# Patient Record
Sex: Female | Born: 1965 | Race: White | Hispanic: No | Smoking: Never smoker
Health system: Southern US, Community
[De-identification: ages and names within clinical notes are randomized; demographics above are authoritative.]

## PROBLEM LIST (undated history)

## (undated) DIAGNOSIS — K219 Gastro-esophageal reflux disease without esophagitis: Secondary | ICD-10-CM

## (undated) DIAGNOSIS — A0472 Enterocolitis due to Clostridium difficile, not specified as recurrent: Secondary | ICD-10-CM

## (undated) DIAGNOSIS — M419 Scoliosis, unspecified: Secondary | ICD-10-CM

## (undated) DIAGNOSIS — M1990 Unspecified osteoarthritis, unspecified site: Secondary | ICD-10-CM

## (undated) DIAGNOSIS — K449 Diaphragmatic hernia without obstruction or gangrene: Secondary | ICD-10-CM

## (undated) DIAGNOSIS — K5790 Diverticulosis of intestine, part unspecified, without perforation or abscess without bleeding: Secondary | ICD-10-CM

## (undated) DIAGNOSIS — F514 Sleep terrors [night terrors]: Secondary | ICD-10-CM

## (undated) HISTORY — DX: Unspecified osteoarthritis, unspecified site: M19.90

## (undated) HISTORY — PX: SPINE SURGERY: SHX786

## (undated) HISTORY — DX: Diaphragmatic hernia without obstruction or gangrene: K44.9

## (undated) HISTORY — DX: Enterocolitis due to Clostridium difficile, not specified as recurrent: A04.72

## (undated) HISTORY — DX: Scoliosis, unspecified: M41.9

## (undated) HISTORY — DX: Gastro-esophageal reflux disease without esophagitis: K21.9

## (undated) HISTORY — DX: Diverticulosis of intestine, part unspecified, without perforation or abscess without bleeding: K57.90

## (undated) HISTORY — DX: Sleep terrors (night terrors): F51.4

## (undated) HISTORY — PX: OTHER SURGICAL HISTORY: SHX169

---
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
## 1985-09-18 HISTORY — PX: APPENDECTOMY: SHX54

## 1985-09-18 HISTORY — PX: ABDOMINAL HYSTERECTOMY: SHX81

## 1985-09-18 HISTORY — PX: OTHER SURGICAL HISTORY: SHX169

## 2001-09-18 DIAGNOSIS — A0472 Enterocolitis due to Clostridium difficile, not specified as recurrent: Secondary | ICD-10-CM

## 2001-09-18 HISTORY — DX: Enterocolitis due to Clostridium difficile, not specified as recurrent: A04.72

## 2006-02-07 ENCOUNTER — Other Ambulatory Visit: Admission: RE | Admit: 2006-02-07 | Discharge: 2006-02-07 | Payer: Self-pay | Admitting: Obstetrics & Gynecology

## 2006-03-27 ENCOUNTER — Encounter: Admission: RE | Admit: 2006-03-27 | Discharge: 2006-03-27 | Payer: Self-pay | Admitting: Obstetrics & Gynecology

## 2006-04-09 ENCOUNTER — Encounter: Admission: RE | Admit: 2006-04-09 | Discharge: 2006-04-09 | Payer: Self-pay | Admitting: Obstetrics & Gynecology

## 2007-02-12 ENCOUNTER — Other Ambulatory Visit: Admission: RE | Admit: 2007-02-12 | Discharge: 2007-02-12 | Payer: Self-pay | Admitting: Obstetrics and Gynecology

## 2007-02-18 ENCOUNTER — Encounter: Admission: RE | Admit: 2007-02-18 | Discharge: 2007-02-18 | Payer: Self-pay | Admitting: Obstetrics & Gynecology

## 2007-07-31 ENCOUNTER — Encounter: Admission: RE | Admit: 2007-07-31 | Discharge: 2007-07-31 | Payer: Self-pay | Admitting: Obstetrics & Gynecology

## 2008-01-14 ENCOUNTER — Other Ambulatory Visit: Admission: RE | Admit: 2008-01-14 | Discharge: 2008-01-14 | Payer: Self-pay | Admitting: Obstetrics & Gynecology

## 2008-08-03 ENCOUNTER — Encounter: Admission: RE | Admit: 2008-08-03 | Discharge: 2008-08-03 | Payer: Self-pay | Admitting: Obstetrics & Gynecology

## 2009-08-04 ENCOUNTER — Encounter: Admission: RE | Admit: 2009-08-04 | Discharge: 2009-08-04 | Payer: Self-pay | Admitting: Obstetrics & Gynecology

## 2010-08-08 ENCOUNTER — Encounter: Admission: RE | Admit: 2010-08-08 | Discharge: 2010-08-08 | Payer: Self-pay | Admitting: Obstetrics & Gynecology

## 2011-07-03 ENCOUNTER — Other Ambulatory Visit: Payer: Self-pay | Admitting: Obstetrics & Gynecology

## 2011-07-03 DIAGNOSIS — Z1231 Encounter for screening mammogram for malignant neoplasm of breast: Secondary | ICD-10-CM

## 2011-08-14 ENCOUNTER — Ambulatory Visit
Admission: RE | Admit: 2011-08-14 | Discharge: 2011-08-14 | Disposition: A | Discharge status: Home / Self Care | Payer: BC Managed Care – PPO | Source: Ambulatory Visit | Attending: Obstetrics & Gynecology | Admitting: Obstetrics & Gynecology

## 2011-08-14 DIAGNOSIS — Z1231 Encounter for screening mammogram for malignant neoplasm of breast: Secondary | ICD-10-CM

## 2012-07-10 ENCOUNTER — Other Ambulatory Visit: Payer: Self-pay | Admitting: Obstetrics & Gynecology

## 2012-07-10 DIAGNOSIS — Z1231 Encounter for screening mammogram for malignant neoplasm of breast: Secondary | ICD-10-CM

## 2012-08-19 ENCOUNTER — Ambulatory Visit
Admission: RE | Admit: 2012-08-19 | Discharge: 2012-08-19 | Disposition: A | Discharge status: Home / Self Care | Payer: BC Managed Care – PPO | Source: Ambulatory Visit | Attending: Obstetrics & Gynecology | Admitting: Obstetrics & Gynecology

## 2012-08-19 DIAGNOSIS — Z1231 Encounter for screening mammogram for malignant neoplasm of breast: Secondary | ICD-10-CM

## 2013-02-01 ENCOUNTER — Other Ambulatory Visit: Payer: Self-pay | Admitting: Obstetrics and Gynecology

## 2013-02-03 NOTE — Telephone Encounter (Signed)
eScribe request for refill on ENPRESS 28 Last filled - 04/03/12, #28 X 10 Last AEX - 05/01/12 Next AEX - not scheduled.  Due in 04/2013 Pt given option at last AEX for 1-2 yr AEX appt.  Per lab report, if pt is continuing OCP she will need AEX appt at one year mark.   Two packs sent to pharmacy for June and July.

## 2013-03-31 ENCOUNTER — Other Ambulatory Visit: Payer: Self-pay | Admitting: Obstetrics & Gynecology

## 2013-03-31 NOTE — Telephone Encounter (Signed)
next aex on 05/15/2013 with MSM. Enpresse-28 tablet/1 refill sent to pharmacy to maintain coverage until aex.

## 2013-03-31 NOTE — Telephone Encounter (Signed)
OK to refill

## 2013-05-15 ENCOUNTER — Encounter: Payer: Self-pay | Admitting: Obstetrics & Gynecology

## 2013-05-15 ENCOUNTER — Ambulatory Visit (INDEPENDENT_AMBULATORY_CARE_PROVIDER_SITE_OTHER): Payer: BC Managed Care – PPO | Admitting: Obstetrics & Gynecology

## 2013-05-15 VITALS — BP 104/64 | HR 64 | Resp 16 | Ht 70.25 in | Wt 189.2 lb

## 2013-05-15 DIAGNOSIS — Z0000 Encounter for general adult medical examination without abnormal findings: Secondary | ICD-10-CM

## 2013-05-15 DIAGNOSIS — Z01419 Encounter for gynecological examination (general) (routine) without abnormal findings: Secondary | ICD-10-CM

## 2013-05-15 LAB — POCT URINALYSIS DIPSTICK
Blood, UA: NEGATIVE
Protein, UA: NEGATIVE
Urobilinogen, UA: NEGATIVE

## 2013-05-15 MED ORDER — LEVONORG-ETH ESTRAD TRIPHASIC PO TABS: ORAL_TABLET | ORAL | Status: DC

## 2013-05-15 NOTE — Patient Instructions (Signed)

## 2013-05-15 NOTE — Progress Notes (Signed)
47 y.o. G2P2 Married CaucasianF here for annual exam.  Regular cycles.  3D MMG discussed.    Patient's last menstrual period was 05/01/2013.          Sexually active: yes  The current method of family planning is OCP (estrogen/progesterone).    Exercising: no  not regularly Smoker:  no  Health Maintenance: Pap:  05/01/12 WNL/negatve HR HPV History of abnormal Pap:  no MMG:  08/19/12 normal Colonoscopy:  none BMD:   none TDaP:  ? Screening Labs: 2010, Hb today: not drawn yet, Urine today: WBC-trace   reports that she has never smoked. She has never used smokeless tobacco. She reports that she drinks about 1.0 ounces of alcohol per week. She reports that she does not use illicit drugs.  Past Medical History  Diagnosis Date  . Scoliosis     Past Surgical History  Procedure Laterality Date  . Herrington rods  1980  . Rso  1987    Current Outpatient Prescriptions  Medication Sig Dispense Refill  . levonorgestrel-ethinyl estradiol (ENPRESSE-28) tablet TAKE 1 TABLET BY MOUTH EVERY DAY  28 tablet  1   No current facility-administered medications for this visit.    Family History  Problem Relation Age of Onset  . Diabetes Maternal Grandfather   . Lung cancer Maternal Grandmother   . Breast cancer Mother 62    mastectomy  . Skin cancer Brother     not melanoma  . Hypothyroidism Mother     ROS:  Pertinent items are noted in HPI.  Otherwise, a comprehensive ROS was negative.  Exam:   BP 104/64  Pulse 64  Resp 16  Ht 5' 10.25" (1.784 m)  Wt 189 lb 3.2 oz (85.821 kg)  BMI 26.97 kg/m2  LMP 05/01/2013  Weight change: +7lbs  Height: 5' 10.25" (178.4 cm)  Ht Readings from Last 3 Encounters:  05/15/13 5' 10.25" (1.784 m)    General appearance: alert, cooperative and appears stated age Head: Normocephalic, without obvious abnormality, atraumatic Neck: no adenopathy, supple, symmetrical, trachea midline and thyroid normal to inspection and palpation Lungs: clear to  auscultation bilaterally Breasts: normal appearance, no masses or tenderness Heart: regular rate and rhythm Abdomen: soft, non-tender; bowel sounds normal; no masses,  no organomegaly Extremities: extremities normal, atraumatic, no cyanosis or edema Skin: Skin color, texture, turgor normal. No rashes or lesions Lymph nodes: Cervical, supraclavicular, and axillary nodes normal. No abnormal inguinal nodes palpated Neurologic: Grossly normal   Pelvic: External genitalia:  no lesions              Urethra:  normal appearing urethra with no masses, tenderness or lesions              Bartholins and Skenes: normal                 Vagina: normal appearing vagina with normal color and discharge, no lesions              Cervix: no lesions              Pap taken: no Bimanual Exam:  Uterus:  normal size, contour, position, consistency, mobility, non-tender              Adnexa: normal adnexa and no mass, fullness, tenderness               Rectovaginal: Confirms               Anus:  normal sphincter tone, no lesions  A:  Well Woman with normal exam On OCPs Family hx of breast cancer (mother)  P:   Mammogram yearly.  D/W 3D MMG. Unsure about Tdap.  Offered.  Declined for now.  pap smear with neg HR HPV 8/13. Rx for Enpresse for 1 year. return annually or prn  An After Visit Summary was printed and given to the patient.

## 2013-07-07 ENCOUNTER — Telehealth: Payer: Self-pay | Admitting: Obstetrics & Gynecology

## 2013-07-07 ENCOUNTER — Other Ambulatory Visit: Payer: Self-pay

## 2013-07-07 DIAGNOSIS — Z1231 Encounter for screening mammogram for malignant neoplasm of breast: Secondary | ICD-10-CM

## 2013-07-07 NOTE — Telephone Encounter (Signed)
Patient is calling in reference to a receipt she has for her aex on 8/28. She wanted to see if her receipt documented a pap smear. I explained that she did not have a pap smear when she was here for her AEX. She wants to verify this with billing.

## 2013-08-25 ENCOUNTER — Ambulatory Visit
Admission: RE | Admit: 2013-08-25 | Discharge: 2013-08-25 | Disposition: A | Discharge status: Home / Self Care | Payer: BC Managed Care – PPO | Source: Ambulatory Visit

## 2013-08-25 DIAGNOSIS — Z1231 Encounter for screening mammogram for malignant neoplasm of breast: Secondary | ICD-10-CM

## 2014-06-01 ENCOUNTER — Encounter: Payer: Self-pay | Admitting: Obstetrics & Gynecology

## 2014-06-01 ENCOUNTER — Ambulatory Visit (INDEPENDENT_AMBULATORY_CARE_PROVIDER_SITE_OTHER): Payer: BC Managed Care – PPO | Admitting: Obstetrics & Gynecology

## 2014-06-01 VITALS — BP 112/68 | HR 64 | Resp 16 | Ht 70.25 in | Wt 189.4 lb

## 2014-06-01 DIAGNOSIS — Z01419 Encounter for gynecological examination (general) (routine) without abnormal findings: Secondary | ICD-10-CM

## 2014-06-01 DIAGNOSIS — Z124 Encounter for screening for malignant neoplasm of cervix: Secondary | ICD-10-CM

## 2014-06-01 DIAGNOSIS — Z0000 Encounter for general adult medical examination without abnormal findings: Secondary | ICD-10-CM

## 2014-06-01 LAB — POCT URINALYSIS DIPSTICK
BILIRUBIN UA: NEGATIVE
GLUCOSE UA: NEGATIVE
Ketones, UA: NEGATIVE
LEUKOCYTES UA: NEGATIVE
NITRITE UA: NEGATIVE
Protein, UA: NEGATIVE
UROBILINOGEN UA: NEGATIVE
pH, UA: 5

## 2014-06-01 LAB — HEMOGLOBIN, FINGERSTICK: Hemoglobin, fingerstick: 13.7 g/dL (ref 12.0–16.0)

## 2014-06-01 LAB — TSH: TSH: 0.632 u[IU]/mL (ref 0.350–4.500)

## 2014-06-01 MED ORDER — LEVONORG-ETH ESTRAD TRIPHASIC PO TABS: ORAL_TABLET | ORAL | Status: DC

## 2014-06-01 NOTE — Progress Notes (Signed)
48 y.o. G2P2 Married CaucasianF here for annual exam.  Doing well.  Cycles regular.  Flow lasts 4-5.  Pt reports she had "emergency root canal" and was on several antibiotics after the root canal.  Eating Activia.    Patient's last menstrual period was 05/28/2014.          Sexually active: Yes.    The current method of family planning is OCP (estrogen/progesterone). Exercising: Yes.    walking Smoker:  no  Health Maintenance: Pap:  05/01/12 WNL/negative HR HPV History of abnormal Pap:  no MMG:  08/25/13-normal Colonoscopy:  none BMD:   none TDaP:  ? Screening Labs: done today, Hb today: 13.7, Urine today: RBC-2+   reports that she has never smoked. She has never used smokeless tobacco. She reports that she drinks about .5 - 1 ounces of alcohol per week. She reports that she does not use illicit drugs.  Past Medical History  Diagnosis Date  . Scoliosis     Past Surgical History  Procedure Laterality Date  . Herrington rods  1980  . Rso  1987    Current Outpatient Prescriptions  Medication Sig Dispense Refill  . levonorgestrel-ethinyl estradiol (ENPRESSE-28) tablet TAKE 1 TABLET BY MOUTH EVERY DAY  3 Package  4   No current facility-administered medications for this visit.    Family History  Problem Relation Age of Onset  . Diabetes Maternal Grandfather   . Lung cancer Maternal Grandmother   . Breast cancer Mother 36    mastectomy  . Skin cancer Brother     not melanoma  . Hypothyroidism Mother     ROS:  Pertinent items are noted in HPI.  Otherwise, a comprehensive ROS was negative.  Exam:   BP 112/68  Pulse 64  Resp 16  Ht 5' 10.25" (1.784 m)  Wt 189 lb 6.4 oz (85.911 kg)  BMI 26.99 kg/m2  LMP 05/28/2014   Height: 5' 10.25" (178.4 cm)  Ht Readings from Last 3 Encounters:  06/01/14 5' 10.25" (1.784 m)  05/15/13 5' 10.25" (1.784 m)    General appearance: alert, cooperative and appears stated age Head: Normocephalic, without obvious abnormality,  atraumatic Neck: no adenopathy, supple, symmetrical, trachea midline and thyroid normal to inspection and palpation Lungs: clear to auscultation bilaterally Breasts: normal appearance, no masses or tenderness Heart: regular rate and rhythm Abdomen: soft, non-tender; bowel sounds normal; no masses,  no organomegaly Extremities: extremities normal, atraumatic, no cyanosis or edema Skin: Skin color, texture, turgor normal. No rashes or lesions Lymph nodes: Cervical, supraclavicular, and axillary nodes normal. No abnormal inguinal nodes palpated Neurologic: Grossly normal   Pelvic: External genitalia:  no lesions              Urethra:  normal appearing urethra with no masses, tenderness or lesions              Bartholins and Skenes: normal                 Vagina: normal appearing vagina with normal color and discharge, no lesions              Cervix: no lesions              Pap taken: Yes.   Bimanual Exam:  Uterus:  normal size, contour, position, consistency, mobility, non-tender              Adnexa: normal adnexa and no mass, fullness, tenderness  Rectovaginal: Confirms               Anus:  normal sphincter tone, no lesions  A:  Well Woman with normal exam  On OCPs  Family hx of breast cancer (mother)   P: Mammogram yearly. D/W 3D MMG.  Unsure about Tdap. Offered. Declined for now.  pap smear with neg HR HPV 8/13.  Pap only today Rx for Enpresse for 1 year.  CMP, Lipids, TSH, Vit D today return annually or prn   An After Visit Summary was printed and given to the patient.

## 2014-06-02 LAB — LIPID PANEL
Cholesterol: 182 mg/dL (ref 0–200)
HDL: 86 mg/dL (ref >39)
LDL CALC: 76 mg/dL (ref 0–99)
TRIGLYCERIDES: 101 mg/dL (ref <150)
Total CHOL/HDL Ratio: 2.1 Ratio
VLDL: 20 mg/dL (ref 0–40)

## 2014-06-02 LAB — COMPREHENSIVE METABOLIC PANEL
ALK PHOS: 49 U/L (ref 39–117)
ALT: 17 U/L (ref 0–35)
AST: 14 U/L (ref 0–37)
Albumin: 4.5 g/dL (ref 3.5–5.2)
BILIRUBIN TOTAL: 0.7 mg/dL (ref 0.2–1.2)
BUN: 15 mg/dL (ref 6–23)
CO2: 27 mEq/L (ref 19–32)
Calcium: 9 mg/dL (ref 8.4–10.5)
Chloride: 105 mEq/L (ref 96–112)
Creat: 0.84 mg/dL (ref 0.50–1.10)
GLUCOSE: 100 mg/dL — AB (ref 70–99)
POTASSIUM: 4.1 meq/L (ref 3.5–5.3)
SODIUM: 143 meq/L (ref 135–145)
TOTAL PROTEIN: 6.2 g/dL (ref 6.0–8.3)

## 2014-06-02 LAB — VITAMIN D 25 HYDROXY (VIT D DEFICIENCY, FRACTURES): Vit D, 25-Hydroxy: 58 ng/mL (ref 30–89)

## 2014-06-03 LAB — IPS PAP TEST WITH REFLEX TO HPV

## 2014-06-15 ENCOUNTER — Telehealth: Payer: Self-pay | Admitting: Obstetrics & Gynecology

## 2014-06-15 NOTE — Telephone Encounter (Signed)
Spoke with patient. Patient is doing a questionnaire for open enrollment. Patient would like to know blood pressure, cholesterol levels, and glucose level. Advised patient blood pressure was 112/68. Cholesterol was 182, LDL was 76, HDL was 86, triglycerides were 101 and glucose was 100. Patient is agreeable and verbalizes understanding.  Routing to provider for final review. Patient agreeable to disposition. Will close encounter

## 2014-06-15 NOTE — Telephone Encounter (Signed)
Pt would like nurse to call her and give her the reading of her blood pressure reading from previous visit.

## 2014-07-20 ENCOUNTER — Encounter: Payer: Self-pay | Admitting: Obstetrics & Gynecology

## 2014-08-10 ENCOUNTER — Other Ambulatory Visit: Payer: Self-pay

## 2014-08-10 DIAGNOSIS — Z1231 Encounter for screening mammogram for malignant neoplasm of breast: Secondary | ICD-10-CM

## 2014-08-28 ENCOUNTER — Ambulatory Visit: Payer: BC Managed Care – PPO

## 2014-09-23 ENCOUNTER — Ambulatory Visit
Admission: RE | Admit: 2014-09-23 | Discharge: 2014-09-23 | Disposition: A | Discharge status: Home / Self Care | Payer: BC Managed Care – PPO | Source: Ambulatory Visit

## 2014-09-23 DIAGNOSIS — Z1231 Encounter for screening mammogram for malignant neoplasm of breast: Secondary | ICD-10-CM

## 2015-06-02 ENCOUNTER — Other Ambulatory Visit: Payer: Self-pay | Admitting: Obstetrics & Gynecology

## 2015-06-02 NOTE — Telephone Encounter (Signed)
Medication refill request: OCP Last AEX:  06/01/14 SM Next AEX: 07/06/15 SM  Last MMG (if hormonal medication request): 09/24/14 BIRADS1:neg Refill authorized: 06/01/14 #3pack/ [facility]R. [facility] [place].  Today #84/0R?

## 2015-06-23 ENCOUNTER — Telehealth: Payer: Self-pay | Admitting: Obstetrics & Gynecology

## 2015-06-23 NOTE — Telephone Encounter (Signed)
Spoke with patient. Patient is filling out forms for her job and needs blood pressure, weight, and lab work. Would like to pick this up from the office. OV notes and labs from 06/01/2014 printed and placed up front for patient pick up. Patient is agreeable.  Routing to provider for final review. Patient agreeable to disposition. Will close encounter.

## 2015-06-23 NOTE — Telephone Encounter (Signed)
Left message regarding upcoming appointment has been canceled and needs to be rescheduled. °

## 2015-06-23 NOTE — Telephone Encounter (Signed)
Patient would like to get the readings of her blood pressure and weight for insurance purposes for job.

## 2015-07-06 ENCOUNTER — Ambulatory Visit: Payer: BC Managed Care – PPO | Admitting: Obstetrics & Gynecology

## 2015-08-23 ENCOUNTER — Other Ambulatory Visit: Payer: Self-pay

## 2015-08-23 DIAGNOSIS — Z1231 Encounter for screening mammogram for malignant neoplasm of breast: Secondary | ICD-10-CM

## 2015-08-25 ENCOUNTER — Other Ambulatory Visit: Payer: Self-pay | Admitting: Obstetrics & Gynecology

## 2015-08-25 NOTE — Telephone Encounter (Signed)
Medication refill request: Enpresse Last AEX:  06-01-14 Next AEX: 09-07-15 Last MMG (if hormonal medication request): 09-23-14 Refill authorized: please advise

## 2015-09-07 ENCOUNTER — Ambulatory Visit (INDEPENDENT_AMBULATORY_CARE_PROVIDER_SITE_OTHER): Payer: BC Managed Care – PPO | Admitting: Obstetrics & Gynecology

## 2015-09-07 ENCOUNTER — Encounter: Payer: Self-pay | Admitting: Obstetrics & Gynecology

## 2015-09-07 VITALS — BP 106/60 | HR 68 | Resp 18 | Ht 70.25 in | Wt 197.0 lb

## 2015-09-07 DIAGNOSIS — Z1211 Encounter for screening for malignant neoplasm of colon: Secondary | ICD-10-CM | POA: Diagnosis not present

## 2015-09-07 DIAGNOSIS — Z23 Encounter for immunization: Secondary | ICD-10-CM | POA: Diagnosis not present

## 2015-09-07 DIAGNOSIS — Z01419 Encounter for gynecological examination (general) (routine) without abnormal findings: Secondary | ICD-10-CM

## 2015-09-07 MED ORDER — LEVONORGESTREL-ETHINYL ESTRAD 0.1-20 MG-MCG PO TABS: 1.0000 | ORAL_TABLET | Freq: Every day | ORAL | Status: DC

## 2015-09-07 NOTE — Progress Notes (Signed)
49 y.o. G2P2 SingleCaucasianF here for annual exam.  Doing well.  Still cycling.  On OCPs.  D/W pt lowering her estrogen dosage.  Pt comfortable with this.    Patient's last menstrual period was 08/26/2015.          Sexually active: Yes.    The current method of family planning is OCP (estrogen/progesterone).    Exercising: No.  The patient does not participate in regular exercise at present. Smoker:  no  Health Maintenance: Pap:  06/01/14 Neg, 05/01/12 neg pap with neg HR HPV History of abnormal Pap:  no MMG:  09/24/14 BIRADS1:neg.  Has appt 09/27/15  Colonoscopy:  Never BMD:   Never TDaP:  ?, will give this today.   Screening Labs: , Hb today: , Urine today: will discuss with provider first    reports that she has never smoked. She has never used smokeless tobacco. She reports that she drinks about 0.5 - 1.0 oz of alcohol per week. She reports that she does not use illicit drugs.  Past Medical History  Diagnosis Date  . Scoliosis   . Clostridium difficile colitis 2003    Past Surgical History  Procedure Laterality Date  . Herrington rods  1980  . Rso  1987    Current Outpatient Prescriptions  Medication Sig Dispense Refill  . ENPRESSE-28 tablet TAKE 1 TABLET BY MOUTH EVERY DAY 84 tablet 0   No current facility-administered medications for this visit.    Family History  Problem Relation Age of Onset  . Diabetes Maternal Grandfather   . Lung cancer Maternal Grandmother   . Breast cancer Mother 33    mastectomy  . Skin cancer Brother     not melanoma  . Hypothyroidism Mother     ROS:  Pertinent items are noted in HPI.  Otherwise, a comprehensive ROS was negative.  Exam:   BP 106/60 mmHg  Pulse 68  Resp 18  Ht 5' 10.25" (1.784 m)  Wt 197 lb (89.359 kg)  BMI 28.08 kg/m2  LMP 08/26/2015  Weight change: +8#  Height: 5' 10.25" (178.4 cm)  Ht Readings from Last 3 Encounters:  09/07/15 5' 10.25" (1.784 m)  06/01/14 5' 10.25" (1.784 m)  05/15/13 5' 10.25" (1.784 m)     General appearance: alert, cooperative and appears stated age Head: Normocephalic, without obvious abnormality, atraumatic Neck: no adenopathy, supple, symmetrical, trachea midline and thyroid normal to inspection and palpation Lungs: clear to auscultation bilaterally Breasts: normal appearance, no masses or tenderness Heart: regular rate and rhythm Abdomen: soft, non-tender; bowel sounds normal; no masses,  no organomegaly Extremities: extremities normal, atraumatic, no cyanosis or edema Skin: Skin color, texture, turgor normal. No rashes or lesions Lymph nodes: Cervical, supraclavicular, and axillary nodes normal. No abnormal inguinal nodes palpated Neurologic: Grossly normal   Pelvic: External genitalia:  no lesions              Urethra:  normal appearing urethra with no masses, tenderness or lesions              Bartholins and Skenes: normal                 Vagina: normal appearing vagina with normal color and discharge, no lesions              Cervix: no lesions              Pap taken: No. Bimanual Exam:  Uterus:  normal size, contour, position, consistency, mobility, non-tender  Adnexa: normal adnexa and no mass, fullness, tenderness               Rectovaginal: Confirms               Anus:  normal sphincter tone, no lesions  Chaperone was present for exam.  A:  Well Woman with normal exam  On OCPs  Family hx of breast cancer (mother)   P: Mammogram yearly. D/W 3D MMG.  Tdap today pap smear with neg HR HPV 8/13. Pap neg 2015.  No pap today Will decrease estrogen to 63mcg dosage.  Will switch to Alesse daily.  #9month supply/4RF. No labs today.  Will plan Jette next year with AEX. Colonoscopy referral made. return annually or prn

## 2015-09-27 ENCOUNTER — Inpatient Hospital Stay: Admission: RE | Admit: 2015-09-27 | Payer: BC Managed Care – PPO | Source: Ambulatory Visit

## 2015-10-18 ENCOUNTER — Ambulatory Visit
Admission: RE | Admit: 2015-10-18 | Discharge: 2015-10-18 | Disposition: A | Discharge status: Home / Self Care | Payer: BC Managed Care – PPO | Source: Ambulatory Visit

## 2015-10-18 DIAGNOSIS — Z1231 Encounter for screening mammogram for malignant neoplasm of breast: Secondary | ICD-10-CM

## 2015-12-13 ENCOUNTER — Telehealth: Payer: Self-pay | Admitting: Obstetrics & Gynecology

## 2015-12-13 NOTE — Telephone Encounter (Signed)
Patient says her pharmacy says she has no refills left. According to chart [name] last refilled 09/07/2015 #3 packs/4 rfs. [facility] she confirmed that they do have the rx and it was on hold. They processed refill for patient and it should be ready after lunch. Called patient she is aware.  Routed to provider for review, encounter closed.

## 2016-05-24 ENCOUNTER — Other Ambulatory Visit: Payer: Self-pay | Admitting: Obstetrics & Gynecology

## 2016-05-24 NOTE — Telephone Encounter (Signed)
Medication refill request: Enpresse Last AEX:  09/07/15 SM Next AEX: 12/29/16 SM Last MMG (if hormonal medication request): 10/18/15 BIRADS1  Refill authorized: 08/25/15. #84 0R. Please advise. Thank you.

## 2016-09-13 ENCOUNTER — Other Ambulatory Visit: Payer: Self-pay | Admitting: Obstetrics & Gynecology

## 2016-09-13 DIAGNOSIS — Z1231 Encounter for screening mammogram for malignant neoplasm of breast: Secondary | ICD-10-CM

## 2016-10-09 ENCOUNTER — Telehealth: Payer: Self-pay | Admitting: Obstetrics & Gynecology

## 2016-10-09 DIAGNOSIS — N939 Abnormal uterine and vaginal bleeding, unspecified: Secondary | ICD-10-CM

## 2016-10-09 NOTE — Telephone Encounter (Addendum)
Spoke with patient. Patient states she is experiencing some breakthrough bleeding with this months cycle. Patient states she started bleeding 1/19 and should not be due to start until end of this week. Patient reports bleeding as light. Patient states she has been on OCP Alesse since March 2017. Patient states cycles have been regular. Patient crying, patient states husband died last year unexpectedly and wonders if bleeding could be stress related? Patient denies any missed pills. Patient states she still takes care of her children and does not want anything to happen to her. Last AEX 09/07/15 and next AEX 12/29/16 -recommended moving AEX up, patient scheduled for 10/16/16 at 3pm with Dr. Sabra Heck. Advised patient will review with Dr. Sabra Heck and return call with any additional recommendations, patient is agreeable.  Dr. Sabra Heck -any additional  Recommendations?  Routing to provider for final review. Patient is agreeable to disposition. Will close encounter.

## 2016-10-09 NOTE — Telephone Encounter (Signed)
Patient having break through bleeding on her birth control pills and wants to speak with a nurse about it.

## 2016-10-09 NOTE — Telephone Encounter (Signed)
Left message to call Jerry Haugen at [phone].  

## 2016-10-10 ENCOUNTER — Ambulatory Visit: Payer: Self-pay | Admitting: Obstetrics & Gynecology

## 2016-10-10 NOTE — Telephone Encounter (Signed)
Is there anyway to do her AEX on a day with ultrasound and do a PUS at the same time?  That way I can look at the endometrium and make sure she does not need any other tests.  Thanks.

## 2016-10-11 NOTE — Addendum Note (Signed)
Addended by: Burnice Logan on: 10/11/2016 09:31 AM   Modules accepted: Orders

## 2016-10-11 NOTE — Telephone Encounter (Signed)
Spoke with patient, advised as seen below per Dr. Sabra Heck. Patient scheduled for PUS on 2/1 at 2:30pm and consult with AEX appointment to follow with Dr. Sabra Heck at 3pm. Patient verbalizes understanding and is agreeable to date and time. Advised patient previous appt for 1/29 has been cancelled.    Routing to provider for final review. Patient is agreeable to disposition. Will close encounter.  Cc: Lerry Liner

## 2016-10-16 ENCOUNTER — Ambulatory Visit: Payer: Self-pay | Admitting: Obstetrics & Gynecology

## 2016-10-19 ENCOUNTER — Encounter: Payer: Self-pay | Admitting: Obstetrics & Gynecology

## 2016-10-19 ENCOUNTER — Ambulatory Visit (INDEPENDENT_AMBULATORY_CARE_PROVIDER_SITE_OTHER): Payer: BC Managed Care – PPO | Admitting: Obstetrics & Gynecology

## 2016-10-19 ENCOUNTER — Ambulatory Visit (INDEPENDENT_AMBULATORY_CARE_PROVIDER_SITE_OTHER): Payer: BC Managed Care – PPO

## 2016-10-19 VITALS — BP 110/76 | HR 96 | Resp 14 | Ht 71.0 in | Wt 201.0 lb

## 2016-10-19 DIAGNOSIS — Z124 Encounter for screening for malignant neoplasm of cervix: Secondary | ICD-10-CM

## 2016-10-19 DIAGNOSIS — Z1211 Encounter for screening for malignant neoplasm of colon: Secondary | ICD-10-CM | POA: Diagnosis not present

## 2016-10-19 DIAGNOSIS — Z01419 Encounter for gynecological examination (general) (routine) without abnormal findings: Secondary | ICD-10-CM | POA: Diagnosis not present

## 2016-10-19 DIAGNOSIS — N938 Other specified abnormal uterine and vaginal bleeding: Secondary | ICD-10-CM

## 2016-10-19 DIAGNOSIS — Z9189 Other specified personal risk factors, not elsewhere classified: Secondary | ICD-10-CM

## 2016-10-19 DIAGNOSIS — N951 Menopausal and female climacteric states: Secondary | ICD-10-CM

## 2016-10-19 DIAGNOSIS — Z0000 Encounter for general adult medical examination without abnormal findings: Secondary | ICD-10-CM

## 2016-10-19 DIAGNOSIS — N939 Abnormal uterine and vaginal bleeding, unspecified: Secondary | ICD-10-CM | POA: Diagnosis not present

## 2016-10-19 LAB — CBC
HCT: 42.6 % (ref 35.0–45.0)
Hemoglobin: 13.8 g/dL (ref 11.7–15.5)
MCH: 28.7 pg (ref 27.0–33.0)
MCHC: 32.4 g/dL (ref 32.0–36.0)
MCV: 88.6 fL (ref 80.0–100.0)
MPV: 11.1 fL (ref 7.5–12.5)
PLATELETS: 253 10*3/uL (ref 140–400)
RBC: 4.81 MIL/uL (ref 3.80–5.10)
RDW: 12.8 % (ref 11.0–15.0)
WBC: 7 10*3/uL (ref 3.8–10.8)

## 2016-10-19 LAB — TSH: TSH: 0.82 mIU/L

## 2016-10-19 MED ORDER — LEVONORGESTREL-ETHINYL ESTRAD 0.1-20 MG-MCG PO TABS: 1.0000 | ORAL_TABLET | Freq: Every day | ORAL | 4 refills | Status: DC

## 2016-10-19 NOTE — Progress Notes (Signed)
Documentation regarding ultrasound made on additional noted dated same day.

## 2016-10-19 NOTE — Progress Notes (Signed)
51 y.o. G2P2 SingleCaucasianF here for annual exam.  Husband died of massive MI last year.  She is appropriately sad, frustrated, and scared about her own health.  Family is all in and around [place].  Two kids, 12 and 15, are doing "ok".  Pt called with complaint of BTB and anxiety regarding this on 10/09/16.  Offered PUS day of AEX to help pt be more reassured.  Has not had spotting or irregular bleeding in years.  Feels like she didn't miss any pills.    Reports her sleep is ok.  Nightmares have stopped.  Sleeping with lights on now and she didn't used to do that.  Feels this is "silly".  Pt is in grief counseling and finding this very helpful.  Wishes she were much closer to family.  Not prepared to make any moves at this point with kids in school.  Would like referral to new PCP in [place].  Would like someone "who won't be gone in a year".  Pt asked aloud if she should see a cardiologist.  Husband's death was "out of the blue" but his mother did have an MI when she was 89 and his Maternal GF had a stroke in his 72's.  Pt has no family hx of cardiac disease.  Patient's last menstrual period was 10/07/2016.          Sexually active: No.  The current method of family planning is OCP (estrogen/progesterone).    Exercising: Yes.    walking Smoker:  no  Health Maintenance: Pap:  06/01/14 negative  History of abnormal Pap:  no MMG:  10/18/15 BIRADS 1 negative  Colonoscopy:  never BMD:   never TDaP:  09/07/15  Pneumonia vaccine(s):  never Zostavax:   Never  Hep C testing: not indicated Screening Labs: discuss with provider, Hb today: same, Urine today: unable to void at this time   reports that she has never smoked. She has never used smokeless tobacco. She reports that she drinks about 0.5 - 1.0 oz of alcohol per week . She reports that she does not use drugs.  Past Medical History:  Diagnosis Date  . Clostridium difficile colitis 2003  . Scoliosis     Past Surgical History:  Procedure  Laterality Date  . Herrington rods  1980  . RSO  1987    Current Outpatient Prescriptions  Medication Sig Dispense Refill  . levonorgestrel-ethinyl estradiol (AVIANE,ALESSE,LESSINA) 0.1-20 MG-MCG tablet Take 1 tablet by mouth daily. 3 Package 4   No current facility-administered medications for this visit.     Family History  Problem Relation Age of Onset  . Diabetes Maternal Grandfather   . Lung cancer Maternal Grandmother   . Breast cancer Mother 26    mastectomy  . Hypothyroidism Mother   . Skin cancer Brother     not melanoma    ROS:  Pertinent items are noted in HPI.  Otherwise, a comprehensive ROS was negative.  Exam:   BP 110/76 (BP Location: Right Arm, Patient Position: Sitting, Cuff Size: Normal)   Pulse 96   Resp 14   Ht 5\' 11"  (1.803 m)   Wt 201 lb (91.2 kg)   LMP 10/07/2016   BMI 28.03 kg/m     Height: 5\' 11"  (180.3 cm)  Ht Readings from Last 3 Encounters:  10/19/16 5\' 11"  (1.803 m)  09/07/15 5' 10.25" (1.784 m)  06/01/14 5' 10.25" (1.784 m)   General appearance: alert, cooperative and appears stated age Head: Normocephalic, without obvious abnormality,  atraumatic Neck: no adenopathy, supple, symmetrical, trachea midline and thyroid normal to inspection and palpation Lungs: clear to auscultation bilaterally Breasts: normal appearance, no masses or tenderness Heart: regular rate and rhythm Abdomen: soft, non-tender; bowel sounds normal; no masses,  no organomegaly Extremities: extremities normal, atraumatic, no cyanosis or edema Skin: Skin color, texture, turgor normal. No rashes or lesions Lymph nodes: Cervical, supraclavicular, and axillary nodes normal. No abnormal inguinal nodes palpated Neurologic: Grossly normal   Pelvic: External genitalia:  no lesions              Urethra:  normal appearing urethra with no masses, tenderness or lesions              Bartholins and Skenes: normal                 Vagina: normal appearing vagina with normal color  and discharge, no lesions              Cervix: no lesions              Pap taken: Yes.   Bimanual Exam:  Uterus:  normal size, contour, position, consistency, mobility, non-tender              Adnexa: normal adnexa and no mass, fullness, tenderness               Rectovaginal: Confirms               Anus:  normal sphincter tone, no lesions  Chaperone was present for exam.  PUS Uterus:  6.1 x 3.4 x 3.0cm Endometrium 5.22mm, symmetric, peripheral calcifications noted Left ovary 2.3 x 1.1 x 0.8cm Right ovary surgically absent Cul de sac:  No free fluid  Findings reviewed with pt.  As DUB seems to be an isolated even, do not thing additional evaluation is needed at this time.  Pt comfortable with plan but knows to call if has new or continued issues.  A:  Well Woman with normal exam  On OCPs for contraception  Family hx of breast cancer (mother)  Appropriate grief reaction due to husband's death in 2016/01/10 from MI Recent DUB  P:  Mammogram yearly.  Recommended 3D MMG.  She has not been doing these due to extra cost but reviewed insurance changes on this with pt Pap and HR HPV obtained today Rx for Alesse to pharmacy.  #3 month supply/4RF. Referral for colonoscopy made.  This was done last year but pt played "phone tag" with office and never scheduled after husband's death.  Ready to proceed now. Rx for Shingrix vaccine now and in six months given.  Pt knows to check insurance for coverage.  Advised may be age 75 despite FDA approval for >50. CBC, CMP, Lipids, TSH, Vit D obtained FSH obtained.  Will see if should continue OCPs at this this time.  Pt is anxious about any hormonal changes given events of last year. Will also help pt establish PCP this year return annually or prn  In addition to AEX, additional 15 minutes in face to face discussion spent reviewing ultrasound, possible perimenopausal changes, OCP use and when to stop, hormonal testing.

## 2016-10-20 LAB — COMPREHENSIVE METABOLIC PANEL
ALBUMIN: 4.3 g/dL (ref 3.6–5.1)
ALT: 15 U/L (ref 6–29)
AST: 15 U/L (ref 10–35)
Alkaline Phosphatase: 54 U/L (ref 33–130)
BILIRUBIN TOTAL: 0.6 mg/dL (ref 0.2–1.2)
BUN: 14 mg/dL (ref 7–25)
CO2: 24 mmol/L (ref 20–31)
CREATININE: 1.08 mg/dL — AB (ref 0.50–1.05)
Calcium: 9.3 mg/dL (ref 8.6–10.4)
Chloride: 105 mmol/L (ref 98–110)
GLUCOSE: 75 mg/dL (ref 65–99)
Potassium: 4.2 mmol/L (ref 3.5–5.3)
Sodium: 142 mmol/L (ref 135–146)
Total Protein: 6.4 g/dL (ref 6.1–8.1)

## 2016-10-20 LAB — VITAMIN D 25 HYDROXY (VIT D DEFICIENCY, FRACTURES): Vit D, 25-Hydroxy: 50 ng/mL (ref 30–100)

## 2016-10-20 LAB — LIPID PANEL
CHOL/HDL RATIO: 2 ratio (ref <5.0)
Cholesterol: 170 mg/dL (ref <200)
HDL: 84 mg/dL (ref >50)
LDL Cholesterol: 72 mg/dL (ref <100)
Triglycerides: 72 mg/dL (ref <150)
VLDL: 14 mg/dL (ref <30)

## 2016-10-20 LAB — FOLLICLE STIMULATING HORMONE: FSH: 37.3 m[IU]/mL

## 2016-10-25 LAB — IPS PAP TEST WITH HPV

## 2016-10-27 ENCOUNTER — Telehealth: Payer: Self-pay | Admitting: *Deleted

## 2016-10-27 NOTE — Telephone Encounter (Signed)
-----   Message from Megan Salon, MD sent at 10/26/2016  2:56 PM EST ----- 02 recall.  Please inform pt her pap was normal and the HPV testing was negative.

## 2016-10-27 NOTE — Telephone Encounter (Signed)
Call to patient. All results reviewed with patient and she verbalized understanding. Patient aware to stop pills and call with any future bleeding. Patient also states that she is okay to have creatinine level rechecked next year. Patient asking about referral for PCP. RN advised the referral has been placed and once processed, our referral coordinator, Jacqlyn Larsen, would call patient. Patient agreeable. Patient would also like to let Dr. Sabra Heck know that she is planning to schedule her colonoscopy in late June/early July due to working full time. Patient states she has to wait until April to schedule, but just wanted to make Dr. Sabra Heck aware she would be scheduling. RN advised this message would be sent to Dr. Sabra Heck for review. Patient agreeable.   Routing to provider for final review. Patient agreeable to disposition. Will close encounter.

## 2016-10-30 ENCOUNTER — Ambulatory Visit: Payer: BC Managed Care – PPO

## 2016-11-04 ENCOUNTER — Other Ambulatory Visit: Payer: Self-pay | Admitting: Obstetrics & Gynecology

## 2016-11-27 ENCOUNTER — Ambulatory Visit
Admission: RE | Admit: 2016-11-27 | Discharge: 2016-11-27 | Disposition: A | Discharge status: Home / Self Care | Payer: BC Managed Care – PPO | Source: Ambulatory Visit | Attending: Obstetrics & Gynecology | Admitting: Obstetrics & Gynecology

## 2016-11-27 ENCOUNTER — Ambulatory Visit: Payer: BC Managed Care – PPO

## 2016-11-27 DIAGNOSIS — Z1231 Encounter for screening mammogram for malignant neoplasm of breast: Secondary | ICD-10-CM

## 2016-12-11 ENCOUNTER — Telehealth: Payer: Self-pay | Admitting: Obstetrics & Gynecology

## 2016-12-11 NOTE — Telephone Encounter (Signed)
Patient called with questions about her last pap smear and "high risk HPV testing."

## 2016-12-11 NOTE — Telephone Encounter (Signed)
Left message to call Holley Wirt at [phone].  

## 2016-12-18 ENCOUNTER — Encounter: Payer: Self-pay | Admitting: Internal Medicine

## 2016-12-19 NOTE — Telephone Encounter (Signed)
Spoke with patient. Patient states she has questions about billing of pap and hpv results. Advised patient can forward message to billing department for review and return call. Patient is agreeable.  Routing to provider for final review. Patient is agreeable to disposition. Will close encounter.   Cc: Irish Elders

## 2016-12-29 ENCOUNTER — Ambulatory Visit: Payer: BC Managed Care – PPO | Admitting: Obstetrics & Gynecology

## 2017-01-01 ENCOUNTER — Ambulatory Visit (INDEPENDENT_AMBULATORY_CARE_PROVIDER_SITE_OTHER): Payer: BC Managed Care – PPO | Admitting: Nurse Practitioner

## 2017-01-01 ENCOUNTER — Encounter: Payer: Self-pay | Admitting: Nurse Practitioner

## 2017-01-01 VITALS — BP 96/64 | HR 65 | Temp 97.8°F | Ht 71.0 in | Wt 196.0 lb

## 2017-01-01 DIAGNOSIS — Z0000 Encounter for general adult medical examination without abnormal findings: Secondary | ICD-10-CM

## 2017-01-01 DIAGNOSIS — F4320 Adjustment disorder, unspecified: Secondary | ICD-10-CM | POA: Diagnosis not present

## 2017-01-01 DIAGNOSIS — Z136 Encounter for screening for cardiovascular disorders: Secondary | ICD-10-CM

## 2017-01-01 DIAGNOSIS — F4321 Adjustment disorder with depressed mood: Secondary | ICD-10-CM

## 2017-01-01 DIAGNOSIS — M549 Dorsalgia, unspecified: Secondary | ICD-10-CM | POA: Insufficient documentation

## 2017-01-01 DIAGNOSIS — M545 Low back pain, unspecified: Secondary | ICD-10-CM

## 2017-01-01 DIAGNOSIS — G8929 Other chronic pain: Secondary | ICD-10-CM | POA: Diagnosis not present

## 2017-01-01 HISTORY — DX: Adjustment disorder with depressed mood: F43.21

## 2017-01-01 NOTE — Assessment & Plan Note (Signed)
Secondary to Harrington Rod placed 1980s due to scoliosis.  Intermittent back spasms managed with NSAIDs and rest

## 2017-01-01 NOTE — Progress Notes (Signed)
Subjective:    Patient ID: Amber Green, female    DOB: [birth year], 51 y.o.   MRN: [id number]  Patient presents today for establish care (new patient)  HPI   no previous pcp.  Has GYN who performed recent CPE (10/2016) and labs done.  She is in office today to discuss cardiovascular screen due to husband's sudden death from MI. She has 2 teenage sons and she is concerned the same may happen to her and her children will be left without a parent.  Grief: Sudden death of husband. She found him in their home. Autopsy indicated MI as cause of death. Ongoing counseling. Not interested in use on medications at this time. She has returned to work after taking 4weeks off. Reports her sleep has improved but still has period of crying. She no longer has nightmares as she did initially.  Immunizations: (TDAP, Hep C screen, Pneumovax, Influenza, zoster)  Health Maintenance  Topic Date Due  . HIV Screening  05/29/1980  . Colon Cancer Screening  05/30/2015  . Flu Shot  04/18/2017  . Mammogram  11/28/2018  . Pap Smear  10/20/2019  . Tetanus Vaccine  09/06/2025   Diet:regular Weight:  Wt Readings from Last 3 Encounters:  01/01/17 196 lb (88.9 kg)  10/19/16 201 lb (91.2 kg)  09/07/15 197 lb (89.4 kg)   Exercise:none at this time Fall Risk: Fall Risk  01/01/2017  Falls in the past year? No   Home Safety:home with sons Depression/Suicide: Depression screen [facility] 2/9 01/01/2017  Decreased Interest 1  Down, Depressed, Hopeless 1  PHQ - 2 Score 2  Altered sleeping 1  Tired, decreased energy 1  Change in appetite 0  Feeling bad or failure about yourself  0  Trouble concentrating 0  Moving slowly or fidgety/restless 0  Suicidal thoughts 0  PHQ-9 Score 4   No flowsheet data found. Colonoscopy (every 5-9yrs, >50-67yrs): scheduled for 01/11/2017 Pap Smear (every 70yrs for >21-29 without HPV, every 18yrs for >30-58yrs with HPV):up to date Mammogram (yearly, >95yrs):up to  date Sexual History (birth control, marital status, STD):widow, teacher, has 2 teenage sons  Medications and allergies reviewed with patient and updated if appropriate.  Patient Active Problem List   Diagnosis Date Noted  . Back pain 01/01/2017  . Grief 01/01/2017    Current Outpatient Prescriptions on File Prior to Visit  Medication Sig Dispense Refill  . levonorgestrel-ethinyl estradiol (AVIANE,ALESSE,LESSINA) 0.1-20 MG-MCG tablet Take 1 tablet by mouth daily. (Patient not taking: Reported on 01/01/2017) 3 Package 4   No current facility-administered medications on file prior to visit.     Past Medical History:  Diagnosis Date  . Clostridium difficile colitis 2003  . Scoliosis     Past Surgical History:  Procedure Laterality Date  . APPENDECTOMY  1987  . 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 RSO  1987   right ovaria cyst removal    Social History   Social History  . Marital status: Single    Spouse name: N/A  . Number of children: N/A  . Years of education: N/A   Social History Main Topics  . Smoking status: Never Smoker  . Smokeless tobacco: Never Used  . Alcohol use 0.5 - 1.0 oz/week    1 - 2 Standard drinks or equivalent per week     Comment: social  . Drug use: No  . Sexual activity: No   Other Topics Concern  . None   Social History Narrative  . None  Family History  Problem Relation Age of Onset  . Diabetes Maternal Grandfather   . Hypertension Maternal Grandfather   . Stroke Maternal Grandfather   . Lung cancer Maternal Grandmother   . Breast cancer Mother 70    mastectomy  . Hypothyroidism Mother   . Skin cancer Brother     not melanoma  . Hypertension Father         Review of Systems  Constitutional: Negative for fever, malaise/fatigue and weight loss.  HENT: Negative for congestion and sore throat.   Eyes:       Negative for visual changes  Respiratory: Negative for cough and shortness of breath.   Cardiovascular: Negative for chest  pain, palpitations and leg swelling.  Gastrointestinal: Negative for blood in stool, constipation, diarrhea and heartburn.  Genitourinary: Negative for dysuria, frequency and urgency.  Musculoskeletal: Negative for falls, joint pain and myalgias.  Skin: Negative for rash.  Neurological: Negative for dizziness, sensory change and headaches.  Endo/Heme/Allergies: Does not bruise/bleed easily.  Psychiatric/Behavioral: Positive for depression. Negative for substance abuse and suicidal ideas. The patient has insomnia. The patient is not nervous/anxious.     Objective:   Vitals:   01/01/17 1114  BP: 96/64  Pulse: 65  Temp: 97.8 F (36.6 C)    Body mass index is 27.34 kg/m.   Physical Examination:  Physical Exam  Constitutional: She is oriented to person, place, and time and well-developed, well-nourished, and in no distress. No distress.  HENT:  Right Ear: External ear normal.  Left Ear: External ear normal.  Nose: Nose normal.  Mouth/Throat: Oropharynx is clear and moist. No oropharyngeal exudate.  Eyes: Conjunctivae and EOM are normal. Pupils are equal, round, and reactive to light. No scleral icterus.  Neck: Normal range of motion. Neck supple. No thyromegaly present.  Cardiovascular: Normal rate, regular rhythm, normal heart sounds and intact distal pulses.  Exam reveals no gallop.   No murmur heard. Pulmonary/Chest: Effort normal and breath sounds normal. She exhibits no tenderness.  Abdominal: Soft. Bowel sounds are normal. She exhibits no distension. There is no tenderness.  Musculoskeletal: Normal range of motion. She exhibits no edema or tenderness.  Lymphadenopathy:    She has no cervical adenopathy.  Neurological: She is alert and oriented to person, place, and time. Gait normal.  Skin: Skin is warm and dry.  Psychiatric: Affect and judgment normal.    ASSESSMENT and PLAN:  Amber Green was seen today for establish care.  Diagnoses and all orders for this  visit:  Encounter for medical examination to establish care  Encounter for screening for cardiovascular disorders -     EKG 12-Lead -     Exercise Tolerance Test; Future  Grief  Chronic bilateral low back pain without sciatica    Back pain Secondary to Harrington Rod placed 1980s due to scoliosis.  Intermittent back spasms managed with NSAIDs and rest  Grief Sudden death of husband December 13, 2016 (MI). Found him dead at home. Current grief counselling. No interested in use of medication at this time. She has returned to work and taking care of 2 teenage sons (43 and 39). No SI or HI. Some insomnia but improving. Till tearful when she talks about husband.     Follow up: Return in about 6 months (around 07/03/2017) for grief.  Wilfred Lacy, NP

## 2017-01-01 NOTE — Progress Notes (Signed)
Pre visit review using our clinic review tool, if applicable. No additional management support is needed unless otherwise documented below in the visit note. 

## 2017-01-01 NOTE — Patient Instructions (Signed)
Normal ECG.  You will be contacted to schedule treadmill stress test.    Nonspecific Chest Pain Chest pain can be caused by many different conditions. There is always a chance that your pain could be related to something serious, such as a heart attack or a blood clot in your lungs. Chest pain can also be caused by conditions that are not life-threatening. If you have chest pain, it is very important to follow up with your health care provider. What are the causes? Causes of this condition include:  Heartburn.  Pneumonia or bronchitis.  Anxiety or stress.  Inflammation around your heart (pericarditis) or lung (pleuritis or pleurisy).  A blood clot in your lung.  A collapsed lung (pneumothorax). This can develop suddenly on its own (spontaneous pneumothorax) or from trauma to the chest.  Shingles infection (varicella-zoster virus).  Heart attack.  Damage to the bones, muscles, and cartilage that make up your chest wall. This can include:  Bruised bones due to injury.  Strained muscles or cartilage due to frequent or repeated coughing or overwork.  Fracture to one or more ribs.  Sore cartilage due to inflammation (costochondritis). What increases the risk? Risk factors for this condition may include:  Activities that increase your risk for trauma or injury to your chest.  Respiratory infections or conditions that cause frequent coughing.  Medical conditions or overeating that can cause heartburn.  Heart disease or family history of heart disease.  Conditions or health behaviors that increase your risk of developing a blood clot.  Having had chicken pox (varicella zoster). What are the signs or symptoms? Chest pain can feel like:  Burning or tingling on the surface of your chest or deep in your chest.  Crushing, pressure, aching, or squeezing pain.  Dull or sharp pain that is worse when you move, cough, or take a deep breath.  Pain that is also felt in your  back, neck, shoulder, or arm, or pain that spreads to any of these areas. Your chest pain may come and go, or it may stay constant. How is this diagnosed? Lab tests or other studies may be needed to find the cause of your pain. Your health care provider may have you take a test called an ECG (electrocardiogram). An ECG records your heartbeat patterns at the time the test is performed. You may also have other tests, such as:  Transthoracic echocardiogram (TTE). In this test, sound waves are used to create a picture of the heart structures and to look at how blood flows through your heart.  Transesophageal echocardiogram (TEE).This is a more advanced imaging test that takes images from inside your body. It allows your health care provider to see your heart in finer detail.  Cardiac monitoring. This allows your health care provider to monitor your heart rate and rhythm in real time.  Holter monitor. This is a portable device that records your heartbeat and can help to diagnose abnormal heartbeats. It allows your health care provider to track your heart activity for several days, if needed.  Stress tests. These can be done through exercise or by taking medicine that makes your heart beat more quickly.  Blood tests.  Other imaging tests. How is this treated? Treatment depends on what is causing your chest pain. Treatment may include:  Medicines. These may include:  Acid blockers for heartburn.  Anti-inflammatory medicine.  Pain medicine for inflammatory conditions.  Antibiotic medicine, if an infection is present.  Medicines to dissolve blood clots.  Medicines to  treat coronary artery disease (CAD).  Supportive care for conditions that do not require medicines. This may include:  Resting.  Applying heat or cold packs to injured areas.  Limiting activities until pain decreases. Follow these instructions at home: Medicines   If you were prescribed an antibiotic, take it as told  by your health care provider. Do not stop taking the antibiotic even if you start to feel better.  Take over-the-counter and prescription medicines only as told by your health care provider. Lifestyle   Do not use any products that contain nicotine or tobacco, such as cigarettes and e-cigarettes. If you need help quitting, ask your health care provider.  Do not drink alcohol.  Make lifestyle changes as directed by your health care provider. These may include:  Getting regular exercise. Ask your health care provider to suggest some activities that are safe for you.  Eating a heart-healthy diet. A registered dietitian can help you to learn healthy eating options.  Maintaining a healthy weight.  Managing diabetes, if necessary.  Reducing stress, such as with yoga or relaxation techniques. General instructions   Avoid any activities that bring on chest pain.  If heartburn is the cause for your chest pain, raise (elevate) the head of your bed about 6 inches (15 cm) by putting blocks under the legs. Sleeping with more pillows does not effectively relieve heartburn because it only changes the position of your head.  Keep all follow-up visits as told by your health care provider. This is important. This includes any further testing if your chest pain does not go away. Contact a health care provider if:  Your chest pain does not go away.  You have a rash with blisters on your chest.  You have a fever.  You have chills. Get help right away if:  Your chest pain is worse.  You have a cough that gets worse, or you cough up blood.  You have severe pain in your abdomen.  You have severe weakness.  You faint.  You have sudden, unexplained chest discomfort.  You have sudden, unexplained discomfort in your arms, back, neck, or jaw.  You have shortness of breath at any time.  You suddenly start to sweat, or your skin gets clammy.  You feel nauseous or you vomit.  You suddenly  feel light-headed or dizzy.  Your heart begins to beat quickly, or it feels like it is skipping beats. These symptoms may represent a serious problem that is an emergency. Do not wait to see if the symptoms will go away. Get medical help right away. Call your local emergency services (911 in the [place] Do not drive yourself to the hospital. This information is not intended to replace advice given to you by your health care provider. Make sure you discuss any questions you have with your health care provider. Document Released: 06/14/2005 Document Revised: 05/29/2016 Document Reviewed: 05/29/2016 Elsevier Interactive Patient Education  2017 [employer]   Back Pain, Adult Back pain is very common. The pain often gets better over time. The cause of back pain is usually not dangerous. Most people can learn to manage their back pain on their own. Follow these instructions at home: Watch your back pain for any changes. The following actions may help to lessen any pain you are feeling:  Stay active. Start with short walks on flat ground if you can. Try to walk farther each day.  Exercise regularly as told by your doctor. Exercise helps your back heal faster.  It also helps avoid future injury by keeping your muscles strong and flexible.  Do not sit, drive, or stand in one place for more than 30 minutes.  Do not stay in bed. Resting more than 1-2 days can slow down your recovery.  Be careful when you bend or lift an object. Use good form when lifting:  Bend at your knees.  Keep the object close to your body.  Do not twist.  Sleep on a firm mattress. Lie on your side, and bend your knees. If you lie on your back, put a pillow under your knees.  Take medicines only as told by your doctor.  Put ice on the injured area.  Put ice in a plastic bag.  Place a towel between your skin and the bag.  Leave the ice on for 20 minutes, 2-3 times a day for the first 2-3 days. After that, you can  switch between ice and heat packs.  Avoid feeling anxious or stressed. Find good ways to deal with stress, such as exercise.  Maintain a healthy weight. Extra weight puts stress on your back. Contact a doctor if:  You have pain that does not go away with rest or medicine.  You have worsening pain that goes down into your legs or buttocks.  You have pain that does not get better in one week.  You have pain at night.  You lose weight.  You have a fever or chills. Get help right away if:  You cannot control when you poop (bowel movement) or pee (urinate).  Your arms or legs feel weak.  Your arms or legs lose feeling (numbness).  You feel sick to your stomach (nauseous) or throw up (vomit).  You have belly (abdominal) pain.  You feel like you may pass out (faint). This information is not intended to replace advice given to you by your health care provider. Make sure you discuss any questions you have with your health care provider. Document Released: 02/21/2008 Document Revised: 02/10/2016 Document Reviewed: 01/06/2014 Elsevier Interactive Patient Education  2017 [employer].

## 2017-01-01 NOTE — Assessment & Plan Note (Signed)
Sudden death of husband 10/2016 (MI). Found him dead at home. Current grief counselling. No interested in use of medication at this time. She has returned to work and taking care of 2 teenage sons (71 and 73). No SI or HI. Some insomnia but improving. Till tearful when she talks about husband.

## 2017-01-03 ENCOUNTER — Encounter: Payer: Self-pay | Admitting: Nurse Practitioner

## 2017-01-17 ENCOUNTER — Ambulatory Visit (INDEPENDENT_AMBULATORY_CARE_PROVIDER_SITE_OTHER): Payer: BC Managed Care – PPO

## 2017-01-17 DIAGNOSIS — Z136 Encounter for screening for cardiovascular disorders: Secondary | ICD-10-CM | POA: Diagnosis not present

## 2017-01-17 LAB — EXERCISE TOLERANCE TEST
CSEPED: 9 min
CSEPHR: 90 %
Estimated workload: 10.1 METS
Exercise duration (sec): 0 s
MPHR: 169 {beats}/min
Peak HR: 153 {beats}/min
RPE: 16
Rest HR: 80 {beats}/min

## 2017-01-18 ENCOUNTER — Telehealth: Payer: Self-pay | Admitting: Nurse Practitioner

## 2017-01-18 NOTE — Telephone Encounter (Signed)
Pt called stating that her legs has been hurting for a while now, especially when she lay down or sit too long. She was wondering if the stress test might have tell something about this pain. Please advise, pt aware stress test is normal.

## 2017-01-18 NOTE — Telephone Encounter (Signed)
No, stress test will not

## 2017-01-18 NOTE — Telephone Encounter (Signed)
Left patient vm to call back to schedule appt  °

## 2017-01-18 NOTE — Telephone Encounter (Signed)
Please inform pt that stress test will not tell for this problem and  offer an appt for more evaluation about this since pt have not see Baldo Ash for this problem yet.

## 2017-01-25 ENCOUNTER — Other Ambulatory Visit: Payer: Self-pay | Admitting: Nurse Practitioner

## 2017-01-25 ENCOUNTER — Encounter: Payer: Self-pay | Admitting: Nurse Practitioner

## 2017-01-25 ENCOUNTER — Ambulatory Visit (INDEPENDENT_AMBULATORY_CARE_PROVIDER_SITE_OTHER)
Admission: RE | Admit: 2017-01-25 | Discharge: 2017-01-25 | Disposition: A | Discharge status: Home / Self Care | Payer: BC Managed Care – PPO | Source: Ambulatory Visit | Attending: Nurse Practitioner | Admitting: Nurse Practitioner

## 2017-01-25 ENCOUNTER — Other Ambulatory Visit (INDEPENDENT_AMBULATORY_CARE_PROVIDER_SITE_OTHER): Payer: BC Managed Care – PPO

## 2017-01-25 ENCOUNTER — Ambulatory Visit (INDEPENDENT_AMBULATORY_CARE_PROVIDER_SITE_OTHER): Payer: BC Managed Care – PPO | Admitting: Nurse Practitioner

## 2017-01-25 VITALS — BP 110/74 | HR 61 | Temp 97.7°F | Ht 71.0 in | Wt 197.0 lb

## 2017-01-25 DIAGNOSIS — M25551 Pain in right hip: Secondary | ICD-10-CM

## 2017-01-25 DIAGNOSIS — M5136 Other intervertebral disc degeneration, lumbar region: Secondary | ICD-10-CM

## 2017-01-25 DIAGNOSIS — M545 Low back pain: Secondary | ICD-10-CM | POA: Diagnosis not present

## 2017-01-25 DIAGNOSIS — M25552 Pain in left hip: Secondary | ICD-10-CM

## 2017-01-25 DIAGNOSIS — M79604 Pain in right leg: Secondary | ICD-10-CM

## 2017-01-25 DIAGNOSIS — M7061 Trochanteric bursitis, right hip: Secondary | ICD-10-CM | POA: Diagnosis not present

## 2017-01-25 DIAGNOSIS — W57XXXS Bitten or stung by nonvenomous insect and other nonvenomous arthropods, sequela: Secondary | ICD-10-CM

## 2017-01-25 DIAGNOSIS — M4316 Spondylolisthesis, lumbar region: Secondary | ICD-10-CM

## 2017-01-25 DIAGNOSIS — M79605 Pain in left leg: Secondary | ICD-10-CM

## 2017-01-25 DIAGNOSIS — M7062 Trochanteric bursitis, left hip: Secondary | ICD-10-CM

## 2017-01-25 DIAGNOSIS — G8929 Other chronic pain: Secondary | ICD-10-CM | POA: Diagnosis not present

## 2017-01-25 DIAGNOSIS — Z8739 Personal history of other diseases of the musculoskeletal system and connective tissue: Secondary | ICD-10-CM | POA: Insufficient documentation

## 2017-01-25 LAB — URIC ACID: URIC ACID, SERUM: 3.7 mg/dL (ref 2.4–7.0)

## 2017-01-25 LAB — SEDIMENTATION RATE: SED RATE: 6 mm/h (ref 0–30)

## 2017-01-25 MED ORDER — GABAPENTIN 100 MG PO CAPS: 200.0000 mg | ORAL_CAPSULE | Freq: Every day | ORAL | 1 refills | Status: DC

## 2017-01-25 MED ORDER — MELOXICAM 7.5 MG PO TABS: 7.5000 mg | ORAL_TABLET | Freq: Every day | ORAL | 0 refills | Status: DC | PRN

## 2017-01-25 NOTE — Patient Instructions (Addendum)
Go to basement for x-ray and blood draw.  You will be called with results.  She declined left hip injection. She only wants right hip injection.  Complete physical therapy, use meloxicam prn. Refer to spine specialist if no improvement in 66months.  Hip Bursitis Hip bursitis is inflammation of a fluid-filled sac (bursa) in the hip joint. The bursa protects the bones in the hip joint from rubbing against each other. Hip bursitis can cause mild to moderate pain, and symptoms often come and go over time. What are the causes? This condition may be caused by:  Injury to the hip.  Overuse of the muscles that surround the hip joint.  Arthritis or gout.  Diabetes.  Thyroid disease.  Cold weather.  Infection. In some cases, the cause may not be known. What are the signs or symptoms? Symptoms of this condition may include:  Mild or moderate pain in the hip area. Pain may get worse with movement.  Tenderness and swelling of the hip, especially on the outer side of the hip. Symptoms may come and go. If the bursa becomes infected, you may have the following symptoms:  Fever.  Red skin and a feeling of warmth in the hip area. How is this diagnosed? This condition may be diagnosed based on:  A physical exam.  Your medical history.  X-rays.  Removal of fluid from your inflamed bursa for testing (biopsy). You may be sent to a health care provider who specializes in bone diseases (orthopedist) or a provider who specializes in joint inflammation (rheumatologist). How is this treated? This condition is treated by resting, raising (elevating), and applying pressure(compression) to the injured area. In some cases, this may be enough to make your symptoms go away. Treatment may also include:  Crutches.  Antibiotic medicine.  Draining fluid out of the bursa to help relieve swelling.  Injecting medicine that helps to reduce inflammation (cortisone). Follow these instructions at  home: Medicines   Take over-the-counter and prescription medicines only as told by your health care provider.  Do not drive or operate heavy machinery while taking prescription pain medicine, or as told by your health care provider.  If you were prescribed an antibiotic, take it as told by your health care provider. Do not stop taking the antibiotic even if you start to feel better. Activity   Return to your normal activities as told by your health care provider. Ask your health care provider what activities are safe for you.  Rest and protect your hip as much as possible until your pain and swelling get better. General instructions   Wear compression wraps only as told by your health care provider.  Elevate your hip above the level of your heart as much as you can without pain. To do this, try putting a pillow under your hips while you lie down.  Do not use your hip to support your body weight until your health care provider says that you can. Use crutches as told by your health care provider.  Gently massage and stretch your injured area as often as is comfortable.  Keep all follow-up visits as told by your health care provider. This is important. How is this prevented?  Exercise regularly, as told by your health care provider.  Warm up and stretch before being active.  Cool down and stretch after being active.  If an activity irritates your hip or causes pain, avoid the activity as much as possible.  Avoid sitting down for long periods at a  time. Contact a health care provider if:  You have a fever.  You develop new symptoms.  You have difficulty walking or doing everyday activities.  You have pain that gets worse or does not get better with medicine.  You develop red skin or a feeling of warmth in your hip area. Get help right away if:  You cannot move your hip.  You have severe pain. This information is not intended to replace advice given to you by your health  care provider. Make sure you discuss any questions you have with your health care provider. Document Released: 02/24/2002 Document Revised: 02/10/2016 Document Reviewed: 04/06/2015 Elsevier Interactive Patient Education  2017 [employer].

## 2017-01-25 NOTE — Assessment & Plan Note (Signed)
Per DG lumbar spine 01/25/17: possible adjacent segment disease?

## 2017-01-25 NOTE — Addendum Note (Signed)
Addended by: Wilfred Lacy L on: 01/25/2017 02:23 PM   Modules accepted: Orders

## 2017-01-25 NOTE — Progress Notes (Addendum)
Subjective:  Patient ID: Amber Green, female    DOB: [birth year]  Age: 51 y.o. MRN: [id number]  CC: Leg Pain (hips/legs pain when lay down going on for 1 mo/ when she get up and move--pain go away. )   Leg Pain   Incident onset: 42month. There was no injury mechanism. The pain is present in the left leg, left hip, left thigh, right leg, right hip and right thigh. The quality of the pain is described as cramping and aching. The pain has been fluctuating since onset. Associated symptoms include tingling. Pertinent negatives include no inability to bear weight, loss of motion, muscle weakness or numbness. Associated symptoms comments: Tingling in feet at bedtime.. Exacerbated by: laying supine. She has tried nothing for the symptoms.  no change in GI or GU function. Pain is worse with laying supine, or on her side or prolonged sitting Pain is relieved with walking and stretching exercise. Stiffness improves in <149ms.  Had insects bite 11/2016 which led to red rash on left upper arm. She was evaluated by another provider; prescribed oral abx, but did not take because she did not feel it was necessary.  Rash eventually resolved. She is now concerned about possible lyme dx since she did not see type of insect. Wondering if this ti cause of her symptoms.  Outpatient Medications Prior to Visit  Medication Sig Dispense Refill  . levonorgestrel-ethinyl estradiol (AVIANE,ALESSE,LESSINA) 0.1-20 MG-MCG tablet Take 1 tablet by mouth daily. (Patient not taking: Reported on 01/01/2017) 3 Package 4   No facility-administered medications prior to visit.     ROS See HPI  Objective:  BP 110/74   Pulse 61   Temp 97.7 F (36.5 C)   Ht _0  (1.803 m)   Wt 197 lb (89.4 kg)   SpO2 99%   BMI 27.48 kg/m   BP Readings from Last 3 Encounters:  01/25/17 110/74  01/01/17 96/64  10/19/16 110/76    Wt Readings from Last 3 Encounters:  01/25/17 197 lb (89.4 kg)  01/01/17 196 lb (88.9 kg)    10/19/16 201 lb (91.2 kg)    Physical Exam  Constitutional: She is oriented to person, place, and time. No distress.  Cardiovascular: Normal rate, regular rhythm and normal heart sounds.   Pulmonary/Chest: Effort normal.  Abdominal: Soft. Bowel sounds are normal.  Musculoskeletal: She exhibits tenderness. She exhibits no edema or deformity.       Right hip: She exhibits tenderness and bony tenderness. She exhibits normal range of motion, normal strength and no crepitus.       Left hip: She exhibits tenderness and bony tenderness. She exhibits normal range of motion and normal strength.       Right knee: Normal.       Left knee: Normal.       Right ankle: Normal.       Left ankle: Normal.       Lumbar back: Normal.       Right upper leg: Normal.       Left upper leg: Normal.       Right lower leg: Normal.       Left lower leg: Normal.       Right foot: Normal.       Left foot: Normal.  Tenderness over bilateral trochanter bursa, worse on right side. Pain with internal hip rotation. Negative straight leg raise. Normal gait. No muscle atrophy noted. Normal and equal distal pulses  Neurological: She is alert and oriented  to person, place, and time. No cranial nerve deficit.  Vitals reviewed.   Lab Results  Component Value Date   WBC 7.0 10/19/2016   HGB 13.8 10/19/2016   HCT 42.6 10/19/2016   PLT 253 10/19/2016   GLUCOSE 75 10/19/2016   CHOL 170 10/19/2016   TRIG 72 10/19/2016   HDL 84 10/19/2016   LDLCALC 72 10/19/2016   ALT 15 10/19/2016   AST 15 10/19/2016   NA 142 10/19/2016   K 4.2 10/19/2016   CL 105 10/19/2016   CREATININE 1.08 (H) 10/19/2016   BUN 14 10/19/2016   CO2 24 10/19/2016   TSH 0.82 10/19/2016    Mm Screening Breast Tomo Bilateral  Result Date: 11/27/2016 CLINICAL DATA:  Screening. EXAM: 2D DIGITAL SCREENING BILATERAL MAMMOGRAM WITH CAD AND ADJUNCT TOMO COMPARISON:  Previous exam(s). ACR Breast Density Category b: There are scattered areas of  fibroglandular density. FINDINGS: There are no findings suspicious for malignancy. Images were processed with CAD. IMPRESSION: No mammographic evidence of malignancy. A result letter of this screening mammogram will be mailed directly to the patient. RECOMMENDATION: Screening mammogram in one year. (Code:SM-B-01Y) BI-RADS CATEGORY  1: Negative. Electronically Signed   By: Abelardo Diesel M.D.   On: 11/27/2016 13:33    Procedure Note :     Procedure : Joint Injection: right trochanter bursa   Indication:  Joint osteoarthritis with refractory pain.   Risks including unsuccessful procedure , bleeding, infection, bruising, skin atrophy, "steroid flare-up" and others were explained to the patient in detail as well as the benefits. Verbal consent was obtained.  The patient was placed in a comfortable position. Lateral approach was used. Skin was prepped with Betadine and alcohol  and anesthetized a cooling spray. Then, a 5cc syringe with a 1.5 inch long 23-gauge needle was used for a joint injection. The needle was advanced  Into the right lateral hip. I pulled back to confirm correct placement of the needle and injected the joint with 69m of 2% lidocaine and 214mof Depo-Medrol .  Band-Aid was applied.   Tolerated well. Complications: None. Good pain relief following the procedure.   Postprocedure instructions :    A Band-Aid should be left on for 12 hours. Injection therapy is not a cure itself. It is used in conjunction with other modalities. You can use nonsteroidal anti-inflammatories like ibuprofen , hot and cold compresses. Rest is recommended in the next 24 hours. You need to report immediately  if fever, chills or any signs of infection develop.  Assessment & Plan:   JeYesikaas seen today for leg pain.  Diagnoses and all orders for this visit:  Trochanteric bursitis of both hips -     Sed Rate (ESR); Future -     Rheumatoid Factor; Future -     Cancel: DG HIP UNILAT WITH PELVIS 1V LEFT;  Future -     DG HIP UNILAT WITH PELVIS 1V RIGHT; Future -     meloxicam (MOBIC) 7.5 MG tablet; Take 1 tablet (7.5 mg total) by mouth daily as needed for pain. With food -     Uric acid; Future -     DG HIPS BILAT W OR W/O PELVIS 2V; Future -     gabapentin (NEURONTIN) 100 MG capsule; Take 2 capsules (200 mg total) by mouth at bedtime.  Pain in both lower extremities -     Sed Rate (ESR); Future -     Rheumatoid Factor; Future -     meloxicam (  MOBIC) 7.5 MG tablet; Take 1 tablet (7.5 mg total) by mouth daily as needed for pain. With food -     DG HIPS BILAT W OR W/O PELVIS 2V; Future -     gabapentin (NEURONTIN) 100 MG capsule; Take 2 capsules (200 mg total) by mouth at bedtime. -     Ambulatory referral to Physical Therapy  DDD (degenerative disc disease), lumbar -     DG Lumbar Spine Complete; Future -     meloxicam (MOBIC) 7.5 MG tablet; Take 1 tablet (7.5 mg total) by mouth daily as needed for pain. With food -     DG HIPS BILAT W OR W/O PELVIS 2V; Future -     gabapentin (NEURONTIN) 100 MG capsule; Take 2 capsules (200 mg total) by mouth at bedtime. -     Ambulatory referral to Physical Therapy  Insect bite, sequela -     Lyme Ab/Western Blot Reflex; Future  Spondylolisthesis of lumbar region -     Ambulatory referral to Physical Therapy   I have discontinued Ms. Trueheart's levonorgestrel-ethinyl estradiol. I am also having her start on meloxicam and gabapentin.  Meds ordered this encounter  Medications  . meloxicam (MOBIC) 7.5 MG tablet    Sig: Take 1 tablet (7.5 mg total) by mouth daily as needed for pain. With food    Dispense:  30 tablet    Refill:  0    Order Specific Question:   Supervising Provider    Answer:   Cassandria Anger [[id number]]  . gabapentin (NEURONTIN) 100 MG capsule    Sig: Take 2 capsules (200 mg total) by mouth at bedtime.    Dispense:  60 capsule    Refill:  1    Order Specific Question:   Supervising Provider    Answer:   Cassandria Anger  [[id number]]    Follow-up: Return if symptoms worsen or fail to improve.  Wilfred Lacy, NP

## 2017-01-25 NOTE — Assessment & Plan Note (Signed)
Possible adjacent segment disease? Consider referral to spine specialist if no improvement with gabapentin, meloxicam and PT.

## 2017-01-26 ENCOUNTER — Ambulatory Visit: Payer: BC Managed Care – PPO | Admitting: Nurse Practitioner

## 2017-01-26 DIAGNOSIS — M7061 Trochanteric bursitis, right hip: Secondary | ICD-10-CM | POA: Diagnosis not present

## 2017-01-26 DIAGNOSIS — M7062 Trochanteric bursitis, left hip: Secondary | ICD-10-CM | POA: Diagnosis not present

## 2017-01-26 LAB — LYME AB/WESTERN BLOT REFLEX

## 2017-01-26 LAB — RHEUMATOID FACTOR: Rhuematoid fact SerPl-aCnc: <14 IU/mL (ref <14)

## 2017-01-26 MED ORDER — METHYLPREDNISOLONE ACETATE 40 MG/ML IJ SUSP
20.0000 mg | Freq: Once | INTRAMUSCULAR | Status: AC
  Administered 2017-01-26: 20 mg via INTRA_ARTICULAR

## 2017-01-26 NOTE — Addendum Note (Signed)
Addended by: Wilfred Lacy L on: 01/26/2017 10:51 AM   Modules accepted: Orders

## 2017-02-27 ENCOUNTER — Ambulatory Visit: Payer: BC Managed Care – PPO | Attending: Nurse Practitioner | Admitting: Physical Therapy

## 2017-02-27 ENCOUNTER — Encounter: Payer: Self-pay | Admitting: Physical Therapy

## 2017-02-27 DIAGNOSIS — M6281 Muscle weakness (generalized): Secondary | ICD-10-CM

## 2017-02-27 DIAGNOSIS — M25552 Pain in left hip: Secondary | ICD-10-CM | POA: Diagnosis present

## 2017-02-27 DIAGNOSIS — M25551 Pain in right hip: Secondary | ICD-10-CM | POA: Diagnosis present

## 2017-02-27 DIAGNOSIS — M545 Low back pain: Secondary | ICD-10-CM | POA: Insufficient documentation

## 2017-02-27 DIAGNOSIS — G8929 Other chronic pain: Secondary | ICD-10-CM | POA: Diagnosis present

## 2017-02-27 NOTE — Patient Instructions (Signed)
  Roller 2/day 5 min

## 2017-02-27 NOTE — Therapy (Signed)
[place], [place], [address] Phone: [phone]   Fax:  [phone]  Physical Therapy Evaluation  Patient Details  Name: Amber Green MRN: [id number] Date of Birth: 1966/03/30 Referring Provider: Flossie Buffy, NP  Encounter Date: 02/27/2017      PT End of Session - 02/27/17 1506    Visit Number 1   Number of Visits 13   Date for PT Re-Evaluation 03/30/17   Authorization Type [employer] no visit limit   PT Start Time 6546   PT Stop Time 1553   PT Time Calculation (min) 47 min   Activity Tolerance Patient tolerated treatment well   Behavior During Therapy [facility] for tasks assessed/performed      Past Medical History:  Diagnosis Date  . Clostridium difficile colitis 2003  . Scoliosis     Past Surgical History:  Procedure Laterality Date  . ABDOMINAL HYSTERECTOMY  1987  . APPENDECTOMY  1987  . 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 RSO  1987   right ovaria cyst removal    There were no vitals filed for this visit.       Subjective Assessment - 02/27/17 1510    Subjective Had a bad back her whole lift. Harrington rod at 51 yo. After birth of first son, was moving car seat in/out of two door car. One day was getting out of car and knees gave out. Rehab helped after this incident. Now every year or two and small things such as sneezing throw out back. Usually better in about a week. Has not blown out back in about 1 year. Back in April, had pain in bilateral hips and thighs when laying down at night, would feel better when she was up and moving. Pain wakers her up at night, falls asleep on side and wakes on back. Was given a shot in her R hip which has helped significantly and she can sleep.    Patient Stated Goals sit to stand, learn to manage, avoid THA   Currently in Pain? Yes   Pain Score 5    Pain Location Hip   Pain Orientation Right   Pain Descriptors / Indicators Shooting   Pain Radiating  Towards R lateral thigh   Pain Onset More than a month ago   Pain Frequency Intermittent   Aggravating Factors  sit to stand after being still, laying   Pain Relieving Factors get up and moving            [facility] PT Assessment - 02/27/17 0001      Assessment   Medical Diagnosis LBP, bilateral hip pain   Referring Provider Nche, Charlene Brooke, NP   Onset Date/Surgical Date --  chronic   Hand Dominance Left   Next MD Visit as needed   Prior Therapy no  back in 2002 for back     Precautions   Precaution Comments Harrington rod to L2     Restrictions   Weight Bearing Restrictions No     Balance Screen   Has the patient fallen in the past 6 months No     [place] residence   Living Arrangements Children   Additional Comments steps to enter home 3-4     Prior Function   Level of Independence Independent   Music therapist, Geophysicist/field seismologist; works for [employer]   Overall Cognitive Status Within Functional Limits for tasks assessed  Observation/Other Assessments   Focus on Therapeutic Outcomes (FOTO)  40% limitation (goal 28%)     Sensation   Additional Comments tingling in bilateral feet before injections when laying down     ROM / Strength   AROM / PROM / Strength Strength;PROM     PROM   Overall PROM Comments limited hip IR/ER on R v L     Strength   Strength Assessment Site Hip   Right/Left Hip Right;Left   Right Hip Flexion 4/5   Right Hip Extension 4/5   Right Hip ABduction 4-/5   Left Hip Flexion 4-/5   Left Hip Extension 4+/5   Left Hip ABduction 4/5     Palpation   Palpation comment TTP R QL and glut max            Objective measurements completed on examination: See above findings.          [facility] Adult PT Treatment/Exercise - 02/27/17 0001      Exercises   Exercises Knee/Hip     Knee/Hip Exercises: Stretches   Other Knee/Hip Stretches figure 4      Knee/Hip Exercises: Sidelying   Clams x10     Manual Therapy   Manual Therapy Soft tissue mobilization   Soft tissue mobilization roller R ITB                PT Education - 02/27/17 1718    Education provided Yes   Education Details anatomy of condition, POC, HEP, exercise form/rationale   Person(s) Educated Patient   Methods Explanation;Demonstration;Tactile cues;Verbal cues;Handout   Comprehension Verbalized understanding;Returned demonstration;Verbal cues required;Tactile cues required;Need further instruction          PT Short Term Goals - 02/27/17 1729      PT SHORT TERM GOAL #1   Title Pt will demo gross hip strength to increase to at least 4+/5 by 7/13   Baseline see flowsheet   Time 4   Period Weeks   Status New     PT SHORT TERM GOAL #2   Title Pt will be able to perform R single knee to chest without pinching   Baseline pinching on anterior aspect of hip at eval   Time 4   Period Weeks   Status New           PT Long Term Goals - 02/27/17 1731      PT LONG TERM GOAL #1   Title FOTO to % limiation to indicate significant improvement in functional ability by 7/27   Baseline % limitation at eval   Time 6   Period Weeks   Status New     PT LONG TERM GOAL #2   Title Pt will demo proper form with use of gym equipment in order to transition to independent workout program    Baseline will educate as appropriate through POC   Time 6   Period Weeks   Status New     PT LONG TERM GOAL #3   Title Pt will be able to move from sit to stand without limitaiton by sensation of "stiffness"   Baseline feels stiff at eval   Time 6   Period Weeks   Status New     PT LONG TERM GOAL #4   Title Pt will be able to complete all household chores and self care activities without limitation by back and hip pain   Baseline limitations are intermittent but severe when they occur   Time 6  Period Weeks   Status New                Plan - 02/27/17 1719     Clinical Impression Statement Pt presents to PT with complaints of R low back and hip pain that has been on and off chronically. Recently had injection in R hip that is very helpful but now wants to know how to manage symptoms to keep them from returning. Pt with significant tightness in R QL and ITB leading to excessive compression at bursa. Pt was educated in proper sleeping posture and HEP to begin improving flexibility. Pt with weakness noted around hip and will benefit from strengthening and stretching to decrease pain.    History and Personal Factors relevant to plan of care: harrington rod   Clinical Presentation Evolving   Clinical Presentation due to: recently worsening   Clinical Decision Making Low   Rehab Potential Good   PT Frequency 2x / week   PT Duration 6 weeks   PT Treatment/Interventions ADLs/Self Care Home Management;Cryotherapy;Electrical Stimulation;Iontophoresis 4mg /ml Dexamethasone;Functional mobility training;Gait training;Ultrasound;Moist Heat;Therapeutic activities;Therapeutic exercise;Balance training;Neuromuscular re-education;Patient/family education;Passive range of motion;Manual techniques;Dry needling;Taping   PT Next Visit Plan core, glut strengthening   PT Home Exercise Plan figure 4 stretch, clam, roller R ITB;    Consulted and Agree with Plan of Care Patient      Patient will benefit from skilled therapeutic intervention in order to improve the following deficits and impairments:  Increased muscle spasms, Decreased activity tolerance, Pain, Improper body mechanics, Impaired flexibility, Decreased strength, Postural dysfunction, Decreased range of motion  Visit Diagnosis: Chronic bilateral low back pain, with sciatica presence unspecified - Plan: PT plan of care cert/re-cert  Pain in right hip - Plan: PT plan of care cert/re-cert  Pain in left hip - Plan: PT plan of care cert/re-cert  Muscle weakness (generalized) - Plan: PT plan of care  cert/re-cert     Problem List Patient Active Problem List   Diagnosis Date Noted  . H/O scoliosis 01/25/2017  . Back pain 01/01/2017  . Grief 01/01/2017    Josclyn Rosales C. Yezenia Fredrick PT, DPT 02/27/17 5:37 PM   [facility] [address] [place], [place], [address] Phone: [phone]   Fax:  [phone]  Name: Amber Green MRN: [id number] Date of Birth: 1966-06-12

## 2017-03-06 ENCOUNTER — Ambulatory Visit: Payer: BC Managed Care – PPO | Admitting: Physical Therapy

## 2017-03-06 DIAGNOSIS — M545 Low back pain: Secondary | ICD-10-CM | POA: Diagnosis not present

## 2017-03-06 DIAGNOSIS — M25551 Pain in right hip: Secondary | ICD-10-CM

## 2017-03-06 DIAGNOSIS — M6281 Muscle weakness (generalized): Secondary | ICD-10-CM

## 2017-03-06 DIAGNOSIS — M25552 Pain in left hip: Secondary | ICD-10-CM

## 2017-03-06 DIAGNOSIS — G8929 Other chronic pain: Secondary | ICD-10-CM

## 2017-03-06 NOTE — Therapy (Signed)
[place], [place], [address] Phone: [phone]   Fax:  [phone]  Physical Therapy Treatment  Patient Details  Name: Amber Green MRN: [id number] Date of Birth: 13-Feb-1966 Referring Provider: Flossie Buffy, NP  Encounter Date: 03/06/2017      PT End of Session - 03/06/17 1732    Visit Number 2   Number of Visits 13   Date for PT Re-Evaluation 03/30/17   PT Start Time 1634   PT Stop Time 7035   PT Time Calculation (min) 41 min   Activity Tolerance Patient tolerated treatment well   Behavior During Therapy [facility] for tasks assessed/performed      Past Medical History:  Diagnosis Date  . Clostridium difficile colitis 2003  . Scoliosis     Past Surgical History:  Procedure Laterality Date  . ABDOMINAL HYSTERECTOMY  1987  . APPENDECTOMY  1987  . 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 RSO  1987   right ovaria cyst removal    There were no vitals filed for this visit.      Subjective Assessment - 03/06/17 1717    Subjective Has been doing the exercises, has been doing the soft tissue work.  Clams are painful but i do them anyway.   Currently in Pain? Yes   Pain Score --  min to moderate   Pain Location Hip   Pain Orientation Right   Pain Descriptors / Indicators Tender;Shooting   Pain Radiating Towards R lateral thigh   Pain Frequency Intermittent   Aggravating Factors  sit to stand after sitting, laying   Pain Relieving Factors etting up and moving                         [facility] Adult PT Treatment/Exercise - 03/06/17 0001      Self-Care   Self-Care --  Scoot hips to side for sit to stand,  QL info anatomy      Lumbar Exercises: Supine   Clam 10 reps   Clam Limitations red band issued for HEP to do until clams on side are not painful.   Bridge 10 reps   Bridge Limitations some right hip pain with lowering   Large Ball Abdominal Isometric 10 reps  using folded  pillow   Large Ball Oblique Isometric 10 reps  each     Knee/Hip Exercises: Clinical research associate 3 reps;20 seconds     Knee/Hip Exercises: Standing   Hip Extension 5 reps   Extension Limitations hip extension leaning over tall mat 1 set each with straight and flexed knee     Knee/Hip Exercises: Sidelying   Clams 5 X  painful so stopped     Manual Therapy   Manual Therapy Soft tissue mobilization   Soft tissue mobilization roller R ITB,  1 X trigger point release Right Quadratus Lumborum holding about 30 seconds and not tolerated,  did not help,  so 3 x 30 second stretches sidelying over a pillow perssed hips and ribs apart  for stretch.    IT band, lateral hip roller painful,so IASTM tolerated                 PT Education - 03/06/17 1731    Education provided Yes   Education Details Anatomy,  HEP   Person(s) Educated Patient   Methods Explanation;Demonstration;Verbal cues;Handout   Comprehension Verbalized understanding;Returned demonstration  PT Short Term Goals - 02/27/17 1729      PT SHORT TERM GOAL #1   Title Pt will demo gross hip strength to increase to at least 4+/5 by 7/13   Baseline see flowsheet   Time 4   Period Weeks   Status New     PT SHORT TERM GOAL #2   Title Pt will be able to perform R single knee to chest without pinching   Baseline pinching on anterior aspect of hip at eval   Time 4   Period Weeks   Status New           PT Long Term Goals - 02/27/17 1731      PT LONG TERM GOAL #1   Title FOTO to % limiation to indicate significant improvement in functional ability by 7/27   Baseline % limitation at eval   Time 6   Period Weeks   Status New     PT LONG TERM GOAL #2   Title Pt will demo proper form with use of gym equipment in order to transition to independent workout program    Baseline will educate as appropriate through POC   Time 6   Period Weeks   Status New     PT LONG TERM GOAL #3   Title Pt will be  able to move from sit to stand without limitaiton by sensation of "stiffness"   Baseline feels stiff at eval   Time 6   Period Weeks   Status New     PT LONG TERM GOAL #4   Title Pt will be able to complete all household chores and self care activities without limitation by back and hip pain   Baseline limitations are intermittent but severe when they occur   Time 6   Period Weeks   Status New               Plan - 03/06/17 1732    Clinical Impression Statement Patient motivated to reduce pain.  She did not tolerate trigger point release to QL.  HEP modified to reduce pain.  Less pain at end of session noted by patient.     PT Treatment/Interventions ADLs/Self Care Home Management;Cryotherapy;Electrical Stimulation;Iontophoresis 4mg /ml Dexamethasone;Functional mobility training;Gait training;Ultrasound;Moist Heat;Therapeutic activities;Therapeutic exercise;Balance training;Neuromuscular re-education;Patient/family education;Passive range of motion;Manual techniques;Dry needling;Taping   PT Next Visit Plan core, glut strengthening.  Quadriped?     PT Home Exercise Plan figure 4 stretch, clam, roller R ITB;  Supine clam,  QL stretch on side   Consulted and Agree with Plan of Care Patient      Patient will benefit from skilled therapeutic intervention in order to improve the following deficits and impairments:  Increased muscle spasms, Decreased activity tolerance, Pain, Improper body mechanics, Impaired flexibility, Decreased strength, Postural dysfunction, Decreased range of motion  Visit Diagnosis: Chronic bilateral low back pain, with sciatica presence unspecified  Pain in right hip  Pain in left hip  Muscle weakness (generalized)     Problem List Patient Active Problem List   Diagnosis Date Noted  . H/O scoliosis 01/25/2017  . Back pain 01/01/2017  . Grief 01/01/2017    Kennethia Lynes PTA 03/06/2017, 5:37 PM  [facility] [address] [place], [place], [address] Phone: [phone]   Fax:  [phone]  Name: ORENE ABBASI MRN: [id number] Date of Birth: Dec 22, 1965

## 2017-03-06 NOTE — Patient Instructions (Addendum)
Quadratus lumborum info and sidelying stretch issued form exercise drawer,  1 rep 30 seconds. On side over pillow. Daily to reduce pain    OK to do clams in hook lying with red band  Until able to do on side.

## 2017-03-08 ENCOUNTER — Ambulatory Visit: Payer: BC Managed Care – PPO | Admitting: Physical Therapy

## 2017-03-08 ENCOUNTER — Encounter: Payer: Self-pay | Admitting: Physical Therapy

## 2017-03-08 DIAGNOSIS — M6281 Muscle weakness (generalized): Secondary | ICD-10-CM

## 2017-03-08 DIAGNOSIS — M545 Low back pain: Secondary | ICD-10-CM | POA: Diagnosis not present

## 2017-03-08 DIAGNOSIS — M25552 Pain in left hip: Secondary | ICD-10-CM

## 2017-03-08 DIAGNOSIS — G8929 Other chronic pain: Secondary | ICD-10-CM

## 2017-03-08 DIAGNOSIS — M25551 Pain in right hip: Secondary | ICD-10-CM

## 2017-03-08 NOTE — Therapy (Signed)
[address], [place], [address] Phone: [phone]   Fax:  [phone]  Physical Therapy Treatment  Patient Details  Name: Amber Green MRN: [id number] Date of Birth: 29-Aug-1966 Referring Provider: Flossie Buffy, NP  Encounter Date: 03/08/2017      PT End of Session - 03/08/17 1346    Visit Number 3   Number of Visits 13   Date for PT Re-Evaluation 03/30/17   PT Start Time 1104   PT Stop Time 1147   PT Time Calculation (min) 43 min   Activity Tolerance Patient limited by fatigue   Behavior During Therapy [facility] for tasks assessed/performed      Past Medical History:  Diagnosis Date  . Clostridium difficile colitis 2003  . Scoliosis     Past Surgical History:  Procedure Laterality Date  . ABDOMINAL HYSTERECTOMY  1987  . APPENDECTOMY  1987  . 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 RSO  1987   right ovaria cyst removal    There were no vitals filed for this visit.      Subjective Assessment - 03/08/17 1108    Subjective No pain now. Continues with pain with sit to stand. did not remember to scoot to the side,   Currently in Pain? No/denies                         [facility] Adult PT Treatment/Exercise - 03/08/17 0001      Lumbar Exercises: Supine   Clam 10 reps   Clam Limitations red band, and 5 X each single knee no band   Bent Knee Raise 10 reps   Bent Knee Raise Limitations painful flexion end range helped posterion joint mobs hip   Bridge 10 reps   Bridge Limitations noted right low back , mild pain   Isometric Hip Flexion Limitations 10 X 5 seconds into ball  each side     Lumbar Exercises: Quadruped   Straight Leg Raises Limitations flexed and straight knee   Other Quadruped Lumbar Exercises bent hip/knee abduction 10 x each ,  difficult.     Knee/Hip Exercises: Stretches   Passive Hamstring Stretch 3 reps;30 seconds  RIGHT LE by PTA   Other Knee/Hip Stretches  IR/ER3 X 30 holds right hip     Manual Therapy   Manual Therapy Soft tissue mobilization  instrument assist lateral hip right.   Manual therapy comments grade II, III posterior joint mobsof hop 2 sets of 15-20 bouts to decrease hip pain                   PT Short Term Goals - 02/27/17 1729      PT SHORT TERM GOAL #1   Title Pt will demo gross hip strength to increase to at least 4+/5 by 7/13   Baseline see flowsheet   Time 4   Period Weeks   Status New     PT SHORT TERM GOAL #2   Title Pt will be able to perform R single knee to chest without pinching   Baseline pinching on anterior aspect of hip at eval   Time 4   Period Weeks   Status New           PT Long Term Goals - 02/27/17 1731      PT LONG TERM GOAL #1   Title FOTO to % limiation to indicate significant improvement in functional ability by 7/27  Baseline % limitation at eval   Time 6   Period Weeks   Status New     PT LONG TERM GOAL #2   Title Pt will demo proper form with use of gym equipment in order to transition to independent workout program    Baseline will educate as appropriate through POC   Time 6   Period Weeks   Status New     PT LONG TERM GOAL #3   Title Pt will be able to move from sit to stand without limitaiton by sensation of "stiffness"   Baseline feels stiff at eval   Time 6   Period Weeks   Status New     PT LONG TERM GOAL #4   Title Pt will be able to complete all household chores and self care activities without limitation by back and hip pain   Baseline limitations are intermittent but severe when they occur   Time 6   Period Weeks   Status New               Plan - 03/08/17 1346    Clinical Impression Statement Intermittant pain anterior right hip with hip flexion,  this improved some after posterior hip mobs.  Manual continues to be helpful.    PT Treatment/Interventions ADLs/Self Care Home Management;Cryotherapy;Electrical Stimulation;Iontophoresis  4mg /ml Dexamethasone;Functional mobility training;Gait training;Ultrasound;Moist Heat;Therapeutic activities;Therapeutic exercise;Balance training;Neuromuscular re-education;Patient/family education;Passive range of motion;Manual techniques;Dry needling;Taping   PT Next Visit Plan core, glut strengthening.  Quadriped.     PT Home Exercise Plan figure 4 stretch, clam, roller R ITB;  Supine clam,  QL stretch on side   Consulted and Agree with Plan of Care Patient      Patient will benefit from skilled therapeutic intervention in order to improve the following deficits and impairments:  Increased muscle spasms, Decreased activity tolerance, Pain, Improper body mechanics, Impaired flexibility, Decreased strength, Postural dysfunction, Decreased range of motion  Visit Diagnosis: Chronic bilateral low back pain, with sciatica presence unspecified  Pain in right hip  Pain in left hip  Muscle weakness (generalized)     Problem List Patient Active Problem List   Diagnosis Date Noted  . H/O scoliosis 01/25/2017  . Back pain 01/01/2017  . Grief 01/01/2017    Sarrinah Gardin PTA 03/08/2017, 1:52 PM  [facility] [address] [place], [place], [address] Phone: [phone]   Fax:  [phone]  Name: Amber Green MRN: [id number] Date of Birth: [birth year]

## 2017-03-09 ENCOUNTER — Encounter: Payer: BC Managed Care – PPO | Admitting: Internal Medicine

## 2017-03-12 ENCOUNTER — Encounter: Payer: Self-pay | Admitting: Physical Therapy

## 2017-03-12 ENCOUNTER — Ambulatory Visit: Payer: BC Managed Care – PPO | Admitting: Physical Therapy

## 2017-03-12 DIAGNOSIS — M6281 Muscle weakness (generalized): Secondary | ICD-10-CM

## 2017-03-12 DIAGNOSIS — M25551 Pain in right hip: Secondary | ICD-10-CM

## 2017-03-12 DIAGNOSIS — M545 Low back pain: Secondary | ICD-10-CM | POA: Diagnosis not present

## 2017-03-12 DIAGNOSIS — M25552 Pain in left hip: Secondary | ICD-10-CM

## 2017-03-12 DIAGNOSIS — G8929 Other chronic pain: Secondary | ICD-10-CM

## 2017-03-12 NOTE — Therapy (Signed)
[place], [place], [address] Phone: [phone]   Fax:  [phone]  Physical Therapy Treatment  Patient Details  Name: Amber Green MRN: [id number] Date of Birth: 1966/02/05 Referring Provider: Flossie Buffy, NP  Encounter Date: 03/12/2017      PT End of Session - 03/12/17 1148    Visit Number 4   Number of Visits 13   Date for PT Re-Evaluation 03/30/17   Authorization Type BCBS no visit limit   PT Start Time 1148   PT Stop Time 1231   PT Time Calculation (min) 43 min   Activity Tolerance Patient tolerated treatment well   Behavior During Therapy [facility] for tasks assessed/performed      Past Medical History:  Diagnosis Date  . Clostridium difficile colitis 2003  . Scoliosis     Past Surgical History:  Procedure Laterality Date  . ABDOMINAL HYSTERECTOMY  1987  . APPENDECTOMY  1987  . 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 RSO  1987   right ovaria cyst removal    There were no vitals filed for this visit.      Subjective Assessment - 03/12/17 1148    Subjective Hips are fine other than when she is trying to go to sleep. Pain laterally when laying on either side. R side tends to be worse. Did the exercises over the weekend and sit to stand seems to be getting better. Has heard occasional clicking in hip with flexion.    Patient Stated Goals sit to stand, learn to manage, avoid THA   Currently in Pain? No/denies                         [facility] Adult PT Treatment/Exercise - 03/12/17 0001      Knee/Hip Exercises: Stretches   Passive Hamstring Stretch Both;2 reps;30 seconds     Knee/Hip Exercises: Aerobic   Nustep 5 min L5     Knee/Hip Exercises: Seated   Clamshell with TheraBand Yellow  seated regular & rev     Knee/Hip Exercises: Supine   Bridges Limitations x10 each single leg with ball squeeze   Bridges with [employer] 15 reps   Other Supine Knee/Hip Exercises  pelvic tilt with ball squeeze   Other Supine Knee/Hip Exercises supine alt iso clam     Knee/Hip Exercises: Sidelying   Hip ABduction 15 reps;Both   Hip ABduction Limitations stopped due to lack of abductor activation   Clams painful on hip she was laying on                PT Education - 03/12/17 1155    Education provided Yes   Education Details exercise form/rationale, HEP   Person(s) Educated Patient   Methods Explanation;Demonstration;Tactile cues;Verbal cues   Comprehension Verbalized understanding;Returned demonstration;Verbal cues required;Tactile cues required;Need further instruction          PT Short Term Goals - 02/27/17 1729      PT SHORT TERM GOAL #1   Title Pt will demo gross hip strength to increase to at least 4+/5 by 7/13   Baseline see flowsheet   Time 4   Period Weeks   Status New     PT SHORT TERM GOAL #2   Title Pt will be able to perform R single knee to chest without pinching   Baseline pinching on anterior aspect of hip at eval   Time 4   Period Weeks  Status New           PT Long Term Goals - 02/27/17 1731      PT LONG TERM GOAL #1   Title FOTO to % limiation to indicate significant improvement in functional ability by 7/27   Baseline % limitation at eval   Time 6   Period Weeks   Status New     PT LONG TERM GOAL #2   Title Pt will demo proper form with use of gym equipment in order to transition to independent workout program    Baseline will educate as appropriate through POC   Time 6   Period Weeks   Status New     PT LONG TERM GOAL #3   Title Pt will be able to move from sit to stand without limitaiton by sensation of "stiffness"   Baseline feels stiff at eval   Time 6   Period Weeks   Status New     PT LONG TERM GOAL #4   Title Pt will be able to complete all household chores and self care activities without limitation by back and hip pain   Baseline limitations are intermittent but severe when they occur    Time 6   Period Weeks   Status New               Plan - 03/12/17 1242    Clinical Impression Statement Clicking deep in joint sounding similar to labral tear, increased pain with scour test. Difficulty activating glut med in sidelying today, was able to feel contractions in supine.    PT Treatment/Interventions ADLs/Self Care Home Management;Cryotherapy;Electrical Stimulation;Iontophoresis 4mg /ml Dexamethasone;Functional mobility training;Gait training;Ultrasound;Moist Heat;Therapeutic activities;Therapeutic exercise;Balance training;Neuromuscular re-education;Patient/family education;Passive range of motion;Manual techniques;Dry needling;Taping   PT Next Visit Plan core, glut strengthening.  Quadriped.     PT Home Exercise Plan figure 4 stretch, clam, roller R ITB;  Supine clam,  QL stretch on side; bridge with ball squeeze, hooklying clam   Consulted and Agree with Plan of Care Patient      Patient will benefit from skilled therapeutic intervention in order to improve the following deficits and impairments:  Increased muscle spasms, Decreased activity tolerance, Pain, Improper body mechanics, Impaired flexibility, Decreased strength, Postural dysfunction, Decreased range of motion  Visit Diagnosis: Chronic bilateral low back pain, with sciatica presence unspecified  Pain in right hip  Pain in left hip  Muscle weakness (generalized)     Problem List Patient Active Problem List   Diagnosis Date Noted  . H/O scoliosis 01/25/2017  . Back pain 01/01/2017  . Grief 01/01/2017    Shaquanta Harkless C. Tayana Shankle PT, DPT 03/12/17 12:46 PM   [facility] [address] [place], [place], [address] Phone: [phone]   Fax:  [phone]  Name: Amber Green MRN: [id number] Date of Birth: 10/07/1965

## 2017-03-15 ENCOUNTER — Encounter: Payer: Self-pay | Admitting: Physical Therapy

## 2017-03-15 ENCOUNTER — Ambulatory Visit: Payer: BC Managed Care – PPO | Admitting: Physical Therapy

## 2017-03-15 DIAGNOSIS — M545 Low back pain: Principal | ICD-10-CM

## 2017-03-15 DIAGNOSIS — M6281 Muscle weakness (generalized): Secondary | ICD-10-CM

## 2017-03-15 DIAGNOSIS — G8929 Other chronic pain: Secondary | ICD-10-CM

## 2017-03-15 DIAGNOSIS — M25551 Pain in right hip: Secondary | ICD-10-CM

## 2017-03-15 DIAGNOSIS — M25552 Pain in left hip: Secondary | ICD-10-CM

## 2017-03-15 NOTE — Therapy (Signed)
[place], [place], [address] Phone: [phone]   Fax:  [phone]  Physical Therapy Treatment  Patient Details  Name: Amber Green MRN: [id number] Date of Birth: Sep 07, 1966 Referring Provider: Flossie Buffy, NP  Encounter Date: 03/15/2017      PT End of Session - 03/15/17 1147    Visit Number 5   Number of Visits 13   Date for PT Re-Evaluation 03/30/17   Authorization Type BCBS no visit limit   PT Start Time 1147   PT Stop Time 1227   PT Time Calculation (min) 40 min   Activity Tolerance Patient tolerated treatment well   Behavior During Therapy [facility] for tasks assessed/performed      Past Medical History:  Diagnosis Date  . Clostridium difficile colitis 2003  . Scoliosis     Past Surgical History:  Procedure Laterality Date  . ABDOMINAL HYSTERECTOMY  1987  . APPENDECTOMY  1987  . 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 RSO  1987   right ovaria cyst removal    There were no vitals filed for this visit.      Subjective Assessment - 03/15/17 1147    Subjective Reports hips felt pretty good after last visit. Stiff and kind of sore with certain activities. Has not noticed clicking.    Currently in Pain? No/denies                         [facility] Adult PT Treatment/Exercise - 03/15/17 0001      Therapeutic Activites    Therapeutic Activities ADL's   ADL's sit to stand form     Knee/Hip Exercises: Stretches   Hip Flexor Stretch Limitations seated edge of mat     Knee/Hip Exercises: Aerobic   Nustep 5 min L5     Knee/Hip Exercises: Supine   Bridges with Clamshell 20 reps  x10 iso clam, x10 with 3 clam pulls each   Straight Leg Raises 10 reps;Both   Straight Leg Raise with External Rotation Both;10 reps   Other Supine Knee/Hip Exercises hooklying clam yellow tband     Manual Therapy   Manual therapy comments edu pt in use of tennis ball for illiopsoas   Soft tissue  mobilization IASTM R illiopsoas                PT Education - 03/15/17 1232    Education provided Yes   Education Details exercise form/rationale, manual rationale, importance of frequent movement at work, [employer]) Educated Patient   Methods Explanation;Demonstration;Tactile cues;Verbal cues;Handout   Comprehension Verbalized understanding;Returned demonstration;Verbal cues required;Tactile cues required;Need further instruction          PT Short Term Goals - 02/27/17 1729      PT SHORT TERM GOAL #1   Title Pt will demo gross hip strength to increase to at least 4+/5 by 7/13   Baseline see flowsheet   Time 4   Period Weeks   Status New     PT SHORT TERM GOAL #2   Title Pt will be able to perform R single knee to chest without pinching   Baseline pinching on anterior aspect of hip at eval   Time 4   Period Weeks   Status New           PT Long Term Goals - 02/27/17 1731      PT LONG TERM GOAL #1   Title FOTO to %  limiation to indicate significant improvement in functional ability by 7/27   Baseline % limitation at eval   Time 6   Period Weeks   Status New     PT LONG TERM GOAL #2   Title Pt will demo proper form with use of gym equipment in order to transition to independent workout program    Baseline will educate as appropriate through POC   Time 6   Period Weeks   Status New     PT LONG TERM GOAL #3   Title Pt will be able to move from sit to stand without limitaiton by sensation of "stiffness"   Baseline feels stiff at eval   Time 6   Period Weeks   Status New     PT LONG TERM GOAL #4   Title Pt will be able to complete all household chores and self care activities without limitation by back and hip pain   Baseline limitations are intermittent but severe when they occur   Time 6   Period Weeks   Status New               Plan - 03/15/17 1153    Clinical Impression Statement Pt unable to perform RLE SLR in ERdue to pain.  concordant pain found in illiopsoas and reduced with manual treatment. Pt demo compensation patterns in sit to stand which were corrected and done without pain today.    PT Treatment/Interventions ADLs/Self Care Home Management;Cryotherapy;Electrical Stimulation;Iontophoresis 4mg /ml Dexamethasone;Functional mobility training;Gait training;Ultrasound;Moist Heat;Therapeutic activities;Therapeutic exercise;Balance training;Neuromuscular re-education;Patient/family education;Passive range of motion;Manual techniques;Dry needling;Taping   PT Next Visit Plan hip abductor activation, check illiopsoas- add thomas test stretch?  remember sit to stand?   PT Home Exercise Plan figure 4 stretch, clam, roller R ITB;  Supine clam,  QL stretch on side; bridge with ball squeeze, hooklying clam   Consulted and Agree with Plan of Care Patient      Patient will benefit from skilled therapeutic intervention in order to improve the following deficits and impairments:  Increased muscle spasms, Decreased activity tolerance, Pain, Improper body mechanics, Impaired flexibility, Decreased strength, Postural dysfunction, Decreased range of motion  Visit Diagnosis: Chronic bilateral low back pain, with sciatica presence unspecified  Pain in right hip  Pain in left hip  Muscle weakness (generalized)     Problem List Patient Active Problem List   Diagnosis Date Noted  . H/O scoliosis 01/25/2017  . Back pain 01/01/2017  . Grief 01/01/2017   Colon Rueth C. Keymora Grillot PT, DPT 03/15/17 12:33 PM   [place] [facility] [address] [place], [place], [address] Phone: [phone]   Fax:  [phone]  Name: BRADLEE BRIDGERS MRN: [id number] Date of Birth: 19-Nov-1965

## 2017-03-19 ENCOUNTER — Encounter: Payer: Self-pay | Admitting: Physical Therapy

## 2017-03-19 ENCOUNTER — Ambulatory Visit: Payer: BC Managed Care – PPO | Attending: Nurse Practitioner | Admitting: Physical Therapy

## 2017-03-19 DIAGNOSIS — M6281 Muscle weakness (generalized): Secondary | ICD-10-CM | POA: Diagnosis present

## 2017-03-19 DIAGNOSIS — M25551 Pain in right hip: Secondary | ICD-10-CM

## 2017-03-19 DIAGNOSIS — M545 Low back pain: Secondary | ICD-10-CM | POA: Insufficient documentation

## 2017-03-19 DIAGNOSIS — G8929 Other chronic pain: Secondary | ICD-10-CM | POA: Diagnosis present

## 2017-03-19 DIAGNOSIS — M25552 Pain in left hip: Secondary | ICD-10-CM | POA: Diagnosis present

## 2017-03-19 NOTE — Therapy (Signed)
[place], [place], [address] Phone: [phone]   Fax:  [phone]  Physical Therapy Treatment  Patient Details  Name: Amber Green MRN: [id number] Date of Birth: 12/09/1965 Referring Provider: Flossie Buffy, NP  Encounter Date: 03/19/2017      PT End of Session - 03/19/17 1145    Visit Number 6   Number of Visits 13   Date for PT Re-Evaluation 03/30/17   Authorization Type BCBS no visit limit   PT Start Time 1146   PT Stop Time 1230   PT Time Calculation (min) 44 min   Activity Tolerance Patient tolerated treatment well   Behavior During Therapy [facility] for tasks assessed/performed      Past Medical History:  Diagnosis Date  . Clostridium difficile colitis 2003  . Scoliosis     Past Surgical History:  Procedure Laterality Date  . ABDOMINAL HYSTERECTOMY  1987  . APPENDECTOMY  1987  . 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 RSO  1987   right ovaria cyst removal    There were no vitals filed for this visit.      Subjective Assessment - 03/19/17 1146    Subjective Pt reports some bruising after trigger point release at illiopsoas. Pain on R thigh with neutral SLR, was only able to do 8, felt better with leg ext rotated. Sore after doing yard work and washing car.    Patient Stated Goals sit to stand, learn to manage, avoid THA   Currently in Pain? No/denies                         [facility] Adult PT Treatment/Exercise - 03/19/17 0001      Exercises   Exercises Other Exercises     Lumbar Exercises: Supine   Bent Knee Raise 15 reps   Bent Knee Raise Limitations bilat with ball bw knees     Lumbar Exercises: Quadruped   Other Quadruped Lumbar Exercises bent knee hip ext; hip ER with heels touching     Knee/Hip Exercises: Stretches   Passive Hamstring Stretch Both;30 seconds   Passive Hamstring Stretch Limitations supine with green strap   Hip Flexor Stretch Limitations  seated corner of mat   Other Knee/Hip Stretches seated figure 4     Knee/Hip Exercises: Aerobic   Nustep 5 min L5     Knee/Hip Exercises: Supine   Bridges with Clamshell 15 reps  each with R/L single leg clam, yellow tband   Straight Leg Raises Both;20 reps   Straight Leg Raises Limitations SAQ to SLR over small bolster   Other Supine Knee/Hip Exercises hooklying clam yellow tband, alt single leg                PT Education - 03/19/17 1232    Education provided Yes   Education Details exercise form/rationale. stand from chair & pausing before walking, management in car, importance of frequent mobility, abdominal stabilization to work hips   Person(s) Educated Patient   Methods Explanation;Demonstration;Tactile cues;Verbal cues;Handout   Comprehension Verbalized understanding;Returned demonstration;Verbal cues required;Tactile cues required;Need further instruction          PT Short Term Goals - 02/27/17 1729      PT SHORT TERM GOAL #1   Title Pt will demo gross hip strength to increase to at least 4+/5 by 7/13   Baseline see flowsheet   Time 4   Period Weeks   Status New  PT SHORT TERM GOAL #2   Title Pt will be able to perform R single knee to chest without pinching   Baseline pinching on anterior aspect of hip at eval   Time 4   Period Weeks   Status New           PT Long Term Goals - 02/27/17 1731      PT LONG TERM GOAL #1   Title FOTO to % limiation to indicate significant improvement in functional ability by 7/27   Baseline % limitation at eval   Time 6   Period Weeks   Status New     PT LONG TERM GOAL #2   Title Pt will demo proper form with use of gym equipment in order to transition to independent workout program    Baseline will educate as appropriate through POC   Time 6   Period Weeks   Status New     PT LONG TERM GOAL #3   Title Pt will be able to move from sit to stand without limitaiton by sensation of "stiffness"   Baseline  feels stiff at eval   Time 6   Period Weeks   Status New     PT LONG TERM GOAL #4   Title Pt will be able to complete all household chores and self care activities without limitation by back and hip pain   Baseline limitations are intermittent but severe when they occur   Time 6   Period Weeks   Status New               Plan - 03/19/17 1247    Clinical Impression Statement Good tolerance to exercises today. Found that pt was losing quad set and utilizing hip flexors in SLR, decreased pain with corrections today. Pt reported feeling better after leaving.    PT Treatment/Interventions ADLs/Self Care Home Management;Cryotherapy;Electrical Stimulation;Iontophoresis 4mg /ml Dexamethasone;Functional mobility training;Gait training;Ultrasound;Moist Heat;Therapeutic activities;Therapeutic exercise;Balance training;Neuromuscular re-education;Patient/family education;Passive range of motion;Manual techniques;Dry needling;Taping   PT Next Visit Plan did she try pillow in car?  hip abductor strength   PT Home Exercise Plan figure 4 stretch, clam, roller R ITB;  Supine clam,  QL stretch on side; bridge with ball squeeze, hooklying clam   Consulted and Agree with Plan of Care Patient      Patient will benefit from skilled therapeutic intervention in order to improve the following deficits and impairments:  Increased muscle spasms, Decreased activity tolerance, Pain, Improper body mechanics, Impaired flexibility, Decreased strength, Postural dysfunction, Decreased range of motion  Visit Diagnosis: Chronic bilateral low back pain, with sciatica presence unspecified  Pain in right hip  Pain in left hip  Muscle weakness (generalized)     Problem List Patient Active Problem List   Diagnosis Date Noted  . H/O scoliosis 01/25/2017  . Back pain 01/01/2017  . Grief 01/01/2017    Farin Buhman C. Yanna Leaks PT, DPT 03/19/17 12:50 PM   [facility] [address] [place], [place], [address] Phone: [phone]   Fax:  [phone]  Name: Amber Green MRN: [id number] Date of Birth: Feb 05, 1966

## 2017-03-26 ENCOUNTER — Ambulatory Visit: Payer: BC Managed Care – PPO | Admitting: Physical Therapy

## 2017-03-26 ENCOUNTER — Encounter: Payer: Self-pay | Admitting: Physical Therapy

## 2017-03-26 ENCOUNTER — Telehealth: Payer: Self-pay | Admitting: Nurse Practitioner

## 2017-03-26 DIAGNOSIS — G8929 Other chronic pain: Secondary | ICD-10-CM

## 2017-03-26 DIAGNOSIS — M6281 Muscle weakness (generalized): Secondary | ICD-10-CM

## 2017-03-26 DIAGNOSIS — M25551 Pain in right hip: Secondary | ICD-10-CM

## 2017-03-26 DIAGNOSIS — M25552 Pain in left hip: Secondary | ICD-10-CM

## 2017-03-26 DIAGNOSIS — M545 Low back pain: Principal | ICD-10-CM

## 2017-03-26 NOTE — Therapy (Signed)
[place], [place], [address] Phone: [phone]   Fax:  [phone]  Physical Therapy Treatment  Patient Details  Name: Amber Green MRN: [id number] Date of Birth: [birth year] Referring Provider: Flossie Buffy, NP  Encounter Date: 03/26/2017      PT End of Session - 03/26/17 1312    Visit Number 7   Number of Visits 13   Date for PT Re-Evaluation 03/30/17   PT Start Time 1517   PT Stop Time 1232   PT Time Calculation (min) 43 min   Activity Tolerance Patient tolerated treatment well   Behavior During Therapy [facility] for tasks assessed/performed      Past Medical History:  Diagnosis Date  . Clostridium difficile colitis 2003  . Scoliosis     Past Surgical History:  Procedure Laterality Date  . ABDOMINAL HYSTERECTOMY  1987  . APPENDECTOMY  1987  . 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 RSO  1987   right ovaria cyst removal    There were no vitals filed for this visit.      Subjective Assessment - 03/26/17 1153    Subjective Able to walk 30 minutes. Increased to 4/10 right hoip/  low back.  last night 6/10 woke her up Shot may be fading.   Pain Relieving Factors sleeping with pillow between knees.             [facility] PT Assessment - 03/26/17 0001      Strength   Right Hip ABduction 4/5                     [facility] Adult PT Treatment/Exercise - 03/26/17 0001      Therapeutic Activites    Therapeutic Activities Other Therapeutic Activities   Other Therapeutic Activities sitting 1/2 on floded towel to shift weight onto right bottock.  which may level out  her hips and/or decrease strain from her scoliosis,  walking cued for increased arm swing     Lumbar Exercises: Supine   Bent Knee Raise 15 reps   Bent Knee Raise Limitations bilat with ball bw knees  monitored for technique.  Abdominal pain from workout.    Bridge 10 reps   Other Supine Lumbar Exercises leg lengthener 5 X 5  seconds  right only     Lumbar Exercises: Quadruped   Other Quadruped Lumbar Exercises bent knee hip ext; hip ER with heels touching  10 each ex each side     Knee/Hip Exercises: Stretches   Other Knee/Hip Stretches seated figure 4  2 reps 30 seconds   Other Knee/Hip Stretches leg lengthener 5 X 5 seconds right only     Knee/Hip Exercises: Seated   Sit to Sand 10 reps     Manual Therapy   Soft tissue mobilization right lateral hip tissue congested and stiff initially                PT Education - 03/26/17 1310    Education Details cues for sitting and walking   Person(s) Educated Patient   Methods Explanation;Demonstration   Comprehension Verbalized understanding;Returned demonstration          PT Short Term Goals - 03/26/17 1316      PT SHORT TERM GOAL #1   Title Pt will demo gross hip strength to increase to at least 4+/5 by 7/13   Baseline 4/5 right hip abduction   Time 4   Period Weeks   Status On-going  PT SHORT TERM GOAL #2   Title Pt will be able to perform R single knee to chest without pinching   Baseline able to do   Time 4   Period Weeks   Status Achieved           PT Long Term Goals - 02/27/17 1731      PT LONG TERM GOAL #1   Title FOTO to % limiation to indicate significant improvement in functional ability by 7/27   Baseline % limitation at eval   Time 6   Period Weeks   Status New     PT LONG TERM GOAL #2   Title Pt will demo proper form with use of gym equipment in order to transition to independent workout program    Baseline will educate as appropriate through POC   Time 6   Period Weeks   Status New     PT LONG TERM GOAL #3   Title Pt will be able to move from sit to stand without limitaiton by sensation of "stiffness"   Baseline feels stiff at eval   Time 6   Period Weeks   Status New     PT LONG TERM GOAL #4   Title Pt will be able to complete all household chores and self care activities without limitation by  back and hip pain   Baseline limitations are intermittent but severe when they occur   Time 6   Period Weeks   Status New               Plan - 03/26/17 1312    Clinical Impression Statement No pain today.  Stiffness continues.  She had a pain flare to 6/10 last night in bed in her hip.She has not been able to fing a pillow for her car.   She is able to flex right hip and not get a pinch/  Hip abduction right 4/5/   PT Treatment/Interventions ADLs/Self Care Home Management;Cryotherapy;Electrical Stimulation;Iontophoresis 4mg /ml Dexamethasone;Functional mobility training;Gait training;Ultrasound;Moist Heat;Therapeutic activities;Therapeutic exercise;Balance training;Neuromuscular re-education;Patient/family education;Passive range of motion;Manual techniques;Dry needling;Taping   PT Next Visit Plan did she try pillow in car?  hip abductor strength   PT Home Exercise Plan figure 4 stretch, clam, roller R ITB;  Supine clam,  QL stretch on side; bridge with ball squeeze, hooklying clam   Consulted and Agree with Plan of Care Patient      Patient will benefit from skilled therapeutic intervention in order to improve the following deficits and impairments:  Increased muscle spasms, Decreased activity tolerance, Pain, Improper body mechanics, Impaired flexibility, Decreased strength, Postural dysfunction, Decreased range of motion  Visit Diagnosis: Chronic bilateral low back pain, with sciatica presence unspecified  Pain in right hip  Pain in left hip  Muscle weakness (generalized)     Problem List Patient Active Problem List   Diagnosis Date Noted  . H/O scoliosis 01/25/2017  . Back pain 01/01/2017  . Grief 01/01/2017    Hailee Hollick PTA 03/26/2017, 1:18 PM  [facility] [address] [place], [place], [address] Phone: [phone]   Fax:  [phone]  Name: Amber Green MRN: [id number] Date of Birth: Jun 10, 1966

## 2017-03-26 NOTE — Telephone Encounter (Signed)
I can not give another injection in right hip in less than 67months. I can give injection in left hip only.

## 2017-03-26 NOTE — Telephone Encounter (Signed)
Spoke with pt, she said that I hip injection works but it seems to be wearing off: little pain came back at night but pt tolerance this pain ok. She also working with PT until the end of July 2018--this seem to help as well. Pt doesn't want to take 2 meds we prescribed.   Major concern is a long car ride trip (10 hrs drive) thats coming up in 04/2017--she was wondering if she can get another injection before the trip? Or what suggestion Baldo Ash has to help with this pain.

## 2017-03-26 NOTE — Telephone Encounter (Signed)
Pt called in and said that she has a follow up question about the shot she was given may the 10th.     Best number [phone]

## 2017-03-27 NOTE — Telephone Encounter (Signed)
Referral entered, appt made with Dr. Tamala Julian. Pt is aware.

## 2017-03-27 NOTE — Telephone Encounter (Signed)
I can not guarantee that another provider will do injection. She will have to be reassessed by that provider and they determine if injection is appropriate or not. Please enter referral to sport medicine. Thank you

## 2017-03-27 NOTE — Telephone Encounter (Signed)
Pt wants to get the injection on the right hip,perfer not to take meds and doesn't wants to do the left hip.   She wants to schedule an appt with you on 8/10 but you are not in the office. Do you know of another doctor who can do this injection or can she see someone else? Please advise.

## 2017-03-28 ENCOUNTER — Ambulatory Visit: Payer: BC Managed Care – PPO | Admitting: Physical Therapy

## 2017-03-28 ENCOUNTER — Encounter: Payer: Self-pay | Admitting: Physical Therapy

## 2017-03-28 DIAGNOSIS — M6281 Muscle weakness (generalized): Secondary | ICD-10-CM

## 2017-03-28 DIAGNOSIS — M545 Low back pain: Secondary | ICD-10-CM | POA: Diagnosis not present

## 2017-03-28 DIAGNOSIS — M25552 Pain in left hip: Secondary | ICD-10-CM

## 2017-03-28 DIAGNOSIS — M25551 Pain in right hip: Secondary | ICD-10-CM

## 2017-03-28 DIAGNOSIS — G8929 Other chronic pain: Secondary | ICD-10-CM

## 2017-03-28 NOTE — Therapy (Signed)
[place], [place], [address] Phone: [phone]   Fax:  [phone]  Physical Therapy Treatment  Patient Details  Name: Amber Green MRN: [id number] Date of Birth: 1965-09-25 Referring Provider: Flossie Buffy, NP  Encounter Date: 03/28/2017      PT End of Session - 03/28/17 1145    Visit Number 8   Number of Visits 13   Date for PT Re-Evaluation 03/30/17   Authorization Type BCBS no visit limit   PT Start Time 1145   PT Stop Time 1233   PT Time Calculation (min) 48 min   Activity Tolerance Patient tolerated treatment well   Behavior During Therapy [facility] for tasks assessed/performed      Past Medical History:  Diagnosis Date  . Clostridium difficile colitis 2003  . Scoliosis     Past Surgical History:  Procedure Laterality Date  . ABDOMINAL HYSTERECTOMY  1987  . APPENDECTOMY  1987  . 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 RSO  1987   right ovaria cyst removal    There were no vitals filed for this visit.      Subjective Assessment - 03/28/17 1149    Subjective reports ache in hip and into low back after walking for exercise, pushed through it and it felt better later after some self massage. Is having some night pain again. afraid of pain with long car ride coming up. Tried sitting on a magazine at work, no difference noted yet but it has only been 2 days.                          [facility] Adult PT Treatment/Exercise - 03/28/17 0001      Exercises   Exercises Lumbar     Lumbar Exercises: Stretches   Prone Mid Back Stretch Limitations at counter-deep breathing, center & R hand over L     Lumbar Exercises: Standing   Other Standing Lumbar Exercises 5 min treadmil-3# in R hand     Lumbar Exercises: Seated   Sit to Stand 10 reps   Sit to Stand Limitations 6lb in R hand for L oblique activation     Lumbar Exercises: Supine   Bent Knee Raise Limitations exhale R arm to L  knee   Isometric Hip Flexion Limitations dead bug ext + iso press hip flx with ball   Other Supine Lumbar Exercises crunches-variable options     Lumbar Exercises: Sidelying   Other Sidelying Lumbar Exercises L side plank-elbow & knees                PT Education - 03/28/17 1248    Education provided Yes   Education Details exercise form/rationale, exercise surfaces, pillow in car, shortening on R/lengthening in L obliques   Person(s) Educated Patient   Methods Demonstration;Explanation;Tactile cues;Verbal cues;Handout   Comprehension Verbalized understanding;Returned demonstration;Verbal cues required;Tactile cues required;Need further instruction          PT Short Term Goals - 03/26/17 1316      PT SHORT TERM GOAL #1   Title Pt will demo gross hip strength to increase to at least 4+/5 by 7/13   Baseline 4/5 right hip abduction   Time 4   Period Weeks   Status On-going     PT SHORT TERM GOAL #2   Title Pt will be able to perform R single knee to chest without pinching   Baseline able to do  Time 4   Period Weeks   Status Achieved           PT Long Term Goals - 02/27/17 1731      PT LONG TERM GOAL #1   Title FOTO to % limiation to indicate significant improvement in functional ability by 7/27   Baseline % limitation at eval   Time 6   Period Weeks   Status New     PT LONG TERM GOAL #2   Title Pt will demo proper form with use of gym equipment in order to transition to independent workout program    Baseline will educate as appropriate through POC   Time 6   Period Weeks   Status New     PT LONG TERM GOAL #3   Title Pt will be able to move from sit to stand without limitaiton by sensation of "stiffness"   Baseline feels stiff at eval   Time 6   Period Weeks   Status New     PT LONG TERM GOAL #4   Title Pt will be able to complete all household chores and self care activities without limitation by back and hip pain   Baseline limitations are  intermittent but severe when they occur   Time 6   Period Weeks   Status New               Plan - 03/28/17 1240    Clinical Impression Statement No hip pain today. Worked to activate L obliques today to improve biomechanical chain activation & decrease pull from shortening on R side.    PT Treatment/Interventions ADLs/Self Care Home Management;Cryotherapy;Electrical Stimulation;Iontophoresis 4mg /ml Dexamethasone;Functional mobility training;Gait training;Ultrasound;Moist Heat;Therapeutic activities;Therapeutic exercise;Balance training;Neuromuscular re-education;Patient/family education;Passive range of motion;Manual techniques;Dry needling;Taping   PT Next Visit Plan oblique activation   PT Home Exercise Plan figure 4 stretch, clam, roller R ITB;  Supine clam,  QL stretch on side; bridge with ball squeeze, hooklying clam; L side plank, crunches, lumbar flexion stretch   Consulted and Agree with Plan of Care Patient      Patient will benefit from skilled therapeutic intervention in order to improve the following deficits and impairments:  Increased muscle spasms, Decreased activity tolerance, Pain, Improper body mechanics, Impaired flexibility, Decreased strength, Postural dysfunction, Decreased range of motion  Visit Diagnosis: Chronic bilateral low back pain, with sciatica presence unspecified  Pain in right hip  Pain in left hip  Muscle weakness (generalized)     Problem List Patient Active Problem List   Diagnosis Date Noted  . H/O scoliosis 01/25/2017  . Back pain 01/01/2017  . Grief 01/01/2017    Ashford Clouse C. Teauna Dubach PT, DPT 03/28/17 12:52 PM   [place] [facility] [address] [place], [place], [address] Phone: [phone]   Fax:  [phone]  Name: KOURTNEI RAUBER MRN: [id number] Date of Birth: 1965/12/02

## 2017-03-30 ENCOUNTER — Ambulatory Visit (AMBULATORY_SURGERY_CENTER): Payer: Self-pay

## 2017-03-30 VITALS — Ht 71.0 in | Wt 198.6 lb

## 2017-03-30 DIAGNOSIS — Z1211 Encounter for screening for malignant neoplasm of colon: Secondary | ICD-10-CM

## 2017-03-30 NOTE — Progress Notes (Signed)
Denies allergies to eggs or soy products. Denies complication of anesthesia or sedation. Denies use of weight loss medication. Denies use of O2.   Emmi instructions given for colonoscopy.  

## 2017-04-02 ENCOUNTER — Encounter: Payer: Self-pay | Admitting: Internal Medicine

## 2017-04-02 ENCOUNTER — Ambulatory Visit: Payer: BC Managed Care – PPO | Admitting: Physical Therapy

## 2017-04-02 ENCOUNTER — Encounter: Payer: Self-pay | Admitting: Physical Therapy

## 2017-04-02 DIAGNOSIS — M6281 Muscle weakness (generalized): Secondary | ICD-10-CM

## 2017-04-02 DIAGNOSIS — M545 Low back pain: Secondary | ICD-10-CM | POA: Diagnosis not present

## 2017-04-02 DIAGNOSIS — M25551 Pain in right hip: Secondary | ICD-10-CM

## 2017-04-02 DIAGNOSIS — M25552 Pain in left hip: Secondary | ICD-10-CM

## 2017-04-02 DIAGNOSIS — G8929 Other chronic pain: Secondary | ICD-10-CM

## 2017-04-02 NOTE — Therapy (Signed)
[place], [place], [address] Phone: [phone]   Fax:  [phone]  Physical Therapy Treatment  Patient Details  Name: Amber Green MRN: [id number] Date of Birth: 1965-12-23 Referring Provider: Flossie Buffy, NP  Encounter Date: 04/02/2017      PT End of Session - 04/02/17 1148    Visit Number 9   Number of Visits 13   Date for PT Re-Evaluation 03/30/17   Authorization Type BCBS no visit limit   PT Start Time 1148   PT Stop Time 1227   PT Time Calculation (min) 39 min   Activity Tolerance Patient tolerated treatment well   Behavior During Therapy [facility] for tasks assessed/performed      Past Medical History:  Diagnosis Date  . Arthritis   . Clostridium difficile colitis 2003  . Scoliosis     Past Surgical History:  Procedure Laterality Date  . ABDOMINAL HYSTERECTOMY  1987  . APPENDECTOMY  1987  . 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 Ovary Removed Right   . RSO  1987   right ovaria cyst removal    There were no vitals filed for this visit.      Subjective Assessment - 04/02/17 1151    Subjective Some pain sleeping over the weekend but it wasn't bad. Feels about the same, feels she is progressing and this is all good but not a lot of change being made. A little quick stab in R hip on bike.    Patient Stated Goals sit to stand, learn to manage, avoid THA   Currently in Pain? No/denies                         [facility] Adult PT Treatment/Exercise - 04/02/17 0001      Lumbar Exercises: Aerobic   Stationary Bike 5 min L3  2 min post manual treatment     Knee/Hip Exercises: Stretches   Other Knee/Hip Stretches figure 4     Manual Therapy   Manual Therapy Myofascial release   Soft tissue mobilization IASTM R glut min/med, TFL, piriformis   Myofascial Release trigger point release R glut med                PT Education - 04/02/17 1229    Education provided Yes    Education Details TPDN (pt chose not to perform), trigger points & connection to hip pain, scoliosis/surgery & connection to hips, sleeping posture, long term care.    Person(s) Educated Patient   Methods Explanation   Comprehension Verbalized understanding;Need further instruction          PT Short Term Goals - 03/26/17 1316      PT SHORT TERM GOAL #1   Title Pt will demo gross hip strength to increase to at least 4+/5 by 7/13   Baseline 4/5 right hip abduction   Time 4   Period Weeks   Status On-going     PT SHORT TERM GOAL #2   Title Pt will be able to perform R single knee to chest without pinching   Baseline able to do   Time 4   Period Weeks   Status Achieved           PT Long Term Goals - 02/27/17 1731      PT LONG TERM GOAL #1   Title FOTO to % limiation to indicate significant improvement in functional ability by 7/27   Baseline %  limitation at eval   Time 6   Period Weeks   Status New     PT LONG TERM GOAL #2   Title Pt will demo proper form with use of gym equipment in order to transition to independent workout program    Baseline will educate as appropriate through POC   Time 6   Period Weeks   Status New     PT LONG TERM GOAL #3   Title Pt will be able to move from sit to stand without limitaiton by sensation of "stiffness"   Baseline feels stiff at eval   Time 6   Period Weeks   Status New     PT LONG TERM GOAL #4   Title Pt will be able to complete all household chores and self care activities without limitation by back and hip pain   Baseline limitations are intermittent but severe when they occur   Time 6   Period Weeks   Status New               Plan - 04/02/17 1239    Clinical Impression Statement Trigger point noted in R glut med and released, was able to demo greater hip ext rot and ride the bike without sharp pain following. Discussed getting a body pillow for sleep comfort.    PT Treatment/Interventions ADLs/Self Care  Home Management;Cryotherapy;Electrical Stimulation;Iontophoresis 4mg /ml Dexamethasone;Functional mobility training;Gait training;Ultrasound;Moist Heat;Therapeutic activities;Therapeutic exercise;Balance training;Neuromuscular re-education;Patient/family education;Passive range of motion;Manual techniques;Dry needling;Taping   PT Next Visit Plan core strength, hip post trigger point release?   PT Home Exercise Plan figure 4 stretch, clam, roller R ITB;  Supine clam,  QL stretch on side; bridge with ball squeeze, hooklying clam; L side plank, crunches, lumbar flexion stretch   Consulted and Agree with Plan of Care Patient      Patient will benefit from skilled therapeutic intervention in order to improve the following deficits and impairments:  Increased muscle spasms, Decreased activity tolerance, Pain, Improper body mechanics, Impaired flexibility, Decreased strength, Postural dysfunction, Decreased range of motion  Visit Diagnosis: Chronic bilateral low back pain, with sciatica presence unspecified  Pain in right hip  Pain in left hip  Muscle weakness (generalized)     Problem List Patient Active Problem List   Diagnosis Date Noted  . H/O scoliosis 01/25/2017  . Back pain 01/01/2017  . Grief 01/01/2017    Estela Vinal C. Pressley Barsky PT, DPT 04/02/17 12:44 PM   [facility] [address] [place], [place], [address] Phone: [phone]   Fax:  [phone]  Name: Amber Green MRN: [id number] Date of Birth: [birth year]

## 2017-04-04 ENCOUNTER — Encounter: Payer: Self-pay | Admitting: Physical Therapy

## 2017-04-04 ENCOUNTER — Ambulatory Visit: Payer: BC Managed Care – PPO | Admitting: Physical Therapy

## 2017-04-04 DIAGNOSIS — M6281 Muscle weakness (generalized): Secondary | ICD-10-CM

## 2017-04-04 DIAGNOSIS — M25552 Pain in left hip: Secondary | ICD-10-CM

## 2017-04-04 DIAGNOSIS — M545 Low back pain: Secondary | ICD-10-CM | POA: Diagnosis not present

## 2017-04-04 DIAGNOSIS — M25551 Pain in right hip: Secondary | ICD-10-CM

## 2017-04-04 DIAGNOSIS — G8929 Other chronic pain: Secondary | ICD-10-CM

## 2017-04-04 NOTE — Therapy (Signed)
[name], [place], [address] Phone: [phone]   Fax:  [phone]  Physical Therapy Treatment  Patient Details  Name: Amber Green MRN: [id number] Date of Birth: 11/28/1965 Referring Provider: Flossie Buffy, NP  Encounter Date: 04/04/2017      PT End of Session - 04/04/17 1147    Visit Number 10   Number of Visits 13   Date for PT Re-Evaluation 03/30/17   Authorization Type BCBS no visit limit   PT Start Time 1147   PT Stop Time 1230   PT Time Calculation (min) 43 min   Activity Tolerance Patient tolerated treatment well   Behavior During Therapy [facility] for tasks assessed/performed      Past Medical History:  Diagnosis Date  . Arthritis   . Clostridium difficile colitis 2003  . Scoliosis     Past Surgical History:  Procedure Laterality Date  . ABDOMINAL HYSTERECTOMY  1987  . APPENDECTOMY  1987  . 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 Ovary Removed Right   . RSO  1987   right ovaria cyst removal    There were no vitals filed for this visit.      Subjective Assessment - 04/04/17 1147    Subjective Reports some bruising after trigger point release last visit. Today having pain in low back on R side. 14 hr work day yesterday mostly seated in a chair. Did get a body pillow and a yoga mat.    Patient Stated Goals sit to stand, learn to manage, avoid THA   Currently in Pain? Yes   Pain Score 5    Pain Location Back   Pain Orientation Right;Lower   Aggravating Factors  standing after sitting.                          [facility] Adult PT Treatment/Exercise - 04/04/17 0001      Lumbar Exercises: Stretches   Single Knee to Chest Stretch Limitations R side   Prone Mid Back Stretch Limitations at counter-deep breathing, center & R hand over L   Piriformis Stretch Limitations R side     Lumbar Exercises: Aerobic   Stationary Bike 5 min L4     Knee/Hip Exercises: Seated    Other Seated Knee/Hip Exercises iso L arm lat press into yellow tband ball     Knee/Hip Exercises: Supine   Bridges with Ball Squeeze 15 reps  in dorsiflexion     Knee/Hip Exercises: Sidelying   Hip ABduction Limitations abduction + extension x15 each                PT Education - 04/04/17 1157    Education provided Yes   Education Details car ride & importance of regular stops, long duration positions, damage 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 work days, sleeping Primary school teacher, exercise form/rationale   Person(s) Educated Patient   Methods Explanation;Demonstration;Tactile cues;Verbal cues   Comprehension Verbalized understanding;Returned demonstration;Verbal cues required;Tactile cues required;Need further instruction          PT Short Term Goals - 03/26/17 1316      PT SHORT TERM GOAL #1   Title Pt will demo gross hip strength to increase to at least 4+/5 by 7/13   Baseline 4/5 right hip abduction   Time 4   Period Weeks   Status On-going     PT SHORT TERM GOAL #2   Title Pt will be able to perform R single  knee to chest without pinching   Baseline able to do   Time 4   Period Weeks   Status Achieved           PT Long Term Goals - 02/27/17 1731      PT LONG TERM GOAL #1   Title FOTO to % limiation to indicate significant improvement in functional ability by 7/27   Baseline % limitation at eval   Time 6   Period Weeks   Status New     PT LONG TERM GOAL #2   Title Pt will demo proper form with use of gym equipment in order to transition to independent workout program    Baseline will educate as appropriate through POC   Time 6   Period Weeks   Status New     PT LONG TERM GOAL #3   Title Pt will be able to move from sit to stand without limitaiton by sensation of "stiffness"   Baseline feels stiff at eval   Time 6   Period Weeks   Status New     PT LONG TERM GOAL #4   Title Pt will be able to complete all household chores and self care activities  without limitation by back and hip pain   Baseline limitations are intermittent but severe when they occur   Time 6   Period Weeks   Status New               Plan - 04/04/17 1242    Clinical Impression Statement Significant tightness noted in R QL today and reduced with manual treatment. Discussed long duration postures and importance of awareness due to limited mobility for lumbar stretching. Pain at end range hip flexion that is resolved when leg is slightly adducted.    PT Treatment/Interventions ADLs/Self Care Home Management;Cryotherapy;Electrical Stimulation;Iontophoresis 4mg /ml Dexamethasone;Functional mobility training;Gait training;Ultrasound;Moist Heat;Therapeutic activities;Therapeutic exercise;Balance training;Neuromuscular re-education;Patient/family education;Passive range of motion;Manual techniques;Dry needling;Taping   PT Next Visit Plan core strengthening-L oblique activation.    PT Home Exercise Plan figure 4 stretch, clam, roller R ITB;  Supine clam,  QL stretch on side; bridge with ball squeeze, hooklying clam; L side plank, crunches, lumbar flexion stretch   Consulted and Agree with Plan of Care Patient      Patient will benefit from skilled therapeutic intervention in order to improve the following deficits and impairments:  Increased muscle spasms, Decreased activity tolerance, Pain, Improper body mechanics, Impaired flexibility, Decreased strength, Postural dysfunction, Decreased range of motion  Visit Diagnosis: Chronic bilateral low back pain, with sciatica presence unspecified  Pain in right hip  Pain in left hip  Muscle weakness (generalized)     Problem List Patient Active Problem List   Diagnosis Date Noted  . H/O scoliosis 01/25/2017  . Back pain 01/01/2017  . Grief 01/01/2017   Amber Green 04/04/17 12:48 PM   [facility] [address] [place], [place],  [address] Phone: [phone]   Fax:  [phone]  Name: Amber Green MRN: [id number] Date of Birth: [birth year]

## 2017-04-06 ENCOUNTER — Ambulatory Visit (AMBULATORY_SURGERY_CENTER): Payer: BC Managed Care – PPO | Admitting: Internal Medicine

## 2017-04-06 ENCOUNTER — Encounter: Payer: Self-pay | Admitting: Internal Medicine

## 2017-04-06 VITALS — BP 116/78 | HR 63 | Temp 98.9°F | Resp 16 | Ht 71.0 in | Wt 197.0 lb

## 2017-04-06 DIAGNOSIS — Z1211 Encounter for screening for malignant neoplasm of colon: Secondary | ICD-10-CM

## 2017-04-06 DIAGNOSIS — Z1212 Encounter for screening for malignant neoplasm of rectum: Secondary | ICD-10-CM

## 2017-04-06 MED ORDER — SODIUM CHLORIDE 0.9 % IV SOLN: 500.0000 mL | INTRAVENOUS | Status: DC

## 2017-04-06 NOTE — Op Note (Signed)
[facility] Patient Name: Shalynn Jorstad Procedure Date: 04/06/2017 10:38 AM MRN: [id number] Endoscopist: Gatha Mayer , MD Age: 51 Referring MD:  Date of Birth: [birth year] Gender: Female Account #: [id number] Procedure:                Colonoscopy Indications:              Screening for colorectal malignant neoplasm, This                            is the patient's first colonoscopy Medicines:                Propofol per Anesthesia, Monitored Anesthesia Care Procedure:                Pre-Anesthesia Assessment:                           - Prior to the procedure, a History and Physical                            was performed, and patient medications and                            allergies were reviewed. The patient's tolerance of                            previous anesthesia was also reviewed. The risks                            and benefits of the procedure and the sedation                            options and risks were discussed with the patient.                            All questions were answered, and informed consent                            was obtained. Prior Anticoagulants: The patient has                            taken no previous anticoagulant or antiplatelet                            agents. ASA Grade Assessment: II - A patient with                            mild systemic disease. After reviewing the risks                            and benefits, the patient was deemed in                            satisfactory condition to undergo the procedure.  After obtaining informed consent, the colonoscope                            was passed under direct vision. Throughout the                            procedure, the patient's blood pressure, pulse, and                            oxygen saturations were monitored continuously. The                            Colonoscope was introduced through the anus and   advanced to the the cecum, identified by                            appendiceal orifice and ileocecal valve. The                            colonoscopy was performed without difficulty. The                            patient tolerated the procedure well. The quality                            of the bowel preparation was good. The bowel                            preparation used was Miralax. The ileocecal valve,                            appendiceal orifice, and rectum were photographed. Scope In: 10:41:42 AM Scope Out: 10:57:42 AM Scope Withdrawal Time: 0 hours 11 minutes 50 seconds  Total Procedure Duration: 0 hours 16 minutes 0 seconds  Findings:                 The perianal and digital rectal examinations were                            normal.                           Multiple small and large-mouthed diverticula were                            found in the sigmoid colon.                           The exam was otherwise without abnormality on                            direct and retroflexion views. Complications:            No immediate complications. Estimated Blood Loss:     Estimated blood loss: none. Impression:               -  Diverticulosis in the sigmoid colon.                           - The examination was otherwise normal on direct                            and retroflexion views.                           - No specimens collected. Recommendation:           - Patient has a contact number available for                            emergencies. The signs and symptoms of potential                            delayed complications were discussed with the                            patient. Return to normal activities tomorrow.                            Written discharge instructions were provided to the                            patient.                           - Resume previous diet.                           - Continue present medications.                           -  Repeat colonoscopy in 10 years for screening                            purposes. Gatha Mayer, MD 04/06/2017 11:02:26 AM This report has been signed electronically.

## 2017-04-06 NOTE — Progress Notes (Signed)
Report to PACU, RN, vss, BBS= Clear.  

## 2017-04-06 NOTE — Patient Instructions (Addendum)
No polyps, no cancer seen. You do have diverticulosis - thickened muscle rings and pouches in the colon wall. Please read the handout about this condition.  Next routine colonoscopy or other screening test in 10 years - 2028  I appreciate the opportunity to care for you. Gatha Mayer, MD, [facility]   Discharge instructions given. Handout on diverticulosis. Resume previous medications. YOU HAD AN ENDOSCOPIC PROCEDURE TODAY AT [facility]:   Refer to the procedure report that was given to you for any specific questions about what was found during the examination.  If the procedure report does not answer your questions, please call your gastroenterologist to clarify.  If you requested that your care partner not be given the details of your procedure findings, then the procedure report has been included in a sealed envelope for you to review at your convenience later.  YOU SHOULD EXPECT: Some feelings of bloating in the abdomen. Passage of more gas than usual.  Walking can help get rid of the air that was put into your GI tract during the procedure and reduce the bloating. If you had a lower endoscopy (such as a colonoscopy or flexible sigmoidoscopy) you may notice spotting of blood in your stool or on the toilet paper. If you underwent a bowel prep for your procedure, you may not have a normal bowel movement for a few days.  Please Note:  You might notice some irritation and congestion in your nose or some drainage.  This is from the oxygen used during your procedure.  There is no need for concern and it should clear up in a day or so.  SYMPTOMS TO REPORT IMMEDIATELY:   Following lower endoscopy (colonoscopy or flexible sigmoidoscopy):  Excessive amounts of blood in the stool  Significant tenderness or worsening of abdominal pains  Swelling of the abdomen that is new, acute  Fever of 100F or higher  For urgent or emergent issues, a gastroenterologist can be reached at  any hour by calling [phone].   DIET:  We do recommend a small meal at first, but then you may proceed to your regular diet.  Drink plenty of fluids but you should avoid alcoholic beverages for 24 hours.  ACTIVITY:  You should plan to take it easy for the rest of today and you should NOT DRIVE or use heavy machinery until tomorrow (because of the sedation medicines used during the test).    FOLLOW UP: Our staff will call the number listed on your records the next business day following your procedure to check on you and address any questions or concerns that you may have regarding the information given to you following your procedure. If we do not reach you, we will leave a message.  However, if you are feeling well and you are not experiencing any problems, there is no need to return our call.  We will assume that you have returned to your regular daily activities without incident.  If any biopsies were taken you will be contacted by phone or by letter within the next 1-3 weeks.  Please call us at [phone] if you have not heard about the biopsies in 3 weeks.    SIGNATURES/CONFIDENTIALITY: You and/or your care partner have signed paperwork which will be entered into your electronic medical record.  These signatures attest to the fact that that the information above on your After Visit Summary has been reviewed and is understood.  Full responsibility of the confidentiality of this  discharge information lies with you and/or your care-partner. 

## 2017-04-09 ENCOUNTER — Ambulatory Visit: Payer: BC Managed Care – PPO | Admitting: Physical Therapy

## 2017-04-09 ENCOUNTER — Encounter: Payer: Self-pay | Admitting: Physical Therapy

## 2017-04-09 ENCOUNTER — Telehealth: Payer: Self-pay

## 2017-04-09 DIAGNOSIS — G8929 Other chronic pain: Secondary | ICD-10-CM

## 2017-04-09 DIAGNOSIS — M25551 Pain in right hip: Secondary | ICD-10-CM

## 2017-04-09 DIAGNOSIS — M545 Low back pain: Principal | ICD-10-CM

## 2017-04-09 DIAGNOSIS — M25552 Pain in left hip: Secondary | ICD-10-CM

## 2017-04-09 DIAGNOSIS — M6281 Muscle weakness (generalized): Secondary | ICD-10-CM

## 2017-04-09 NOTE — Telephone Encounter (Signed)
  Follow up Call-  Call back number 04/06/2017  Post procedure Call Back phone  # [phone]  Permission to leave phone message Yes  Some recent data might be hidden     Patient questions:  Do you have a fever, pain , or abdominal swelling? No. Pain Score  0 *  Have you tolerated food without any problems? Yes.    Have you been able to return to your normal activities? Yes.    Do you have any questions about your discharge instructions: Diet   No. Medications  No. Follow up visit  No.  Do you have questions or concerns about your Care? No.  Actions: * If pain score is 4 or above: No action needed, pain <4.

## 2017-04-09 NOTE — Therapy (Signed)
[place], [place], [address] Phone: [phone]   Fax:  [phone]  Physical Therapy Treatment  Patient Details  Name: Amber Green MRN: [id number] Date of Birth: Jun 14, 1966 Referring Provider: Flossie Buffy, NP  Encounter Date: 04/09/2017      PT End of Session - 04/09/17 1257    Visit Number 11   Number of Visits 13   Date for PT Re-Evaluation 03/30/17   PT Start Time 1149   PT Stop Time 1234   PT Time Calculation (min) 45 min   Activity Tolerance Patient tolerated treatment well   Behavior During Therapy [facility] for tasks assessed/performed      Past Medical History:  Diagnosis Date  . Arthritis   . Clostridium difficile colitis 2003  . Scoliosis     Past Surgical History:  Procedure Laterality Date  . ABDOMINAL HYSTERECTOMY  1987  . APPENDECTOMY  1987  . 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 Ovary Removed Right   . RSO  1987   right ovaria cyst removal    There were no vitals filed for this visit.      Subjective Assessment - 04/09/17 1248    Subjective I felt so good after the last appointment I could have cried.  I noticed less stiffness getting up from sitting today.   Currently in Pain? No/denies   Pain Location Back   Pain Orientation Right;Lower   Pain Descriptors / Indicators Tender   Pain Frequency Intermittent   Aggravating Factors  standing after sitting.  sleeping on right hip   Pain Relieving Factors massage   Multiple Pain Sites No                         [facility] Adult PT Treatment/Exercise - 04/09/17 0001      Lumbar Exercises: Supine   Bridge 10 reps   Large Ball Oblique Isometric 10 reps     Knee/Hip Exercises: Supine   Bridges with Ball Squeeze 10 reps     Knee/Hip Exercises: Sidelying   Hip ABduction Limitations abduction + extension x15 each     Manual Therapy   Manual Therapy Myofascial release  Used massage chair   Manual therapy  comments tissue softened . increased flexibility   Soft tissue mobilization right gluteal, soft tissue work,  right hip   Myofascial Release right low back ,  gluteal                  PT Short Term Goals - 04/09/17 1301      PT SHORT TERM GOAL #1   Title Pt will demo gross hip strength to increase to at least 4+/5 by 7/13   Time 4   Period Weeks   Status Unable to assess     PT SHORT TERM GOAL #2   Title Pt will be able to perform R single knee to chest without pinching   Time 4   Period Weeks   Status Achieved           PT Long Term Goals - 04/09/17 1301      PT LONG TERM GOAL #1   Title FOTO to % limiation to indicate significant improvement in functional ability by 7/27   Time 6   Period Weeks   Status On-going     PT LONG TERM GOAL #2   Title Pt will demo proper form with use of gym equipment in order  to transition to independent workout program    Time 6   Period Weeks   Status On-going     PT LONG TERM GOAL #3   Title Pt will be able to move from sit to stand without limitaiton by sensation of "stiffness"   Baseline improving   Time 6   Period Weeks   Status Partially Met     PT LONG TERM GOAL #4   Title Pt will be able to complete all household chores and self care activities without limitation by back and hip pain   Time 6   Period Weeks   Status Unable to assess               Plan - 04/09/17 1257    Clinical Impression Statement Less tightness in QL, however still tight. Manual helpful.  Progress toward LTG#3 with less stiffness woith sit to stand.  No increased pain post session.  She declined the need for Modalities.   PT Next Visit Plan core strengthening-L oblique activation. Patient wants to know if she can schedule an appointment for massage the day befor she leaves for her trip of refer her to someone in the community for a therapeutic massage is no longer needing PT.    PT Home Exercise Plan figure 4 stretch, clam, roller R  ITB;  Supine clam,  QL stretch on side; bridge with ball squeeze, hooklying clam; L side plank, crunches, lumbar flexion stretch   Consulted and Agree with Plan of Care Patient      Patient will benefit from skilled therapeutic intervention in order to improve the following deficits and impairments:  Increased muscle spasms, Decreased activity tolerance, Pain, Improper body mechanics, Impaired flexibility, Decreased strength, Postural dysfunction, Decreased range of motion  Visit Diagnosis: Chronic bilateral low back pain, with sciatica presence unspecified  Pain in right hip  Pain in left hip  Muscle weakness (generalized)     Problem List Patient Active Problem List   Diagnosis Date Noted  . H/O scoliosis 01/25/2017  . Back pain 01/01/2017  . Grief 01/01/2017    HARRIS,KAREN PTA 04/09/2017, 1:03 PM  [facility] [address] [place], [place], [address] Phone: [phone]   Fax:  [phone]  Name: Amber Green MRN: [id number] Date of Birth: 09-23-1965

## 2017-04-11 ENCOUNTER — Encounter: Payer: Self-pay | Admitting: Physical Therapy

## 2017-04-11 ENCOUNTER — Ambulatory Visit: Payer: BC Managed Care – PPO | Admitting: Physical Therapy

## 2017-04-11 DIAGNOSIS — M6281 Muscle weakness (generalized): Secondary | ICD-10-CM

## 2017-04-11 DIAGNOSIS — M25551 Pain in right hip: Secondary | ICD-10-CM

## 2017-04-11 DIAGNOSIS — G8929 Other chronic pain: Secondary | ICD-10-CM

## 2017-04-11 DIAGNOSIS — M545 Low back pain: Secondary | ICD-10-CM | POA: Diagnosis not present

## 2017-04-11 DIAGNOSIS — M25552 Pain in left hip: Secondary | ICD-10-CM

## 2017-04-11 NOTE — Therapy (Signed)
[facility] [address] [place], [place], [address] Phone: [phone]   Fax:  [phone]  Physical Therapy Treatment/Discharge Summary  Patient Details  Name: Amber Green MRN: [id number] Date of Birth: Jul 11, 1966 Referring Provider: Anne Ng, NP  Encounter Date: 04/11/2017      PT End of Session - 04/11/17 1149    Visit Number 12   Number of Visits 13   Authorization Type [employer]BS no visit limit   PT Start Time 1150   PT Stop Time 1223   PT Time Calculation (min) 33 min   Activity Tolerance Patient tolerated treatment well   Behavior During Therapy [facility] for tasks assessed/performed      Past Medical History:  Diagnosis Date  . Arthritis   . Clostridium difficile colitis 2003  . Scoliosis     Past Surgical History:  Procedure Laterality Date  . ABDOMINAL HYSTERECTOMY  1987  . APPENDECTOMY  1987  . 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 Ovary Removed Right   . RSO  1987   right ovaria cyst removal    There were no vitals filed for this visit.      Subjective Assessment - 04/11/17 1150    Subjective Went swimming on Sunday and felt really good after.             [facility] PT Assessment - 04/11/17 0001      Strength   Right Hip Flexion 5/5   Right Hip Extension 5/5   Right Hip ABduction 4+/5   Left Hip Flexion 4+/5   Left Hip Extension 4+/5   Left Hip ABduction 4+/5                             PT Education - 04/11/17 1226    Education provided Yes   Education Details use of tennis ball for self massage, massage options in community, importance of long term management, options for stretching on long car ride, computer posture, progress with strength and goals   Person(s) Educated Patient   Methods Explanation;Demonstration   Comprehension Verbalized understanding;Returned demonstration          PT Short Term Goals - 04/11/17 1225      PT SHORT TERM GOAL #1   Title Pt  will demo gross hip strength to increase to at least 4+/5 by 7/13   Baseline see flowhseet   Status Achieved     PT SHORT TERM GOAL #2   Title Pt will be able to perform R single knee to chest without pinching   Baseline able to do   Status Achieved           PT Long Term Goals - 04/11/17 1203      PT LONG TERM GOAL #1   Title FOTO to 28% limiation to indicate significant improvement in functional ability by 7/27   Baseline 34% limitation at d/c, improved from 40% limitation   Status Partially Met     PT LONG TERM GOAL #2   Title Pt will demo proper form with use of gym equipment in order to transition to independent workout program    Status Achieved     PT LONG TERM GOAL #3   Title Pt will be able to move from sit to stand without limitaiton by sensation of "stiffness"   Baseline feels better   Status Achieved     PT LONG TERM GOAL #4   Title  Pt will be able to complete all household chores and self care activities without limitation by back and hip pain   Baseline able without limitation   Status Achieved               Plan - 04/11/17 1228    Clinical Impression Statement Pt reports feeling really good and is prepared for d/c to independent program. Discussion held regarding long term management and options for regular massage treatments. Pt was instructed to contact us with any further questions.    PT Treatment/Interventions ADLs/Self Care Home Management;Cryotherapy;Electrical Stimulation;Iontophoresis 44m/ml Dexamethasone;Functional mobility training;Gait training;Ultrasound;Moist Heat;Therapeutic activities;Therapeutic exercise;Balance training;Neuromuscular re-education;Patient/family education;Passive range of motion;Manual techniques;Dry needling;Taping   Consulted and Agree with Plan of Care Patient      Patient will benefit from skilled therapeutic intervention in order to improve the following deficits and impairments:  Increased muscle spasms,  Decreased activity tolerance, Pain, Improper body mechanics, Impaired flexibility, Decreased strength, Postural dysfunction, Decreased range of motion  Visit Diagnosis: Chronic bilateral low back pain, with sciatica presence unspecified  Pain in right hip  Pain in left hip  Muscle weakness (generalized)     Problem List Patient Active Problem List   Diagnosis Date Noted  . H/O scoliosis 01/25/2017  . Back pain 01/01/2017  . Grief 01/01/2017   PHYSICAL THERAPY DISCHARGE SUMMARY  Visits from Start of Care: 12  Current functional level related to goals / functional outcomes: See above   Remaining deficits: See above   Education / Equipment: Anatomy of condition, POC, HEP, exercise form/rationale  Plan: Patient agrees to discharge.  Patient goals were met. Patient is being discharged due to meeting the stated rehab goals.  ?????    Audre Cenci C. Meron Bocchino PT, DPT 04/11/17 12:31 PM   [place] [address]Phone: [phone]  Fax:  [phone] Name: Amber RYERMRN: [id number] of Birth: [birth year]

## 2017-04-17 ENCOUNTER — Ambulatory Visit: Payer: BC Managed Care – PPO | Admitting: Family Medicine

## 2017-05-18 ENCOUNTER — Ambulatory Visit (INDEPENDENT_AMBULATORY_CARE_PROVIDER_SITE_OTHER): Payer: BC Managed Care – PPO | Admitting: Nurse Practitioner

## 2017-05-18 ENCOUNTER — Encounter: Payer: Self-pay | Admitting: Nurse Practitioner

## 2017-05-18 ENCOUNTER — Other Ambulatory Visit: Payer: Self-pay | Admitting: Internal Medicine

## 2017-05-18 VITALS — BP 102/70 | HR 62 | Temp 97.6°F | Ht 71.0 in | Wt 196.0 lb

## 2017-05-18 DIAGNOSIS — G8929 Other chronic pain: Secondary | ICD-10-CM | POA: Diagnosis not present

## 2017-05-18 DIAGNOSIS — M5441 Lumbago with sciatica, right side: Secondary | ICD-10-CM | POA: Diagnosis not present

## 2017-05-18 MED ORDER — PREDNISONE 20 MG PO TABS: ORAL_TABLET | ORAL | 0 refills | Status: DC

## 2017-05-18 MED ORDER — CYCLOBENZAPRINE HCL 10 MG PO TABS: 10.0000 mg | ORAL_TABLET | Freq: Every day | ORAL | 0 refills | Status: DC

## 2017-05-18 NOTE — Progress Notes (Signed)
Called from on-call service stating the prednisone didn't get sent infor whatever reason it was on print will send in

## 2017-05-18 NOTE — Progress Notes (Signed)
Subjective:  Patient ID: Amber Green, female    DOB: 06-Feb-1966  Age: 51 y.o. MRN: [id number]  CC: Hip Pain (right hip pain--painful near  "underwear line"--was better but drove 12 hrs--PT was good but exspensive---)   Back Pain  This is a recurrent problem. The current episode started more than 1 year ago. The problem occurs intermittently. The problem has been waxing and waning since onset. The pain is present in the lumbar spine. The quality of the pain is described as aching and cramping. The pain radiates to the right thigh. The symptoms are aggravated by lying down and sitting. Pertinent negatives include no bladder incontinence, bowel incontinence, leg pain, numbness, perianal numbness, tingling or weakness. Risk factors include poor posture and menopause. She has tried home exercises for the symptoms. The treatment provided no relief.  symptoms improved with PT. Worsened after long care ride. Did not take gabapentin and mobic as prescribed.  Outpatient Medications Prior to Visit  Medication Sig Dispense Refill  . gabapentin (NEURONTIN) 100 MG capsule Take 2 capsules (200 mg total) by mouth at bedtime. (Patient not taking: Reported on 04/06/2017) 60 capsule 1  . meloxicam (MOBIC) 7.5 MG tablet Take 1 tablet (7.5 mg total) by mouth daily as needed for pain. With food (Patient not taking: Reported on 04/06/2017) 30 tablet 0   Facility-Administered Medications Prior to Visit  Medication Dose Route Frequency Provider Last Rate Last Dose  . 0.9 %  sodium chloride infusion  500 mL Intravenous Continuous Gatha Mayer, MD        ROS See HPI  Objective:  BP 102/70   Pulse 62   Temp 97.6 F (36.4 C)   Ht 5\' 11"  (1.803 m)   Wt 196 lb (88.9 kg)   SpO2 99%   BMI 27.34 kg/m   BP Readings from Last 3 Encounters:  05/18/17 102/70  04/06/17 116/78  01/25/17 110/74    Wt Readings from Last 3 Encounters:  05/18/17 196 lb (88.9 kg)  04/06/17 197 lb (89.4 kg)  03/30/17 198 lb  9.6 oz (90.1 kg)    Physical Exam  Constitutional: She is oriented to person, place, and time. No distress.  Cardiovascular: Normal rate.   Pulmonary/Chest: Effort normal.  Abdominal: Soft. She exhibits no distension. There is no tenderness. Hernia confirmed negative in the right inguinal area and confirmed negative in the left inguinal area.  Musculoskeletal: She exhibits tenderness. She exhibits no edema.       Right hip: She exhibits tenderness.       Right knee: Normal.       Right ankle: Normal.       Lumbar back: She exhibits tenderness. She exhibits normal range of motion, no bony tenderness and normal pulse.       Right upper leg: Normal.  Normal gait. Right lumbar paraspinal muscle and gluteal muscle tenderness.  Lymphadenopathy:       Right: No inguinal adenopathy present.       Left: No inguinal adenopathy present.  Neurological: She is alert and oriented to person, place, and time.  Skin: Skin is warm and dry.  Psychiatric: She has a normal mood and affect. Her behavior is normal.  Vitals reviewed.   Lab Results  Component Value Date   WBC 7.0 10/19/2016   HGB 13.8 10/19/2016   HCT 42.6 10/19/2016   PLT 253 10/19/2016   GLUCOSE 75 10/19/2016   CHOL 170 10/19/2016   TRIG 72 10/19/2016   HDL 84  10/19/2016   LDLCALC 72 10/19/2016   ALT 15 10/19/2016   AST 15 10/19/2016   NA 142 10/19/2016   K 4.2 10/19/2016   CL 105 10/19/2016   CREATININE 1.08 (H) 10/19/2016   BUN 14 10/19/2016   CO2 24 10/19/2016   TSH 0.82 10/19/2016    Dg Lumbar Spine Complete  Result Date: 01/25/2017 CLINICAL DATA:  Lower back and bilateral hip pain for several weeks without known injury. EXAM: LUMBAR SPINE - COMPLETE 4+ VIEW COMPARISON:  Radiographs of January 07, 2007. FINDINGS: Moderate levoscoliosis of lumbar spine is noted. Grade 1 retrolisthesis of L3-4 is noted secondary to severe degenerative disc disease at that level. Moderate degenerative disc disease is noted at L1-2. No  fracture is noted. Harrington rod is seen along the left side of the upper lumbar spine. IMPRESSION: Moderate levoscoliosis. Multilevel degenerative disc disease. Harrington rod is noted. No acute abnormality seen in the lumbar spine. Electronically Signed   By: Marijo Conception, M.D.   On: 01/25/2017 13:28   Dg Hips Bilat With Pelvis 3-4 Views  Result Date: 01/25/2017 CLINICAL DATA:  Bilateral hip pain for several weeks without known injury. EXAM: DG HIP (WITH OR WITHOUT PELVIS) 3-4V BILAT COMPARISON:  None. FINDINGS: There is no evidence of hip fracture or dislocation. There is no evidence of arthropathy in the left hip. Focal ill-defined lucency is seen in the right femoral head which could represent avascular necrosis or possibly subchondral cyst formation from degenerative joint disease. IMPRESSION: No fracture or dislocation is noted. Ill-defined lucency is noted in right femoral head which may represent avascular necrosis or possibly subchondral cyst from degenerative joint disease. Electronically Signed   By: Marijo Conception, M.D.   On: 01/25/2017 13:31    Assessment & Plan:   Shelbie was seen today for hip pain.  Diagnoses and all orders for this visit:  Chronic right-sided low back pain with right-sided sciatica -     cyclobenzaprine (FLEXERIL) 10 MG tablet; Take 1 tablet (10 mg total) by mouth at bedtime. -     predniSONE (DELTASONE) 20 MG tablet; Take 3tabs on day 1,  Take 2tabs on day 2 & 3,  take 1tab on day 4 &5, then stop   I have discontinued Ms. Podolsky's meloxicam and gabapentin. I am also having her start on cyclobenzaprine and predniSONE. We will continue to administer sodium chloride.  Meds ordered this encounter  Medications  . cyclobenzaprine (FLEXERIL) 10 MG tablet    Sig: Take 1 tablet (10 mg total) by mouth at bedtime.    Dispense:  14 tablet    Refill:  0    Order Specific Question:   Supervising Provider    Answer:   Cassandria Anger [[id number]]  . predniSONE  (DELTASONE) 20 MG tablet    Sig: Take 3tabs on day 1,  Take 2tabs on day 2 & 3,  take 1tab on day 4 &5, then stop    Dispense:  9 tablet    Refill:  0    Order Specific Question:   Supervising Provider    Answer:   Cassandria Anger [[id number]]    Follow-up: Return if symptoms worsen or fail to improve.  Wilfred Lacy, NP

## 2017-05-18 NOTE — Patient Instructions (Signed)
Continue back exercise as directed by PT. Alternate between warm and cold compress as needed. Avoid prolonged sitting and standing.

## 2017-10-24 ENCOUNTER — Other Ambulatory Visit: Payer: Self-pay | Admitting: Obstetrics & Gynecology

## 2017-10-24 DIAGNOSIS — Z139 Encounter for screening, unspecified: Secondary | ICD-10-CM

## 2017-11-16 ENCOUNTER — Ambulatory Visit: Payer: BC Managed Care – PPO | Admitting: Nurse Practitioner

## 2017-11-16 ENCOUNTER — Encounter: Payer: Self-pay | Admitting: Nurse Practitioner

## 2017-11-16 VITALS — BP 106/68 | HR 92 | Temp 97.7°F | Ht 71.0 in | Wt 200.0 lb

## 2017-11-16 DIAGNOSIS — M5441 Lumbago with sciatica, right side: Secondary | ICD-10-CM | POA: Diagnosis not present

## 2017-11-16 DIAGNOSIS — G8929 Other chronic pain: Secondary | ICD-10-CM | POA: Diagnosis not present

## 2017-11-16 DIAGNOSIS — D1724 Benign lipomatous neoplasm of skin and subcutaneous tissue of left leg: Secondary | ICD-10-CM

## 2017-11-16 DIAGNOSIS — Z8739 Personal history of other diseases of the musculoskeletal system and connective tissue: Secondary | ICD-10-CM

## 2017-11-16 MED ORDER — GABAPENTIN 100 MG PO CAPS: 100.0000 mg | ORAL_CAPSULE | Freq: Every day | ORAL | 3 refills | Status: DC

## 2017-11-16 MED ORDER — IBUPROFEN-FAMOTIDINE 800-26.6 MG PO TABS: 1.0000 | ORAL_TABLET | Freq: Every evening | ORAL | 0 refills | Status: DC | PRN

## 2017-11-16 NOTE — Progress Notes (Signed)
Subjective:  Patient ID: Amber Green, female    DOB: 09/24/1965  Age: 52 y.o. MRN: [id number]  CC: Hip Pain (R hip pain going down toward thigh/ getting worse./ going on a trip July--hip inj consult?) and Mass (lump on left lower leg,painful/it is there for a while but it is painful to touch)   Hip Pain   The incident occurred more than 1 week ago. There was no injury mechanism. The pain is present in the right thigh and right hip. The quality of the pain is described as aching, cramping and shooting. The pain has been intermittent since onset. Associated symptoms include numbness and tingling. Pertinent negatives include no inability to bear weight, loss of motion, loss of sensation or muscle weakness. She reports no foreign bodies present. The symptoms are aggravated by movement and weight bearing. She has tried NSAIDs for the symptoms. The treatment provided mild relief.   some significant improvement with PT x 6weeks, water aerobic and sauna (Due to work schedule she is only able to go once a week) Worsening in last 76month with house work and prolonged sitting or laying Pain is worse at bedtime which leads to restlessness for poor sleep. Some relief with ibuprofen 400mg  and repositioning. Only able to sleep 2-4hrs a night.  Reports she does not like use of medication, hence did not take oral prednisone and flexeril prescribed 04/2017. She never used gabapentin either.  Outpatient Medications Prior to Visit  Medication Sig Dispense Refill  . cyclobenzaprine (FLEXERIL) 10 MG tablet Take 1 tablet (10 mg total) by mouth at bedtime. (Patient not taking: Reported on 11/16/2017) 14 tablet 0  . predniSONE (DELTASONE) 20 MG tablet 3tabs on day 1, Take 2tabs day 2 & 3, take 1tab on day 4 &5, then stop (Patient not taking: Reported on 11/16/2017) 9 tablet 0   Facility-Administered Medications Prior to Visit  Medication Dose Route Frequency Provider Last Rate Last Dose  . 0.9 %  sodium chloride  infusion  500 mL Intravenous Continuous Gatha Mayer, MD        ROS See HPI  Objective:  BP 106/68   Pulse 92   Temp 97.7 F (36.5 C)   Ht 5\' 11"  (1.803 m)   Wt 200 lb (90.7 kg)   SpO2 99%   BMI 27.89 kg/m   BP Readings from Last 3 Encounters:  11/16/17 106/68  05/18/17 102/70  04/06/17 116/78    Wt Readings from Last 3 Encounters:  11/16/17 200 lb (90.7 kg)  05/18/17 196 lb (88.9 kg)  04/06/17 197 lb (89.4 kg)    Physical Exam  Constitutional: She is oriented to person, place, and time. No distress.  Cardiovascular: Normal rate.  Pulmonary/Chest: Effort normal.  Musculoskeletal: She exhibits no edema or tenderness.  Normal gait  Neurological: She is alert and oriented to person, place, and time.  Skin: No erythema.     Psychiatric: She has a normal mood and affect. Her behavior is normal.  Vitals reviewed.   Lab Results  Component Value Date   WBC 7.0 10/19/2016   HGB 13.8 10/19/2016   HCT 42.6 10/19/2016   PLT 253 10/19/2016   GLUCOSE 75 10/19/2016   CHOL 170 10/19/2016   TRIG 72 10/19/2016   HDL 84 10/19/2016   LDLCALC 72 10/19/2016   ALT 15 10/19/2016   AST 15 10/19/2016   NA 142 10/19/2016   K 4.2 10/19/2016   CL 105 10/19/2016   CREATININE 1.08 (H) 10/19/2016  BUN 14 10/19/2016   CO2 24 10/19/2016   TSH 0.82 10/19/2016    Dg Lumbar Spine Complete  Result Date: 01/25/2017 CLINICAL DATA:  Lower back and bilateral hip pain for several weeks without known injury. EXAM: LUMBAR SPINE - COMPLETE 4+ VIEW COMPARISON:  Radiographs of January 07, 2007. FINDINGS: Moderate levoscoliosis of lumbar spine is noted. Grade 1 retrolisthesis of L3-4 is noted secondary to severe degenerative disc disease at that level. Moderate degenerative disc disease is noted at L1-2. No fracture is noted. Harrington rod is seen along the left side of the upper lumbar spine. IMPRESSION: Moderate levoscoliosis. Multilevel degenerative disc disease. Harrington rod is noted. No  acute abnormality seen in the lumbar spine. Electronically Signed   By: Marijo Conception, M.D.   On: 01/25/2017 13:28   Dg Hips Bilat With Pelvis 3-4 Views  Result Date: 01/25/2017 CLINICAL DATA:  Bilateral hip pain for several weeks without known injury. EXAM: DG HIP (WITH OR WITHOUT PELVIS) 3-4V BILAT COMPARISON:  None. FINDINGS: There is no evidence of hip fracture or dislocation. There is no evidence of arthropathy in the left hip. Focal ill-defined lucency is seen in the right femoral head which could represent avascular necrosis or possibly subchondral cyst formation from degenerative joint disease. IMPRESSION: No fracture or dislocation is noted. Ill-defined lucency is noted in right femoral head which may represent avascular necrosis or possibly subchondral cyst from degenerative joint disease. Electronically Signed   By: Marijo Conception, M.D.   On: 01/25/2017 13:31    Assessment & Plan:   Amber Green was seen today for hip pain and mass.  Diagnoses and all orders for this visit:  Chronic right-sided low back pain with right-sided sciatica -     gabapentin (NEURONTIN) 100 MG capsule; Take 1-2 capsules (100-200 mg total) by mouth at bedtime. -     Discontinue: Ibuprofen-Famotidine 800-26.6 MG TABS; Take 1 tablet by mouth at bedtime as needed.  H/O scoliosis -     gabapentin (NEURONTIN) 100 MG capsule; Take 1-2 capsules (100-200 mg total) by mouth at bedtime. -     Discontinue: Ibuprofen-Famotidine 800-26.6 MG TABS; Take 1 tablet by mouth at bedtime as needed.  Lipoma of left lower extremity   I have discontinued Amber Green's cyclobenzaprine, predniSONE, and Ibuprofen-Famotidine. I am also having her start on gabapentin. We will continue to administer sodium chloride.  Meds ordered this encounter  Medications  . gabapentin (NEURONTIN) 100 MG capsule    Sig: Take 1-2 capsules (100-200 mg total) by mouth at bedtime.    Dispense:  60 capsule    Refill:  3    Order Specific  Question:   Supervising Provider    Answer:   Lucille Passy [[id number]]  . DISCONTD: Ibuprofen-Famotidine 800-26.6 MG TABS    Sig: Take 1 tablet by mouth at bedtime as needed.    Dispense:  9 tablet    Refill:  0    Order Specific Question:   Supervising Provider    Answer:   Lucille Passy [[id number]]    Follow-up: Return if symptoms worsen or fail to improve.  Wilfred Lacy, NP

## 2017-11-16 NOTE — Assessment & Plan Note (Signed)
Hip x-ray indicates bursitis which is due to arthritis of hip joint. I will recommend for her to return to office for hip injection, and gabapentin at bedtime, use meloxicam prn. Will not prescribe muscle relaxant nor sports medicine as initially mentioned. I also entered referral for physical therapy.  Her pain could be due to trochanter bursitis and/or degenerative disc disease. If there is no improvement with injection, gabapentin, and physical therapy; she will need additional evaluation by spine specialist or return to her previous back surgeon.  Improved to bursa injection, PT, water aerobics, warm compress. She wants to minimize amount of medication used, hence decline oral prednisone, muscle relaxant and meloxicam.  Decided to try gabapentin x 2weeks. Refer to spine specialist if no improvement.

## 2017-11-16 NOTE — Patient Instructions (Signed)
Start gabapentin tonight. Increase dose to 200mg  if no improvement in 3days.  Call office for referral to spine specialty if no improvement in 2weeks.

## 2017-11-19 ENCOUNTER — Ambulatory Visit: Payer: Self-pay

## 2017-11-19 NOTE — Telephone Encounter (Signed)
Patient called in and says "the gabapentin I was prescribed at bedtime is not working for me. I always read before bed, then fall asleep. I woke up and took the pill thinking I would fall right back to sleep. My mind started racing and I could not fall asleep for about 1 1/2 hours after taking. It doesn't make me feel right, I can't describe the feeling. I'm also scared of all the side effects that are listed and don't need anything making me depressed. I am only taking 100 mg, so increasing to 200 mg is out of the question." I advised I would send this over to [facility] and someone will be in touch with her recommendation, she verbalized understanding.  Reason for Disposition . Caller has NON-URGENT medication question about med that PCP prescribed and triager unable to answer question  Answer Assessment - Initial Assessment Questions 1. SYMPTOMS: "Do you have any symptoms?"     Yes, insomnia 2. SEVERITY: If symptoms are present, ask "Are they mild, moderate or severe?"     Moderate  Protocols used: MEDICATION QUESTION CALL-A-AH

## 2017-11-19 NOTE — Telephone Encounter (Signed)
Routed to wrong practice.  

## 2017-11-19 NOTE — Telephone Encounter (Signed)
She can stop medication if she does not want to take it.

## 2017-11-20 NOTE — Telephone Encounter (Signed)
Pt is aware.  

## 2017-12-03 ENCOUNTER — Ambulatory Visit
Admission: RE | Admit: 2017-12-03 | Discharge: 2017-12-03 | Disposition: A | Discharge status: Home / Self Care | Payer: BC Managed Care – PPO | Source: Ambulatory Visit | Attending: Obstetrics & Gynecology | Admitting: Obstetrics & Gynecology

## 2017-12-03 DIAGNOSIS — Z139 Encounter for screening, unspecified: Secondary | ICD-10-CM

## 2018-01-02 ENCOUNTER — Ambulatory Visit: Payer: BC Managed Care – PPO | Admitting: Nurse Practitioner

## 2018-01-02 ENCOUNTER — Ambulatory Visit: Payer: BC Managed Care – PPO | Admitting: Family

## 2018-01-21 ENCOUNTER — Ambulatory Visit: Payer: BC Managed Care – PPO | Admitting: Obstetrics & Gynecology

## 2018-01-21 ENCOUNTER — Encounter: Payer: Self-pay | Admitting: Obstetrics & Gynecology

## 2018-01-21 VITALS — BP 124/70 | HR 84 | Resp 16 | Ht 70.0 in | Wt 186.0 lb

## 2018-01-21 DIAGNOSIS — Z0184 Encounter for antibody response examination: Secondary | ICD-10-CM | POA: Diagnosis not present

## 2018-01-21 DIAGNOSIS — Z01419 Encounter for gynecological examination (general) (routine) without abnormal findings: Secondary | ICD-10-CM | POA: Diagnosis not present

## 2018-01-21 NOTE — Progress Notes (Signed)
52 y.o. G2P2 SingleCaucasianF here for annual exam.  Doing ok.  Husband died about two years ago.  Adjusting.  Denies vaginal bleeding in a little over a year.  Has some occasional hot flashes.    Biggest issue in the past year has been issues with back pain when lying down in the bed.  She did see PCP for this.  Did x rays.  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  Was tested for gout, RA, and Lyme disease.  Hip replacement is a possibility but pt doesn't feel that it is last back.  Did do PT and this did really helped.  Using hot tub and water aerobics.  Has been working on active weight loss.  Down 15#.  Has questions about measles.  Would like to have antibody checked today.  PCP:  Wilfred Lacy, NP.    Patient's last menstrual period was 10/19/2016 (approximate).          Sexually active: No.  The current method of family planning is abstinence and post menopausal status.    Exercising: Yes.    walking, water aerobics  Smoker:  no  Health Maintenance: Pap:  10/19/16 Neg. HR HPV:neg   06/01/14 Neg  History of abnormal Pap:  no MMG:  12/03/17 BIRADS1:Neg  Colonoscopy:  04/06/17 Normal. F/u 10 years  BMD: Never TDaP:  08/2015 Pneumonia vaccine(s):  n/a Shingrix:   No.  Patient Hep C testing: n/a Screening Labs: PCP   reports that she has never smoked. She has never used smokeless tobacco. She reports that she drank about 0.5 - 1.0 oz of alcohol per week. She reports that she does not use drugs.  Past Medical History:  Diagnosis Date  . Arthritis   . Clostridium difficile colitis 2003  . Scoliosis     Past Surgical History:  Procedure Laterality Date  . ABDOMINAL HYSTERECTOMY  1987  . APPENDECTOMY  1987  . 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 Ovary Removed Right   . RSO  1987   right ovaria cyst removal    Current Outpatient Medications  Medication Sig Dispense Refill  . Melatonin 5 MG CAPS Take by mouth at bedtime.     No current facility-administered medications for  this visit.     Family History  Problem Relation Age of Onset  . Diabetes Maternal Grandfather   . Hypertension Maternal Grandfather   . Stroke Maternal Grandfather   . Lung cancer Maternal Grandmother   . Breast cancer Mother 74       mastectomy  . Hypothyroidism Mother   . Skin cancer Brother        not melanoma  . Hypertension Father   . Colon cancer Neg Hx   . Esophageal cancer Neg Hx   . Liver cancer Neg Hx   . Pancreatic cancer Neg Hx   . Rectal cancer Neg Hx   . Stomach cancer Neg Hx     Review of Systems  All other systems reviewed and are negative.   Exam:   BP 124/70 (BP Location: Right Arm, Patient Position: Sitting, Cuff Size: Normal)   Pulse 84   Resp 16   Ht 5\' 10"  (1.778 m)   Wt 186 lb (84.4 kg)   LMP 10/19/2016 (Approximate)   BMI 26.69 kg/m     Height: 5\' 10"  (177.8 cm)  Ht Readings from Last 3 Encounters:  01/21/18 5\' 10"  (1.778 m)  11/16/17 5\' 11"  (1.803 m)  05/18/17 5\' 11"  (  1.803 m)    General appearance: alert, cooperative and appears stated age Head: Normocephalic, without obvious abnormality, atraumatic Neck: no adenopathy, supple, symmetrical, trachea midline and thyroid normal to inspection and palpation Lungs: clear to auscultation bilaterally Breasts: normal appearance, no masses or tenderness Heart: regular rate and rhythm Abdomen: soft, non-tender; bowel sounds normal; no masses,  no organomegaly Extremities: extremities normal, atraumatic, no cyanosis or edema Skin: Skin color, texture, turgor normal. No rashes or lesions Lymph nodes: Cervical, supraclavicular, and axillary nodes normal. No abnormal inguinal nodes palpated Neurologic: Grossly normal   Pelvic: External genitalia:  no lesions              Urethra:  normal appearing urethra with no masses, tenderness or lesions              Bartholins and Skenes: normal                 Vagina: normal appearing vagina with normal color and discharge, no lesions              Cervix:  no lesions              Pap taken: No. Bimanual Exam:  Uterus:  normal size, contour, position, consistency, mobility, non-tender              Adnexa: normal adnexa and no mass, fullness, tenderness               Rectovaginal: Confirms               Anus:  normal sphincter tone, no lesions  Chaperone was present for exam.  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  UTD. pap smear and HR HPV neg 2018 Blood work not needed today Shingrix vaccination discussed.  Pt aware and is considering. Measles antibody obtained today Return annually or prn

## 2018-01-22 LAB — RUBEOLA ANTIBODY IGG: RUBEOLA AB, IGG: 263 AU/mL (ref >29.9)

## 2018-02-04 ENCOUNTER — Other Ambulatory Visit: Payer: Self-pay

## 2018-02-04 ENCOUNTER — Ambulatory Visit: Payer: BC Managed Care – PPO | Admitting: Obstetrics & Gynecology

## 2018-02-04 ENCOUNTER — Encounter: Payer: Self-pay | Admitting: Obstetrics & Gynecology

## 2018-02-04 ENCOUNTER — Telehealth: Payer: Self-pay | Admitting: Obstetrics & Gynecology

## 2018-02-04 VITALS — BP 106/78 | HR 68 | Resp 16 | Ht 70.0 in | Wt 183.6 lb

## 2018-02-04 DIAGNOSIS — R300 Dysuria: Secondary | ICD-10-CM

## 2018-02-04 LAB — POCT URINALYSIS DIPSTICK
BILIRUBIN UA: NEGATIVE
Blood, UA: NEGATIVE
GLUCOSE UA: NEGATIVE
Ketones, UA: NEGATIVE
Nitrite, UA: NEGATIVE
Protein, UA: NEGATIVE
Urobilinogen, UA: 0.2 E.U./dL
pH, UA: 6 (ref 5.0–8.0)

## 2018-02-04 MED ORDER — SULFAMETHOXAZOLE-TRIMETHOPRIM 800-160 MG PO TABS: 1.0000 | ORAL_TABLET | Freq: Two times a day (BID) | ORAL | 0 refills | Status: DC

## 2018-02-04 NOTE — Telephone Encounter (Signed)
Spoke with patient. Patient states that she woke up this morning feeling well. As the day has progressed she is having urinary frequency, urgency, and burning. Does not feel she is completely emptying her bladder. Denies fever or chills. Advised will need to be seen for further evaluation. Appointment scheduled for today at 3:30 pm with Dr.Miller. Patient is agreeable to date and time.  Routing to provider for final review. Patient agreeable to disposition. Will close encounter.

## 2018-02-04 NOTE — Progress Notes (Signed)
GYNECOLOGY  VISIT  CC:   Urgency, dysuria that started  HPI: 52 y.o. G2P2 Single Caucasian female here for dysuria, urgency and frequency since this morning.  Reports symptoms came on very quickly and she was sure she was developing a UTI.  However, she's had increased water today and now is not really having any symptoms at all.  Denies fever, back pain, or vaginal discharge.  Has not had any vaginal bleeding in over a year.  Not SA.   GYNECOLOGIC HISTORY: Patient's last menstrual period was 11/17/2016 (approximate). Contraception: hysterectomy  Menopausal hormone therapy: none  Patient Active Problem List   Diagnosis Date Noted  . Lipoma of left lower extremity 11/16/2017  . H/O scoliosis 01/25/2017  . Back pain 01/01/2017  . Grief 01/01/2017    Past Medical History:  Diagnosis Date  . Arthritis   . Clostridium difficile colitis 2003  . Scoliosis     Past Surgical History:  Procedure Laterality Date  . ABDOMINAL HYSTERECTOMY  1987  . APPENDECTOMY  1987  . 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 Ovary Removed Right   . RSO  1987   right ovaria cyst removal    MEDS:   Current Outpatient Medications on File Prior to Visit  Medication Sig Dispense Refill  . Melatonin 5 MG CAPS Take by mouth at bedtime.     No current facility-administered medications on file prior to visit.     ALLERGIES: Patient has no known allergies.  Family History  Problem Relation Age of Onset  . Diabetes Maternal Grandfather   . Hypertension Maternal Grandfather   . Stroke Maternal Grandfather   . Lung cancer Maternal Grandmother   . Breast cancer Mother 59       mastectomy  . Hypothyroidism Mother   . Skin cancer Brother        not melanoma  . Hypertension Father   . Colon cancer Neg Hx   . Esophageal cancer Neg Hx   . Liver cancer Neg Hx   . Pancreatic cancer Neg Hx   . Rectal cancer Neg Hx   . Stomach cancer Neg Hx     SH:  Widowed, non smoker  Review of Systems  Genitourinary:  Positive for dysuria, frequency and urgency.  All other systems reviewed and are negative.   PHYSICAL EXAMINATION:    BP 106/78 (BP Location: Right Arm, Patient Position: Sitting, Cuff Size: Normal)   Pulse 68   Resp 16   Ht 5\' 10"  (1.778 m)   Wt 183 lb 9.6 oz (83.3 kg)   LMP 11/17/2016 (Approximate)   BMI 26.34 kg/m     General appearance: alert, cooperative and appears stated age Flank:  No CVA tenderness Abdomen: soft, non-tender; bowel sounds normal; no masses,  no organomegaly  Pelvic: External genitalia:  no lesions              Urethra:  normal appearing urethra with no masses, tenderness or lesions              Bartholins and Skenes: normal                 Vagina: normal appearing vagina with normal color and discharge, no lesions              Cervix: no lesions              Bimanual Exam:  Uterus:  normal size, contour, position, consistency, mobility, non-tender  Adnexa: no mass, fullness, tenderness  Chaperone was present for exam.  Assessment: Dysuria and urgency earlier today, improved now  Plan: Urine micro and culture pending.  Results will be called to pt.   Rx for bactrim DS bid x 3 days sent to pharmacy.  If symptoms return, she will take this.  Also, AZO OTC discussed for bladder analgesia.

## 2018-02-04 NOTE — Progress Notes (Deleted)
GYNECOLOGY  VISIT  CC:   ***  HPI: 52 y.o. G2P2 Single Caucasian female here for dysuria, frequency, urgency since this morning.  GYNECOLOGIC HISTORY: Patient's last menstrual period was 11/17/2016 (approximate). Contraception: Hysterectomy  Menopausal hormone therapy: none  Patient Active Problem List   Diagnosis Date Noted  . Lipoma of left lower extremity 11/16/2017  . H/O scoliosis 01/25/2017  . Back pain 01/01/2017  . Grief 01/01/2017    Past Medical History:  Diagnosis Date  . Arthritis   . Clostridium difficile colitis 2003  . Scoliosis     Past Surgical History:  Procedure Laterality Date  . ABDOMINAL HYSTERECTOMY  1987  . APPENDECTOMY  1987  . 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 Ovary Removed Right   . RSO  1987   right ovaria cyst removal    MEDS:   Current Outpatient Medications on File Prior to Visit  Medication Sig Dispense Refill  . Melatonin 5 MG CAPS Take by mouth at bedtime.     No current facility-administered medications on file prior to visit.     ALLERGIES: Patient has no known allergies.  Family History  Problem Relation Age of Onset  . Diabetes Maternal Grandfather   . Hypertension Maternal Grandfather   . Stroke Maternal Grandfather   . Lung cancer Maternal Grandmother   . Breast cancer Mother 63       mastectomy  . Hypothyroidism Mother   . Skin cancer Brother        not melanoma  . Hypertension Father   . Colon cancer Neg Hx   . Esophageal cancer Neg Hx   . Liver cancer Neg Hx   . Pancreatic cancer Neg Hx   . Rectal cancer Neg Hx   . Stomach cancer Neg Hx     SH:  ***  Review of Systems  Genitourinary: Positive for dysuria, frequency and urgency.  All other systems reviewed and are negative.   PHYSICAL EXAMINATION:    BP 106/78 (BP Location: Right Arm, Patient Position: Sitting, Cuff Size: Normal)   Pulse 68   Resp 16   Ht 5\' 10"  (1.778 m)   Wt 183 lb 9.6 oz (83.3 kg)   LMP 11/17/2016 (Approximate)   BMI 26.34 kg/m      General appearance: alert, cooperative and appears stated age Neck: no adenopathy, supple, symmetrical, trachea midline and thyroid {CHL AMB PHY EX THYROID NORM DEFAULT:[phone]::"normal to inspection and palpation"} CV:  {Exam; heart brief:31539} Lungs:  {pe lungs ob:314451::"clear to auscultation, no wheezes, rales or rhonchi, symmetric air entry"} Breasts: {Exam; breast:13139::"normal appearance, no masses or tenderness"} Abdomen: soft, non-tender; bowel sounds normal; no masses,  no organomegaly  Pelvic: External genitalia:  no lesions              Urethra:  normal appearing urethra with no masses, tenderness or lesions              Bartholins and Skenes: normal                 Vagina: normal appearing vagina with normal color and discharge, no lesions              Cervix: {CHL AMB PHY EX CERVIX NORM [phone]::"no lesions"}              Bimanual Exam:  Uterus:  {CHL AMB PHY EX UTERUS NORM [phone]::"normal size, contour, position, consistency, mobility, non-tender"}              Adnexa: {CHL  AMB PHY EX ADNEXA NO MASS [phone]::"no mass, fullness, tenderness"}              Rectovaginal: {yes no:314532}.  Confirms.              Anus:  normal sphincter tone, no lesions  Chaperone was present for exam.  Assessment: ***  Plan: ***   ~{NUMBERS; -10-45 JOINT ROM:10287} minutes spent with patient >50% of time was in face to face discussion of above.

## 2018-02-04 NOTE — Telephone Encounter (Signed)
Patient thinks she have a uti and would like to be seen today with Dr.Miller. To triage to assist with scheduling.

## 2018-02-05 LAB — URINALYSIS, MICROSCOPIC ONLY: Casts: NONE SEEN /lpf

## 2018-02-05 LAB — URINE CULTURE

## 2018-02-12 ENCOUNTER — Telehealth: Payer: Self-pay | Admitting: *Deleted

## 2018-02-12 NOTE — Telephone Encounter (Signed)
Patient notified

## 2018-02-12 NOTE — Telephone Encounter (Signed)
LM for pt to call back.

## 2018-02-12 NOTE — Telephone Encounter (Signed)
-----   Message from Megan Salon, MD sent at 02/07/2018 10:37 PM EDT ----- Please let pt know her urine culture was negative.  However, the microscopic did show WBC cells.  She was going to see if symptoms worsened before taking the Bactrim.  Can you get an update from her?  Thanks.

## 2018-04-12 ENCOUNTER — Encounter: Payer: Self-pay | Admitting: Nurse Practitioner

## 2018-04-12 ENCOUNTER — Ambulatory Visit: Payer: BC Managed Care – PPO | Admitting: Nurse Practitioner

## 2018-04-12 VITALS — BP 100/68 | HR 84 | Temp 97.8°F | Ht 70.0 in | Wt 176.0 lb

## 2018-04-12 DIAGNOSIS — M79672 Pain in left foot: Secondary | ICD-10-CM | POA: Diagnosis not present

## 2018-04-12 DIAGNOSIS — R197 Diarrhea, unspecified: Secondary | ICD-10-CM

## 2018-04-12 DIAGNOSIS — M7061 Trochanteric bursitis, right hip: Secondary | ICD-10-CM | POA: Diagnosis not present

## 2018-04-12 MED ORDER — METHYLPREDNISOLONE ACETATE 40 MG/ML IJ SUSP
20.0000 mg | Freq: Once | INTRAMUSCULAR | Status: AC
Start: 2018-04-12 — End: 2018-04-12
  Administered 2018-04-12: 20 mg via INTRA_ARTICULAR

## 2018-04-12 NOTE — Progress Notes (Signed)
Subjective:  Patient ID: Amber Green, female    DOB: 1966-08-16  Age: 52 y.o. MRN: [id number]  CC: Follow-up (pt is follow up on hip pain, pt is going on 10 hrs flight and she is concern about the pain. left foot/heel pain, patient stated this has been going on for 1 mo, painful when push on it. ) and Diarrhea (pt is comlpaining of diarrhea or loss stool going on 1 mo. pt change her diet to help loss weight,limit carolies.?)  Diarrhea   This is a new problem. The current episode started 1 to 4 weeks ago. The problem occurs less than 2 times per day. The problem has been unchanged. The stool consistency is described as watery. The patient states that diarrhea does not awaken her from sleep. Pertinent negatives include no abdominal pain, bloating, chills, increased  flatus, myalgias, URI or vomiting. There are no known risk factors. She has tried nothing for the symptoms.  Hip Pain   There was no injury mechanism. The pain is present in the right hip. The quality of the pain is described as aching and burning. The pain has been intermittent since onset. Pertinent negatives include no inability to bear weight, loss of motion, loss of sensation, muscle weakness, numbness or tingling. The symptoms are aggravated by movement, palpation and weight bearing. She has tried ice, rest and NSAIDs for the symptoms. The treatment provided moderate relief.  Foot Injury   There was no injury mechanism. The pain is present in the left heel. Quality: sharp pain. The pain has been intermittent since onset. Pertinent negatives include no inability to bear weight, loss of motion, loss of sensation, muscle weakness, numbness or tingling. She reports no foreign bodies present. The symptoms are aggravated by weight bearing and palpation (worse in morning). She has tried NSAIDs for the symptoms. The treatment provided mild relief.    new diet (Plan Z diet-high protein, low carb, portion control) till 02/09/18, Diarrhea  started 03/18/2018.  Heel pain x 53month. No injury.   last right hip injection 01/2017. She will like another injection due to upcoming long plane trip: 10-12hrs  Reviewed past Medical, Social and Family history today.  Outpatient Medications Prior to Visit  Medication Sig Dispense Refill  . Melatonin 5 MG CAPS Take by mouth at bedtime.    . sulfamethoxazole-trimethoprim (BACTRIM DS,SEPTRA DS) 800-160 MG tablet Take 1 tablet by mouth 2 (two) times daily. (Patient not taking: Reported on 04/12/2018) 6 tablet 0   No facility-administered medications prior to visit.     ROS See HPI  Objective:  BP 100/68   Pulse 84   Temp 97.8 F (36.6 C) (Oral)   Ht 5\' 10"  (1.778 m)   Wt 176 lb (79.8 kg)   LMP 11/17/2016 (Approximate)   SpO2 97%   BMI 25.25 kg/m   BP Readings from Last 3 Encounters:  04/12/18 100/68  02/04/18 106/78  01/21/18 124/70    Wt Readings from Last 3 Encounters:  04/12/18 176 lb (79.8 kg)  02/04/18 183 lb 9.6 oz (83.3 kg)  01/21/18 186 lb (84.4 kg)    Physical Exam  Constitutional: She is oriented to person, place, and time. No distress.  Cardiovascular: Normal rate, regular rhythm and intact distal pulses.  Pulmonary/Chest: Effort normal and breath sounds normal.  Abdominal: Soft. Bowel sounds are normal. She exhibits no distension. There is no tenderness.  Musculoskeletal: She exhibits tenderness. She exhibits no edema.       Right hip: She  exhibits tenderness. She exhibits normal range of motion, normal strength, no bony tenderness and no crepitus.       Left ankle: Normal.       Right upper leg: Normal.       Right lower leg: Normal.       Left lower leg: Normal.       Right foot: Normal.       Left foot: There is tenderness. There is normal range of motion and no swelling.       Feet:  Neurological: She is alert and oriented to person, place, and time.  Skin: Skin is warm and dry. No rash noted. No erythema.  Psychiatric: She has a normal mood  and affect. Her behavior is normal. Thought content normal.  Vitals reviewed.   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 Verbal consent was obtained.  The patient was placed in a comfortable position. Lateral approach was used. Skin was prepped with Betadine and alcohol  and anesthetized a cooling spray. Then, a 10cc syringe with a 1.5 inch long 25-gauge needle was used for a joint injection. The needle was advanced  Into the right lateral hip joint cavity. I aspirated a small amount of intra-articular fluid to confirm correct placement of the needle and injected the joint with 12mL of 2% lidocaine and 20mg  of Depo-Medrol .  Band-Aid was applied.   Tolerated well. Complications: None. Good pain relief following the procedure.   Postprocedure instructions :    A Band-Aid should be left on for 12 hours. Injection therapy is not a cure itself. It is used in conjunction with other modalities. You can use nonsteroidal anti-inflammatories like ibuprofen , hot and cold compresses. Rest is recommended in the next 24 hours. You need to report immediately  if fever, chills or any signs of infection develop.   Assessment & Plan:   Amber Green was seen today for follow-up and  diarrhea.  Diagnoses and all orders for this visit:  Diarrhea, unspecified type -     Stool, WBC/Lactoferrin; Future -     Gastrointestinal Pathogen Panel PCR; Future  Trochanteric bursitis of right hip -     methylPREDNISolone acetate (DEPO-MEDROL) injection 20 mg  Pain of left heel   I am having Amber Green maintain her Melatonin and sulfamethoxazole-trimethoprim. We administered methylPREDNISolone acetate.  Meds ordered this encounter  Medications  . methylPREDNISolone acetate (DEPO-MEDROL) injection 20 mg    Follow-up: Return if symptoms worsen or fail to improve.  Wilfred Lacy, NP

## 2018-04-12 NOTE — Patient Instructions (Addendum)
Go to lab for stool collection kit.  Return stool sample to lab as soon as possible.   do heel exercise once a day  Massage heel with cold compress 2times a day (15-35mns at a time).  Have a wonderful and safe vacation.  Will refer to podiatry if heel pain does not improve.  Instructions given to rest hip joint and use cold compress for next 24hrs.  Food Choices to Help Relieve Diarrhea, Adult When you have diarrhea, the foods you eat and your eating habits are very important. Choosing the right foods and drinks can help:  Relieve diarrhea.  Replace lost fluids and nutrients.  Prevent dehydration.  What general guidelines should I follow? Relieving diarrhea  Choose foods with less than 2 g or .07 oz. of fiber per serving.  Limit fats to less than 8 tsp (38 g or 1.34 oz.) a day.  Avoid the following: ? Foods and beverages sweetened with high-fructose corn syrup, honey, or sugar alcohols such as xylitol, sorbitol, and mannitol. ? Foods that contain a lot of fat or sugar. ? Fried, greasy, or spicy foods. ? High-fiber grains, breads, and cereals. ? Raw fruits and vegetables.  Eat foods that are rich in probiotics. These foods include dairy products such as yogurt and fermented milk products. They help increase healthy bacteria in the stomach and intestines (gastrointestinal tract, or GI tract).  If you have lactose intolerance, avoid dairy products. These may make your diarrhea worse.  Take medicine to help stop diarrhea (antidiarrheal medicine) only as told by your health care provider. Replacing nutrients  Eat small meals or snacks every 3-4 hours.  Eat bland foods, such as white rice, toast, or baked potato, until your diarrhea starts to get better. Gradually reintroduce nutrient-rich foods as tolerated or as told by your health care provider. This includes: ? Well-cooked protein foods. ? Peeled, seeded, and soft-cooked fruits and vegetables. ? Low-fat dairy  products.  Take vitamin and mineral supplements as told by your health care provider. Preventing dehydration   Start by sipping water or a special solution to prevent dehydration (oral rehydration solution, ORS). Urine that is clear or pale yellow means that you are getting enough fluid.  Try to drink at least 8-10 cups of fluid each day to help replace lost fluids.  You may add other liquids in addition to water, such as clear juice or decaffeinated sports drinks, as tolerated or as told by your health care provider.  Avoid drinks with caffeine, such as coffee, tea, or soft drinks.  Avoid alcohol. What foods are recommended? The items listed may not be a complete list. Talk with your health care provider about what dietary choices are best for you. Grains White rice. White, [place] or pita breads (fresh or toasted), including plain rolls, buns, or bagels. White pasta. Saltine, soda, or graham crackers. Pretzels. Low-fiber cereal. Cooked cereals made with water (such as cornmeal, farina, or cream cereals). Plain muffins. Matzo. Melba toast. Zwieback. Vegetables Potatoes (without the skin). Most well-cooked and canned vegetables without skins or seeds. Tender lettuce. Fruits Apple sauce. Fruits canned in juice. Cooked apricots, cherries, grapefruit, peaches, pears, or plums. Fresh bananas and cantaloupe. Meats and other protein foods Baked or boiled chicken. Eggs. Tofu. Fish. Seafood. Smooth nut butters. Ground or well-cooked tender beef, ham, veal, lamb, pork, or poultry. Dairy Plain yogurt, kefir, and unsweetened liquid yogurt. Lactose-free milk, buttermilk, skim milk, or soy milk. Low-fat or nonfat hard cheese. Beverages Water. Low-calorie sports drinks. Fruit juices  without pulp. Strained tomato and vegetable juices. Decaffeinated teas. Sugar-free beverages not sweetened with sugar alcohols. Oral rehydration solutions, if approved by your health care provider. Seasoning and other  foods Bouillon, broth, or soups made from recommended foods. What foods are not recommended? The items listed may not be a complete list. Talk with your health care provider about what dietary choices are best for you. Grains Whole grain, whole wheat, bran, or rye breads, rolls, pastas, and crackers. Wild or brown rice. Whole grain or bran cereals. Barley. Oats and oatmeal. Corn tortillas or taco shells. Granola. Popcorn. Vegetables Raw vegetables. Fried vegetables. Cabbage, broccoli, Brussels sprouts, artichokes, baked beans, beet greens, corn, kale, legumes, peas, sweet potatoes, and yams. Potato skins. Cooked spinach and cabbage. Fruits Dried fruit, including raisins and dates. Raw fruits. Stewed or dried prunes. Canned fruits with syrup. Meat and other protein foods Fried or fatty meats. Deli meats. Chunky nut butters. Nuts and seeds. Beans and lentils. Berniece Salines. Hot dogs. Sausage. Dairy High-fat cheeses. Whole milk, chocolate milk, and beverages made with milk, such as milk shakes. Half-and-half. Cream. sour cream. Ice cream. Beverages Caffeinated beverages (such as coffee, tea, soda, or energy drinks). Alcoholic beverages. Fruit juices with pulp. Prune juice. Soft drinks sweetened with high-fructose corn syrup or sugar alcohols. High-calorie sports drinks. Fats and oils Butter. Cream sauces. Margarine. Salad oils. Plain salad dressings. Olives. Avocados. Mayonnaise. Sweets and desserts Sweet rolls, doughnuts, and sweet breads. Sugar-free desserts sweetened with sugar alcohols such as xylitol and sorbitol. Seasoning and other foods Honey. Hot sauce. Chili powder. Gravy. Cream-based or milk-based soups. Pancakes and waffles. Summary  When you have diarrhea, the foods you eat and your eating habits are very important.  Make sure you get at least 8-10 cups of fluid each day, or enough to keep your urine clear or pale yellow.  Eat bland foods and gradually reintroduce healthy, nutrient-rich  foods as tolerated, or as told by your health care provider.  Avoid high-fiber, fried, greasy, or spicy foods. This information is not intended to replace advice given to you by your health care provider. Make sure you discuss any questions you have with your health care provider. Document Released: 11/25/2003 Document Revised: 09/01/2016 Document Reviewed: 09/01/2016 Elsevier Interactive Patient Education  2018 [employer].   Plantar Fasciitis Rehab Ask your health care provider which exercises are safe for you. Do exercises exactly as told by your health care provider and adjust them as directed. It is normal to feel mild stretching, pulling, tightness, or discomfort as you do these exercises, but you should stop right away if you feel sudden pain or your pain gets worse. Do not begin these exercises until told by your health care provider. Stretching and range of motion exercises These exercises warm up your muscles and joints and improve the movement and flexibility of your foot. These exercises also help to relieve pain. Exercise A: Plantar fascia stretch  1. Sit with your left / right leg crossed over your opposite knee. 2. Hold your heel with one hand with that thumb near your arch. With your other hand, hold your toes and gently pull them back toward the top of your foot. You should feel a stretch on the bottom of your toes or your foot or both. 3. Hold this stretch for__________ seconds. 4. Slowly release your toes and return to the starting position. Repeat __________ times. Complete this exercise __________ times a day. Exercise B: Gastroc, standing  1. Stand with your hands against a  wall. 2. Extend your left / right leg behind you, and bend your front knee slightly. 3. Keeping your heels on the floor and keeping your back knee straight, shift your weight toward the wall without arching your back. You should feel a gentle stretch in your left / right calf. 4. Hold this position  for __________ seconds. Repeat __________ times. Complete this exercise __________ times a day. Exercise C: Soleus, standing 1. Stand with your hands against a wall. 2. Extend your left / right leg behind you, and bend your front knee slightly. 3. Keeping your heels on the floor, bend your back knee and slightly shift your weight over the back leg. You should feel a gentle stretch deep in your calf. 4. Hold this position for __________ seconds. Repeat __________ times. Complete this exercise __________ times a day. Exercise D: Gastrocsoleus, standing 1. Stand with the ball of your left / right foot on a step. The ball of your foot is on the walking surface, right under your toes. 2. Keep your other foot firmly on the same step. 3. Hold onto the wall or a railing for balance. 4. Slowly lift your other foot, allowing your body weight to press your heel down over the edge of the step. You should feel a stretch in your left / right calf. 5. Hold this position for __________ seconds. 6. Return both feet to the step. 7. Repeat this exercise with a slight bend in your left / right knee. Repeat __________ times with your left / right knee straight and __________ times with your left / right knee bent. Complete this exercise __________ times a day. Balance exercise This exercise builds your balance and strength control of your arch to help take pressure off your plantar fascia. Exercise E: Single leg stand 1. Without shoes, stand near a railing or in a doorway. You may hold onto the railing or door frame as needed. 2. Stand on your left / right foot. Keep your big toe down on the floor and try to keep your arch lifted. Do not let your foot roll inward. 3. Hold this position for __________ seconds. 4. If this exercise is too easy, you can try it with your eyes closed or while standing on a pillow. Repeat __________ times. Complete this exercise __________ times a day. This information is not intended  to replace advice given to you by your health care provider. Make sure you discuss any questions you have with your health care provider. Document Released: 09/04/2005 Document Revised: 05/09/2016 Document Reviewed: 07/19/2015 Elsevier Interactive Patient Education  2018 [employer].

## 2018-06-13 ENCOUNTER — Ambulatory Visit: Payer: BC Managed Care – PPO | Admitting: Nurse Practitioner

## 2018-06-13 ENCOUNTER — Encounter: Payer: Self-pay | Admitting: Nurse Practitioner

## 2018-06-13 VITALS — BP 92/64 | HR 94 | Temp 98.6°F | Ht 70.0 in | Wt 176.0 lb

## 2018-06-13 DIAGNOSIS — J00 Acute nasopharyngitis [common cold]: Secondary | ICD-10-CM

## 2018-06-13 MED ORDER — SALINE SPRAY 0.65 % NA SOLN: 1.0000 | NASAL | 0 refills | Status: DC | PRN

## 2018-06-13 MED ORDER — AZITHROMYCIN 250 MG PO TABS: 250.0000 mg | ORAL_TABLET | Freq: Every day | ORAL | 0 refills | Status: DC

## 2018-06-13 MED ORDER — HYDROCODONE-HOMATROPINE 5-1.5 MG/5ML PO SYRP: 5.0000 mL | ORAL_SOLUTION | Freq: Every evening | ORAL | 0 refills | Status: DC | PRN

## 2018-06-13 MED ORDER — BENZONATATE 100 MG PO CAPS: 100.0000 mg | ORAL_CAPSULE | Freq: Three times a day (TID) | ORAL | 0 refills | Status: DC | PRN

## 2018-06-13 MED ORDER — CHLORPHEN-PE-ACETAMINOPHEN 4-10-325 MG PO TABS: 1.0000 | ORAL_TABLET | Freq: Two times a day (BID) | ORAL | 0 refills | Status: AC

## 2018-06-13 MED ORDER — FLUTICASONE PROPIONATE 50 MCG/ACT NA SUSP: 2.0000 | Freq: Every day | NASAL | 0 refills | Status: DC

## 2018-06-13 MED ORDER — GUAIFENESIN ER 600 MG PO TB12: 600.0000 mg | ORAL_TABLET | Freq: Two times a day (BID) | ORAL | 0 refills | Status: DC | PRN

## 2018-06-13 NOTE — Progress Notes (Signed)
Subjective:  Patient ID: Amber Green, female    DOB: 01-08-1966  Age: 52 y.o. MRN: [id number]  CC: Sinusitis (patient is complaining of ears pain,runny nose,sore throat,congestion. going on 2 days. tried OTC. )   URI   This is a new problem. The current episode started yesterday. The problem has been unchanged. There has been no fever. Associated symptoms include congestion, coughing, ear pain, headaches, joint pain, a plugged ear sensation, rhinorrhea, sinus pain and a sore throat. Pertinent negatives include no joint swelling, nausea, neck pain, sneezing, swollen glands, vomiting or wheezing. She has tried decongestant and acetaminophen for the symptoms. The treatment provided no relief.   Reviewed past Medical, Social and Family history today.  Outpatient Medications Prior to Visit  Medication Sig Dispense Refill  . Melatonin 5 MG CAPS Take by mouth at bedtime.    . sulfamethoxazole-trimethoprim (BACTRIM DS,SEPTRA DS) 800-160 MG tablet Take 1 tablet by mouth 2 (two) times daily. (Patient not taking: Reported on 04/12/2018) 6 tablet 0   No facility-administered medications prior to visit.     ROS See HPI  Objective:  BP 92/64   Pulse 94   Temp 98.6 F (37 C) (Oral)   Ht 5\' 10"  (1.778 m)   Wt 176 lb (79.8 kg)   LMP 11/17/2016 (Approximate)   SpO2 95%   BMI 25.25 kg/m   BP Readings from Last 3 Encounters:  06/13/18 92/64  04/12/18 100/68  02/04/18 106/78    Wt Readings from Last 3 Encounters:  06/13/18 176 lb (79.8 kg)  04/12/18 176 lb (79.8 kg)  02/04/18 183 lb 9.6 oz (83.3 kg)    Physical Exam  Constitutional: She is oriented to person, place, and time. She appears well-developed and well-nourished.  HENT:  Right Ear: External ear normal.  Left Ear: External ear normal.  Nose: Mucosal edema and rhinorrhea present. Right sinus exhibits maxillary sinus tenderness and frontal sinus tenderness. Left sinus exhibits maxillary sinus tenderness and frontal sinus  tenderness.  Mouth/Throat: Uvula is midline. Posterior oropharyngeal erythema present. No oropharyngeal exudate.  Eyes: Pupils are equal, round, and reactive to light. Conjunctivae and EOM are normal.  Neck: Normal range of motion. Neck supple.  Cardiovascular: Normal rate and regular rhythm.  Pulmonary/Chest: Effort normal and breath sounds normal. She has no wheezes. She has no rales.  Neurological: She is alert and oriented to person, place, and time.  Vitals reviewed.   Lab Results  Component Value Date   WBC 7.0 10/19/2016   HGB 13.8 10/19/2016   HCT 42.6 10/19/2016   PLT 253 10/19/2016   GLUCOSE 75 10/19/2016   CHOL 170 10/19/2016   TRIG 72 10/19/2016   HDL 84 10/19/2016   LDLCALC 72 10/19/2016   ALT 15 10/19/2016   AST 15 10/19/2016   NA 142 10/19/2016   K 4.2 10/19/2016   CL 105 10/19/2016   CREATININE 1.08 (H) 10/19/2016   BUN 14 10/19/2016   CO2 24 10/19/2016   TSH 0.82 10/19/2016    Mm Screening Breast Tomo Bilateral  Result Date: 12/04/2017 CLINICAL DATA:  Screening. EXAM: DIGITAL SCREENING BILATERAL MAMMOGRAM WITH TOMO AND CAD COMPARISON:  Previous exam(s). ACR Breast Density Category c: The breast tissue is heterogeneously dense, which may obscure small masses. FINDINGS: There are no findings suspicious for malignancy. Images were processed with CAD. IMPRESSION: No mammographic evidence of malignancy. A result letter of this screening mammogram will be mailed directly to the patient. RECOMMENDATION: Screening mammogram in one year. (Code:SM-B-01Y) BI-RADS  CATEGORY  1: Negative. Electronically Signed   By: Fidela Salisbury M.D.   On: 12/04/2017 09:07    Assessment & Plan:   Amber Green was seen today for sinusitis.  Diagnoses and all orders for this visit:  Acute nasopharyngitis -     Chlorphen-PE-Acetaminophen 4-10-325 MG TABS; Take 1 tablet by mouth every 12 (twelve) hours for 3 days. -     benzonatate (TESSALON) 100 MG capsule; Take 1 capsule (100 mg  total) by mouth 3 (three) times daily as needed for cough. -     guaiFENesin (MUCINEX) 600 MG 12 hr tablet; Take 1 tablet (600 mg total) by mouth 2 (two) times daily as needed for cough or to loosen phlegm. -     sodium chloride (OCEAN) 0.65 % SOLN nasal spray; Place 1 spray into both nostrils as needed for congestion. -     fluticasone (FLONASE) 50 MCG/ACT nasal spray; Place 2 sprays into both nostrils daily. -     azithromycin (ZITHROMAX Z-PAK) 250 MG tablet; Take 1 tablet (250 mg total) by mouth daily. Take 2tabs on first day, then 1tab once a day till complete -     HYDROcodone-homatropine (HYCODAN) 5-1.5 MG/5ML syrup; Take 5 mLs by mouth at bedtime as needed for cough.   I am having Amber Green start on Chlorphen-PE-Acetaminophen, benzonatate, guaiFENesin, sodium chloride, fluticasone, azithromycin, and HYDROcodone-homatropine. I am also having her maintain her Melatonin and sulfamethoxazole-trimethoprim.  Meds ordered this encounter  Medications  . Chlorphen-PE-Acetaminophen 4-10-325 MG TABS    Sig: Take 1 tablet by mouth every 12 (twelve) hours for 3 days.    Dispense:  6 tablet    Refill:  0    Order Specific Question:   Supervising Provider    Answer:   MATTHEWS, CODY [[id number]]  . benzonatate (TESSALON) 100 MG capsule    Sig: Take 1 capsule (100 mg total) by mouth 3 (three) times daily as needed for cough.    Dispense:  20 capsule    Refill:  0    Order Specific Question:   Supervising Provider    Answer:   MATTHEWS, CODY [[id number]]  . guaiFENesin (MUCINEX) 600 MG 12 hr tablet    Sig: Take 1 tablet (600 mg total) by mouth 2 (two) times daily as needed for cough or to loosen phlegm.    Dispense:  14 tablet    Refill:  0    Order Specific Question:   Supervising Provider    Answer:   MATTHEWS, CODY [[id number]]  . sodium chloride (OCEAN) 0.65 % SOLN nasal spray    Sig: Place 1 spray into both nostrils as needed for congestion.    Dispense:  15 mL    Refill:  0    Order Specific  Question:   Supervising Provider    Answer:   MATTHEWS, CODY [[id number]]  . fluticasone (FLONASE) 50 MCG/ACT nasal spray    Sig: Place 2 sprays into both nostrils daily.    Dispense:  16 g    Refill:  0    Order Specific Question:   Supervising Provider    Answer:   MATTHEWS, CODY [[id number]]  . azithromycin (ZITHROMAX Z-PAK) 250 MG tablet    Sig: Take 1 tablet (250 mg total) by mouth daily. Take 2tabs on first day, then 1tab once a day till complete    Dispense:  6 tablet    Refill:  0    Order Specific Question:   Supervising Provider    Answer:  MATTHEWS, CODY [[id number]]  . HYDROcodone-homatropine (HYCODAN) 5-1.5 MG/5ML syrup    Sig: Take 5 mLs by mouth at bedtime as needed for cough.    Dispense:  50 mL    Refill:  0    Order Specific Question:   Supervising Provider    Answer:   MATTHEWS, CODY [[id number]]    Follow-up: Return if symptoms worsen or fail to improve.  Wilfred Lacy, NP

## 2018-06-13 NOTE — Patient Instructions (Signed)
Start azithromycin if no improvement by Saturday.  Encourage adequate oral hydration.  Use" Delsym" or" Robitussin" cough syrup varietis for cough.  You can use plain "Tylenol" or "Advi"l for fever, chills and achyness.   "Common cold" symptoms are usually triggered by a virus.  The antibiotics are usually not necessary. On average, a" viral cold" illness would take 4-7 days to resolve. Please, make an appointment if you are not better or if you're worse.   Upper Respiratory Infection, Adult Most upper respiratory infections (URIs) are caused by a virus. A URI affects the nose, throat, and upper air passages. The most common type of URI is often called "the common cold." Follow these instructions at home:  Take medicines only as told by your doctor.  Gargle warm saltwater or take cough drops to comfort your throat as told by your doctor.  Use a warm mist humidifier or inhale steam from a shower to increase air moisture. This may make it easier to breathe.  Drink enough fluid to keep your pee (urine) clear or pale yellow.  Eat soups and other clear broths.  Have a healthy diet.  Rest as needed.  Go back to work when your fever is gone or your doctor says it is okay. ? You may need to stay home longer to avoid giving your URI to others. ? You can also wear a face mask and wash your hands often to prevent spread of the virus.  Use your inhaler more if you have asthma.  Do not use any tobacco products, including cigarettes, chewing tobacco, or electronic cigarettes. If you need help quitting, ask your doctor. Contact a doctor if:  You are getting worse, not better.  Your symptoms are not helped by medicine.  You have chills.  You are getting more short of breath.  You have brown or red mucus.  You have yellow or brown discharge from your nose.  You have pain in your face, especially when you bend forward.  You have a fever.  You have puffy (swollen) neck  glands.  You have pain while swallowing.  You have white areas in the back of your throat. Get help right away if:  You have very bad or constant: ? Headache. ? Ear pain. ? Pain in your forehead, behind your eyes, and over your cheekbones (sinus pain). ? Chest pain.  You have long-lasting (chronic) lung disease and any of the following: ? Wheezing. ? Long-lasting cough. ? Coughing up blood. ? A change in your usual mucus.  You have a stiff neck.  You have changes in your: ? Vision. ? Hearing. ? Thinking. ? Mood. This information is not intended to replace advice given to you by your health care provider. Make sure you discuss any questions you have with your health care provider. Document Released: 02/21/2008 Document Revised: 05/07/2016 Document Reviewed: 12/10/2013 Elsevier Interactive Patient Education  2018 [employer].

## 2018-06-24 ENCOUNTER — Encounter

## 2018-06-24 ENCOUNTER — Ambulatory Visit: Payer: BC Managed Care – PPO | Admitting: Obstetrics & Gynecology

## 2018-07-04 ENCOUNTER — Other Ambulatory Visit: Payer: Self-pay | Admitting: Nurse Practitioner

## 2018-07-04 DIAGNOSIS — J00 Acute nasopharyngitis [common cold]: Secondary | ICD-10-CM

## 2018-07-28 IMAGING — DX DG HIP (WITH OR WITHOUT PELVIS) 3-4V BILAT
4 series · 4 of 4 positions shown · non-contrast
Comparison: None.

CLINICAL DATA: Bilateral hip pain for several weeks without known
injury.

EXAM:
DG HIP (WITH OR WITHOUT PELVIS) 3-4V BILAT

[hip ap (1 of 2)]
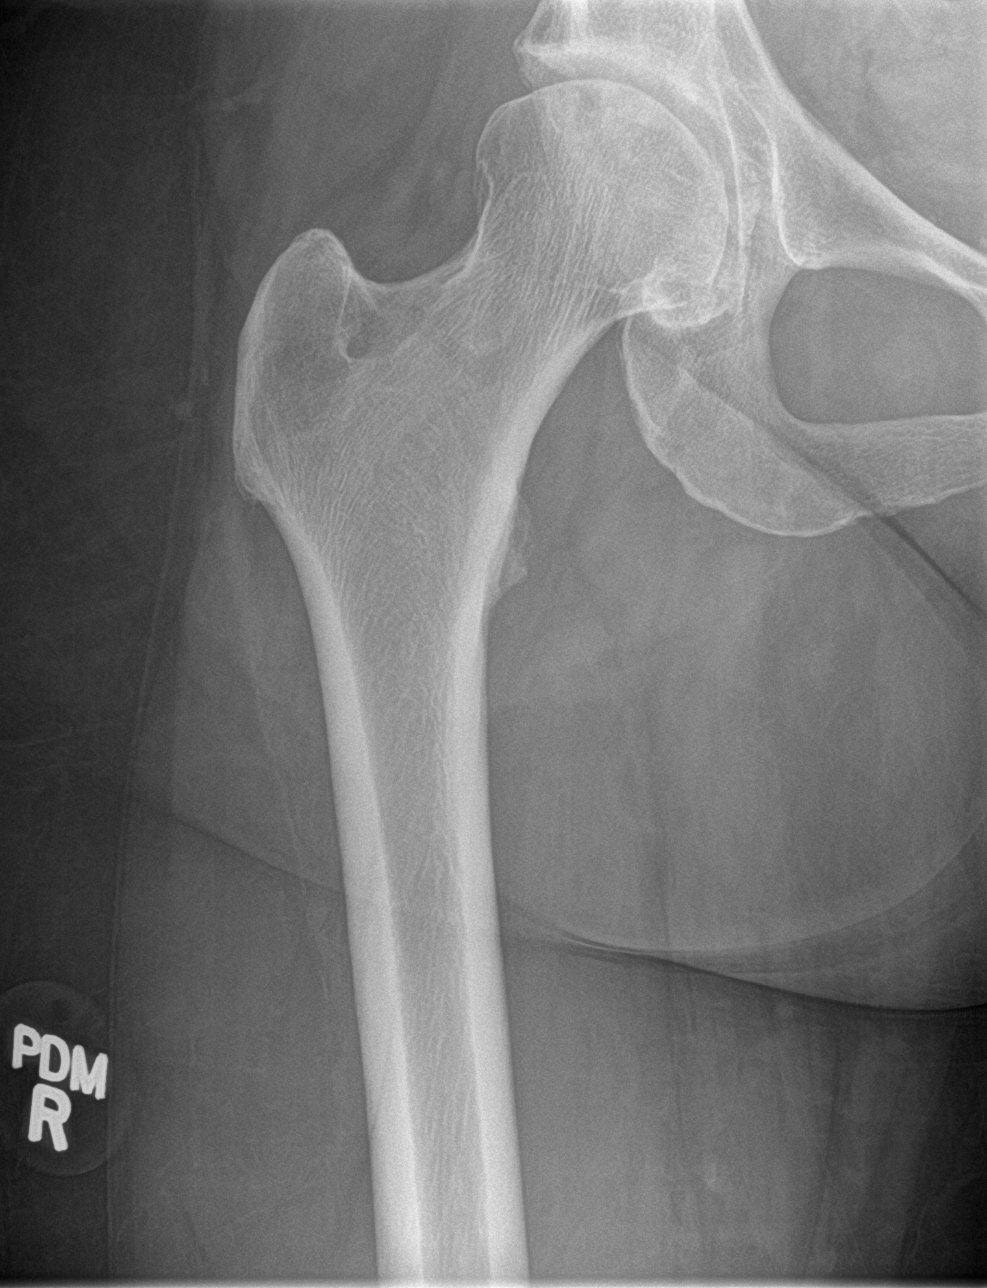

[hip lat (1 of 2)]
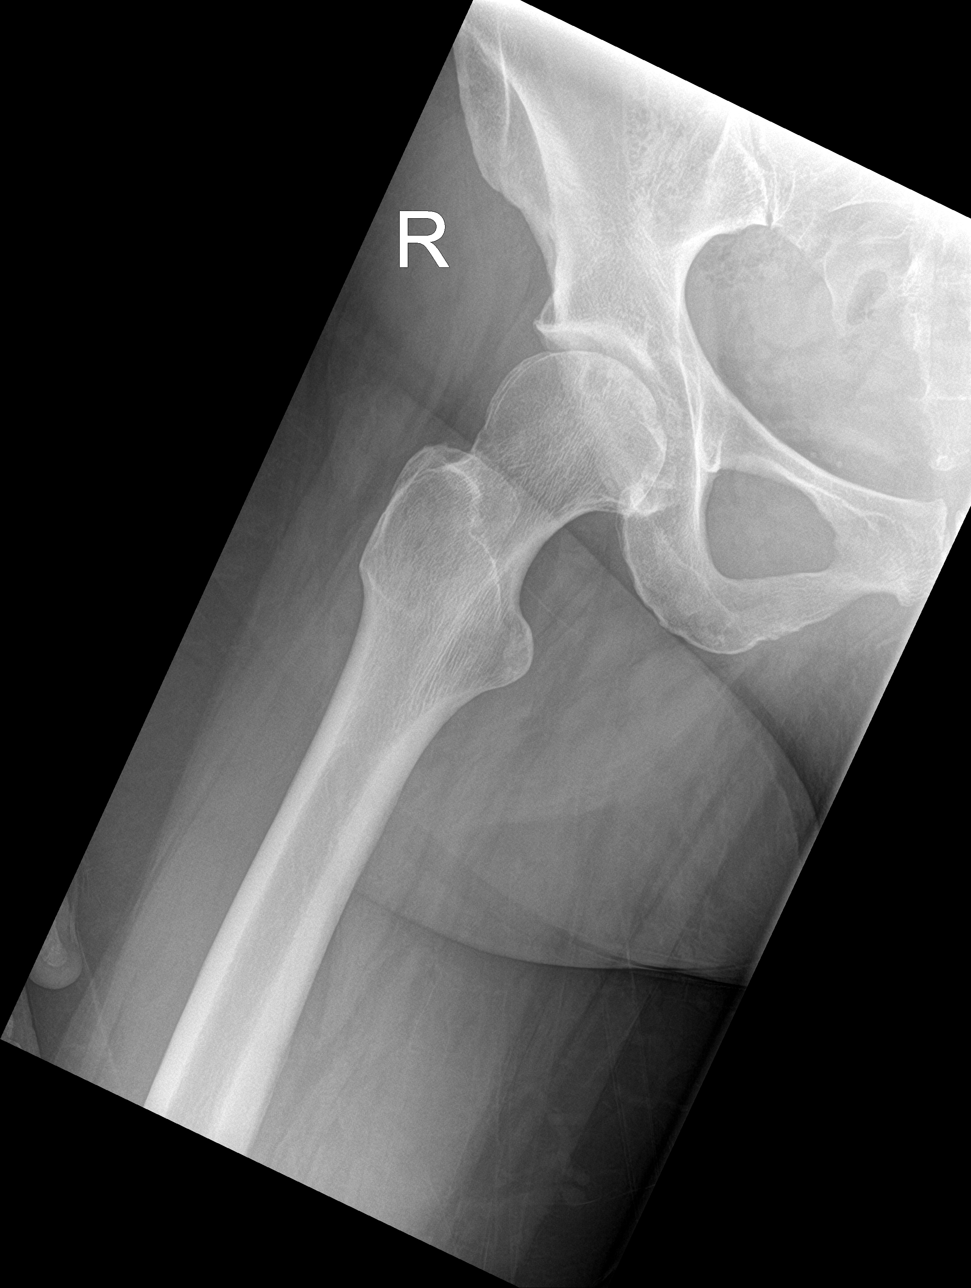

[hip ap (2 of 2)]
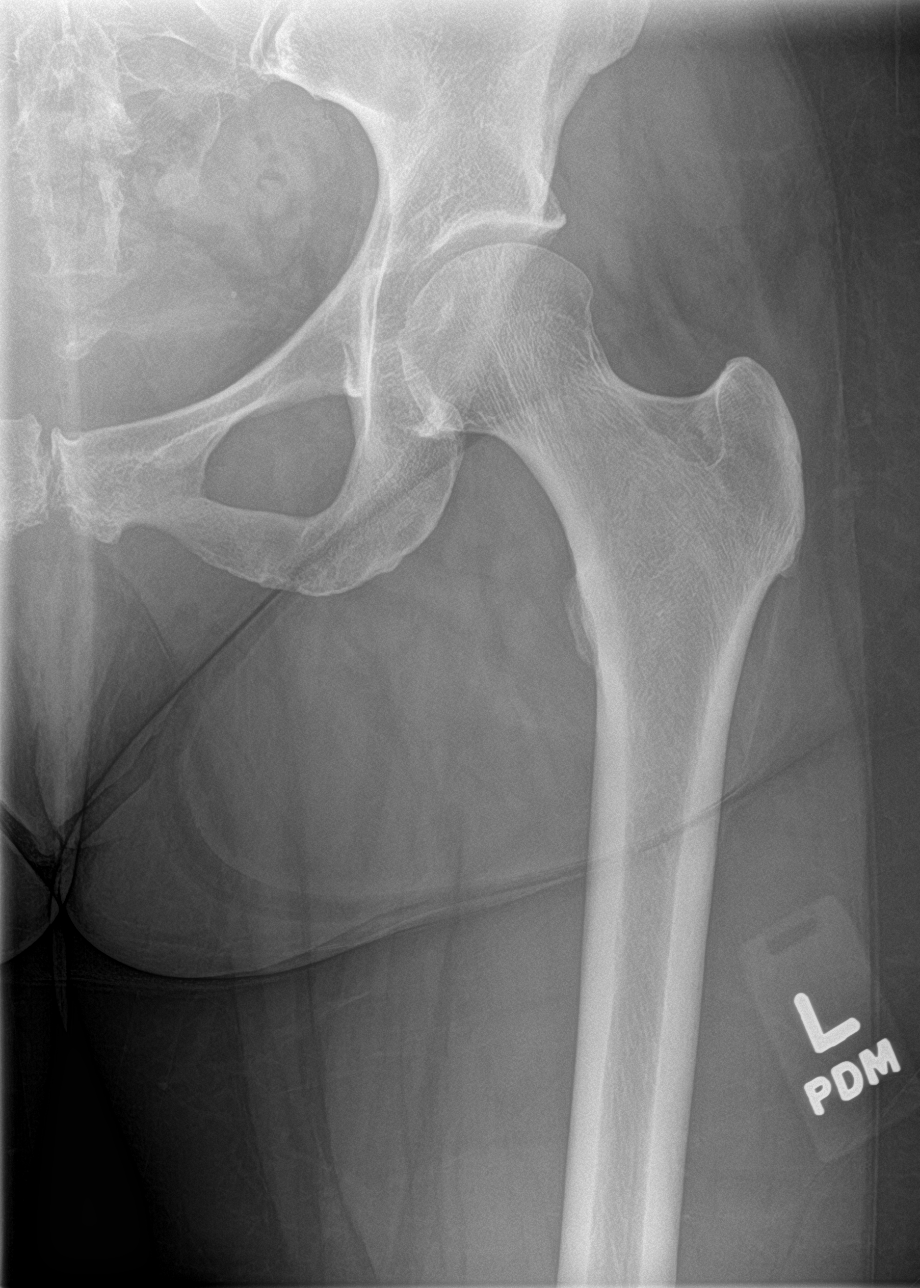

[hip lat (2 of 2)]
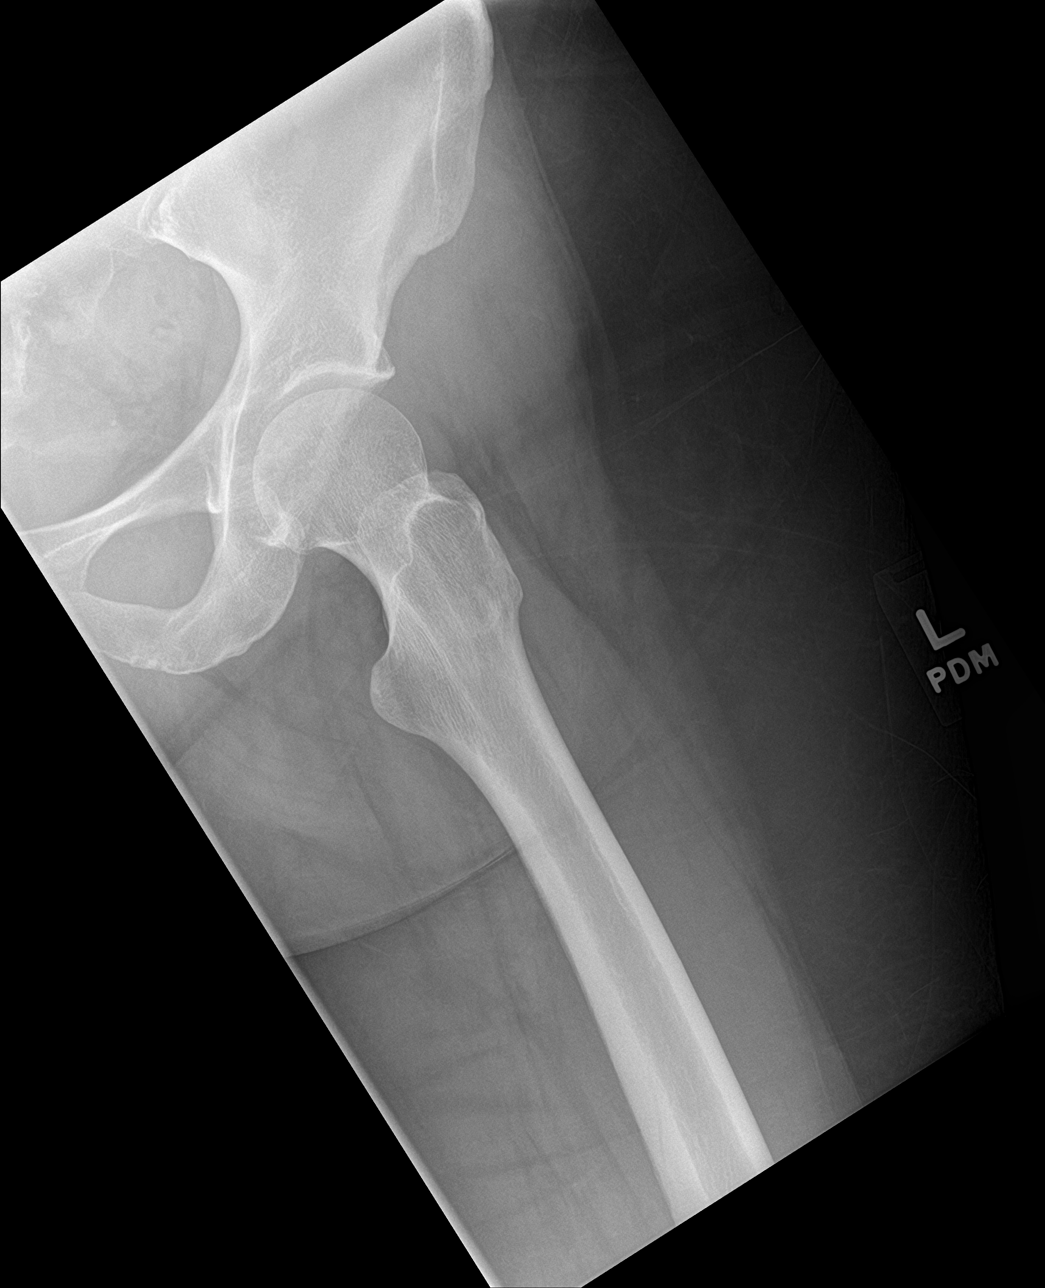

[4 of 4 positions shown; findings below may reference images not displayed]

FINDINGS: There is no evidence of hip fracture or dislocation. There is no
evidence of arthropathy in the left hip. Focal ill-defined lucency
is seen in the right femoral head which could represent avascular
necrosis or possibly subchondral cyst formation from degenerative
joint disease.
IMPRESSION: No fracture or dislocation is noted. Ill-defined lucency is noted in
right femoral head which may represent avascular necrosis or
possibly subchondral cyst from degenerative joint disease.

## 2018-08-28 ENCOUNTER — Ambulatory Visit: Payer: BC Managed Care – PPO | Admitting: Nurse Practitioner

## 2018-08-28 ENCOUNTER — Encounter: Payer: Self-pay | Admitting: Nurse Practitioner

## 2018-08-28 DIAGNOSIS — J00 Acute nasopharyngitis [common cold]: Secondary | ICD-10-CM

## 2018-08-28 MED ORDER — SALINE SPRAY 0.65 % NA SOLN: 1.0000 | NASAL | 0 refills | Status: DC | PRN

## 2018-08-28 MED ORDER — GUAIFENESIN-DM 100-10 MG/5ML PO SYRP: 5.0000 mL | ORAL_SOLUTION | ORAL | 0 refills | Status: DC | PRN

## 2018-08-28 MED ORDER — FLUTICASONE PROPIONATE 50 MCG/ACT NA SUSP: 2.0000 | Freq: Every day | NASAL | 0 refills | Status: DC

## 2018-08-28 MED ORDER — BENZONATATE 100 MG PO CAPS: 100.0000 mg | ORAL_CAPSULE | Freq: Three times a day (TID) | ORAL | 0 refills | Status: DC | PRN

## 2018-08-28 MED ORDER — AZITHROMYCIN 250 MG PO TABS: 250.0000 mg | ORAL_TABLET | Freq: Every day | ORAL | 0 refills | Status: DC

## 2018-08-28 NOTE — Progress Notes (Signed)
Subjective:  Patient ID: Amber Green, female    DOB: March 03, 1966  Age: 52 y.o. MRN: [id number]  CC: Cough (pt is c/o of coughing yellow mucus,sore throat,headache,ears pain/ 3 days/ took advil and cold & sinus/)  URI   This is a new problem. The current episode started in the past 7 days. The problem has been gradually worsening. There has been no fever. Associated symptoms include congestion, coughing, ear pain, headaches, a plugged ear sensation, rhinorrhea, sinus pain, a sore throat and swollen glands. She has tried decongestant, acetaminophen and increased fluids for the symptoms.  did not take azithromycin prescribed 05/2018.  Reviewed past Medical, Social and Family history today.  Outpatient Medications Prior to Visit  Medication Sig Dispense Refill  . Melatonin 5 MG CAPS Take by mouth at bedtime.    Marland Kitchen azithromycin (ZITHROMAX Z-PAK) 250 MG tablet Take 1 tablet (250 mg total) by mouth daily. Take 2tabs on first day, then 1tab once a day till complete (Patient not taking: Reported on 08/28/2018) 6 tablet 0  . benzonatate (TESSALON) 100 MG capsule Take 1 capsule (100 mg total) by mouth 3 (three) times daily as needed for cough. (Patient not taking: Reported on 08/28/2018) 20 capsule 0  . fluticasone (FLONASE) 50 MCG/ACT nasal spray SPRAY 2 SPRAYS INTO EACH NOSTRIL EVERY DAY (Patient not taking: Reported on 08/28/2018) 16 g 0  . guaiFENesin (MUCINEX) 600 MG 12 hr tablet Take 1 tablet (600 mg total) by mouth 2 (two) times daily as needed for cough or to loosen phlegm. (Patient not taking: Reported on 08/28/2018) 14 tablet 0  . HYDROcodone-homatropine (HYCODAN) 5-1.5 MG/5ML syrup Take 5 mLs by mouth at bedtime as needed for cough. (Patient not taking: Reported on 08/28/2018) 50 mL 0  . sodium chloride (OCEAN) 0.65 % SOLN nasal spray Place 1 spray into both nostrils as needed for congestion. (Patient not taking: Reported on 08/28/2018) 15 mL 0   No facility-administered medications prior  to visit.     ROS See HPI  Objective:  BP 110/76   Pulse 81   Temp 97.6 F (36.4 C) (Oral)   Ht 5\' 10"  (1.778 m)   Wt 191 lb 9.6 oz (86.9 kg)   LMP 11/17/2016 (Approximate)   SpO2 96%   BMI 27.49 kg/m   BP Readings from Last 3 Encounters:  08/28/18 110/76  06/13/18 92/64  04/12/18 100/68    Wt Readings from Last 3 Encounters:  08/28/18 191 lb 9.6 oz (86.9 kg)  06/13/18 176 lb (79.8 kg)  04/12/18 176 lb (79.8 kg)    Physical Exam  Constitutional: She is oriented to person, place, and time. No distress.  HENT:  Right Ear: Tympanic membrane, external ear and ear canal normal.  Left Ear: Tympanic membrane, external ear and ear canal normal.  Nose: Mucosal edema and rhinorrhea present. Right sinus exhibits maxillary sinus tenderness and frontal sinus tenderness. Left sinus exhibits maxillary sinus tenderness and frontal sinus tenderness.  Mouth/Throat: Uvula is midline. No trismus in the jaw. Posterior oropharyngeal erythema present. No oropharyngeal exudate.  Eyes: No scleral icterus.  Neck: Normal range of motion. Neck supple.  Cardiovascular: Normal rate and normal heart sounds.  Pulmonary/Chest: Effort normal and breath sounds normal.  Musculoskeletal: She exhibits no edema.  Lymphadenopathy:    She has no cervical adenopathy.  Neurological: She is alert and oriented to person, place, and time.  Vitals reviewed.   Lab Results  Component Value Date   WBC 7.0 10/19/2016   HGB  13.8 10/19/2016   HCT 42.6 10/19/2016   PLT 253 10/19/2016   GLUCOSE 75 10/19/2016   CHOL 170 10/19/2016   TRIG 72 10/19/2016   HDL 84 10/19/2016   LDLCALC 72 10/19/2016   ALT 15 10/19/2016   AST 15 10/19/2016   NA 142 10/19/2016   K 4.2 10/19/2016   CL 105 10/19/2016   CREATININE 1.08 (H) 10/19/2016   BUN 14 10/19/2016   CO2 24 10/19/2016   TSH 0.82 10/19/2016    Mm Screening Breast Tomo Bilateral  Result Date: 12/04/2017 CLINICAL DATA:  Screening. EXAM: DIGITAL SCREENING  BILATERAL MAMMOGRAM WITH TOMO AND CAD COMPARISON:  Previous exam(s). ACR Breast Density Category c: The breast tissue is heterogeneously dense, which may obscure small masses. FINDINGS: There are no findings suspicious for malignancy. Images were processed with CAD. IMPRESSION: No mammographic evidence of malignancy. A result letter of this screening mammogram will be mailed directly to the patient. RECOMMENDATION: Screening mammogram in one year. (Code:SM-B-01Y) BI-RADS CATEGORY  1: Negative. Electronically Signed   By: Fidela Salisbury M.D.   On: 12/04/2017 09:07    Assessment & Plan:   Alyssia was seen today for cough.  Diagnoses and all orders for this visit:  Acute nasopharyngitis -     sodium chloride (OCEAN) 0.65 % SOLN nasal spray; Place 1 spray into both nostrils as needed for congestion. -     fluticasone (FLONASE) 50 MCG/ACT nasal spray; Place 2 sprays into both nostrils daily. -     benzonatate (TESSALON) 100 MG capsule; Take 1 capsule (100 mg total) by mouth 3 (three) times daily as needed for cough. -     azithromycin (ZITHROMAX Z-PAK) 250 MG tablet; Take 1 tablet (250 mg total) by mouth daily. Take 2tabs on first day, then 1tab once a day till complete -     guaiFENesin-dextromethorphan (ROBITUSSIN DM) 100-10 MG/5ML syrup; Take 5 mLs by mouth every 4 (four) hours as needed for cough.   I have discontinued Anderson Malta A. Piechota's guaiFENesin and HYDROcodone-homatropine. I have also changed her fluticasone. Additionally, I am having her start on guaiFENesin-dextromethorphan. Lastly, I am having her maintain her Melatonin, sodium chloride, benzonatate, and azithromycin.  Meds ordered this encounter  Medications  . sodium chloride (OCEAN) 0.65 % SOLN nasal spray    Sig: Place 1 spray into both nostrils as needed for congestion.    Dispense:  15 mL    Refill:  0    Order Specific Question:   Supervising Provider    Answer:   Lucille Passy [[id number]]  . fluticasone (FLONASE) 50  MCG/ACT nasal spray    Sig: Place 2 sprays into both nostrils daily.    Dispense:  16 g    Refill:  0    Order Specific Question:   Supervising Provider    Answer:   Lucille Passy [[id number]]  . benzonatate (TESSALON) 100 MG capsule    Sig: Take 1 capsule (100 mg total) by mouth 3 (three) times daily as needed for cough.    Dispense:  20 capsule    Refill:  0    Order Specific Question:   Supervising Provider    Answer:   Lucille Passy [[id number]]  . azithromycin (ZITHROMAX Z-PAK) 250 MG tablet    Sig: Take 1 tablet (250 mg total) by mouth daily. Take 2tabs on first day, then 1tab once a day till complete    Dispense:  6 tablet    Refill:  0  Order Specific Question:   Supervising Provider    Answer:   Lucille Passy [[id number]]  . guaiFENesin-dextromethorphan (ROBITUSSIN DM) 100-10 MG/5ML syrup    Sig: Take 5 mLs by mouth every 4 (four) hours as needed for cough.    Dispense:  118 mL    Refill:  0    Order Specific Question:   Supervising Provider    Answer:   Lucille Passy [[id number]]    Problem List Items Addressed This Visit    None    Visit Diagnoses    Acute nasopharyngitis       Relevant Medications   sodium chloride (OCEAN) 0.65 % SOLN nasal spray   fluticasone (FLONASE) 50 MCG/ACT nasal spray   benzonatate (TESSALON) 100 MG capsule   azithromycin (ZITHROMAX Z-PAK) 250 MG tablet   guaiFENesin-dextromethorphan (ROBITUSSIN DM) 100-10 MG/5ML syrup      Follow-up: No follow-ups on file.  Wilfred Lacy, NP

## 2018-08-28 NOTE — Patient Instructions (Signed)
Upper Respiratory Infection, Adult Most upper respiratory infections (URIs) are caused by a virus. A URI affects the nose, throat, and upper air passages. The most common type of URI is often called "the common cold." Follow these instructions at home:  Take medicines only as told by your doctor.  Gargle warm saltwater or take cough drops to comfort your throat as told by your doctor.  Use a warm mist humidifier or inhale steam from a shower to increase air moisture. This may make it easier to breathe.  Drink enough fluid to keep your pee (urine) clear or pale yellow.  Eat soups and other clear broths.  Have a healthy diet.  Rest as needed.  Go back to work when your fever is gone or your doctor says it is okay. ? You may need to stay home longer to avoid giving your URI to others. ? You can also wear a face mask and wash your hands often to prevent spread of the virus.  Use your inhaler more if you have asthma.  Do not use any tobacco products, including cigarettes, chewing tobacco, or electronic cigarettes. If you need help quitting, ask your doctor. Contact a doctor if:  You are getting worse, not better.  Your symptoms are not helped by medicine.  You have chills.  You are getting more short of breath.  You have brown or red mucus.  You have yellow or brown discharge from your nose.  You have pain in your face, especially when you bend forward.  You have a fever.  You have puffy (swollen) neck glands.  You have pain while swallowing.  You have white areas in the back of your throat. Get help right away if:  You have very bad or constant: ? Headache. ? Ear pain. ? Pain in your forehead, behind your eyes, and over your cheekbones (sinus pain). ? Chest pain.  You have long-lasting (chronic) lung disease and any of the following: ? Wheezing. ? Long-lasting cough. ? Coughing up blood. ? A change in your usual mucus.  You have a stiff neck.  You have  changes in your: ? Vision. ? Hearing. ? Thinking. ? Mood. This information is not intended to replace advice given to you by your health care provider. Make sure you discuss any questions you have with your health care provider. Document Released: 02/21/2008 Document Revised: 05/07/2016 Document Reviewed: 12/10/2013 Elsevier Interactive Patient Education  2018 [employer]  

## 2018-09-09 ENCOUNTER — Encounter: Payer: Self-pay | Admitting: Nurse Practitioner

## 2018-09-09 ENCOUNTER — Ambulatory Visit: Payer: BC Managed Care – PPO | Admitting: Nurse Practitioner

## 2018-09-09 VITALS — BP 110/70 | HR 98 | Temp 97.6°F | Ht 70.0 in | Wt 192.4 lb

## 2018-09-09 DIAGNOSIS — J0141 Acute recurrent pansinusitis: Secondary | ICD-10-CM

## 2018-09-09 MED ORDER — METHYLPREDNISOLONE 4 MG PO TBPK: ORAL_TABLET | ORAL | 0 refills | Status: DC

## 2018-09-09 MED ORDER — AMOXICILLIN-POT CLAVULANATE 875-125 MG PO TABS: 1.0000 | ORAL_TABLET | Freq: Two times a day (BID) | ORAL | 0 refills | Status: DC

## 2018-09-09 MED ORDER — SACCHAROMYCES BOULARDII 250 MG PO CAPS: 250.0000 mg | ORAL_CAPSULE | Freq: Two times a day (BID) | ORAL | 0 refills | Status: DC

## 2018-09-09 NOTE — Patient Instructions (Addendum)
Stop use of oral decongestants.  May alternated benzonatate and mucinex DM or Robitussin for cough.  Continue saline sinus rinse once a day as needed.  Start medrol dose pack, Augmentin and Florastor x 1week.  Hold flonase while taking medrol dose pack.  Florators can be found over the counter.  Maintain adequate oral hydration.   Sinusitis, Adult Sinusitis is inflammation of your sinuses. Sinuses are hollow spaces in the bones around your face. Your sinuses are located:  Around your eyes.  In the middle of your forehead.  Behind your nose.  In your cheekbones. Mucus normally drains out of your sinuses. When your nasal tissues become inflamed or swollen, mucus can become trapped or blocked. This allows bacteria, viruses, and fungi to grow, which leads to infection. Most infections of the sinuses are caused by a virus. Sinusitis can develop quickly. It can last for up to 4 weeks (acute) or for more than 12 weeks (chronic). Sinusitis often develops after a cold. What are the causes? This condition is caused by anything that creates swelling in the sinuses or stops mucus from draining. This includes:  Allergies.  Asthma.  Infection from bacteria or viruses.  Deformities or blockages in your nose or sinuses.  Abnormal growths in the nose (nasal polyps).  Pollutants, such as chemicals or irritants in the air.  Infection from fungi (rare). What increases the risk? You are more likely to develop this condition if you:  Have a weak body defense system (immune system).  Do a lot of swimming or diving.  Overuse nasal sprays.  Smoke. What are the signs or symptoms? The main symptoms of this condition are pain and a feeling of pressure around the affected sinuses. Other symptoms include:  Stuffy nose or congestion.  Thick drainage from your nose.  Swelling and warmth over the affected sinuses.  Headache.  Upper toothache.  A cough that may get worse at  night.  Extra mucus that collects in the throat or the back of the nose (postnasal drip).  Decreased sense of smell and taste.  Fatigue.  A fever.  Sore throat.  Bad breath. How is this diagnosed? This condition is diagnosed based on:  Your symptoms.  Your medical history.  A physical exam.  Tests to find out if your condition is acute or chronic. This may include: ? Checking your nose for nasal polyps. ? Viewing your sinuses using a device that has a light (endoscope). ? Testing for allergies or bacteria. ? Imaging tests, such as an MRI or CT scan. In rare cases, a bone biopsy may be done to rule out more serious types of fungal sinus disease. How is this treated? Treatment for sinusitis depends on the cause and whether your condition is chronic or acute.  If caused by a virus, your symptoms should go away on their own within 10 days. You may be given medicines to relieve symptoms. They include: ? Medicines that shrink swollen nasal passages (topical intranasal decongestants). ? Medicines that treat allergies (antihistamines). ? A spray that eases inflammation of the nostrils (topical intranasal corticosteroids). ? Rinses that help get rid of thick mucus in your nose (nasal saline washes).  If caused by bacteria, your health care provider may recommend waiting to see if your symptoms improve. Most bacterial infections will get better without antibiotic medicine. You may be given antibiotics if you have: ? A severe infection. ? A weak immune system.  If caused by narrow nasal passages or nasal polyps, you may  need to have surgery. Follow these instructions at home: Medicines  Take, use, or apply over-the-counter and prescription medicines only as told by your health care provider. These may include nasal sprays.  If you were prescribed an antibiotic medicine, take it as told by your health care provider. Do not stop taking the antibiotic even if you start to feel  better. Hydrate and humidify   Drink enough fluid to keep your urine pale yellow. Staying hydrated will help to thin your mucus.  Use a cool mist humidifier to keep the humidity level in your home above 50%.  Inhale steam for 10-15 minutes, 3-4 times a day, or as told by your health care provider. You can do this in the bathroom while a hot shower is running.  Limit your exposure to cool or dry air. Rest  Rest as much as possible.  Sleep with your head raised (elevated).  Make sure you get enough sleep each night. General instructions   Apply a warm, moist washcloth to your face 3-4 times a day or as told by your health care provider. This will help with discomfort.  Wash your hands often with soap and water to reduce your exposure to germs. If soap and water are not available, use hand sanitizer.  Do not smoke. Avoid being around people who are smoking (secondhand smoke).  Keep all follow-up visits as told by your health care provider. This is important. Contact a health care provider if:  You have a fever.  Your symptoms get worse.  Your symptoms do not improve within 10 days. Get help right away if:  You have a severe headache.  You have persistent vomiting.  You have severe pain or swelling around your face or eyes.  You have vision problems.  You develop confusion.  Your neck is stiff.  You have trouble breathing. Summary  Sinusitis is soreness and inflammation of your sinuses. Sinuses are hollow spaces in the bones around your face.  This condition is caused by nasal tissues that become inflamed or swollen. The swelling traps or blocks the flow of mucus. This allows bacteria, viruses, and fungi to grow, which leads to infection.  If you were prescribed an antibiotic medicine, take it as told by your health care provider. Do not stop taking the antibiotic even if you start to feel better.  Keep all follow-up visits as told by your health care provider.  This is important. This information is not intended to replace advice given to you by your health care provider. Make sure you discuss any questions you have with your health care provider. Document Released: 09/04/2005 Document Revised: 02/04/2018 Document Reviewed: 02/04/2018 Elsevier Interactive Patient Education  2019 [employer].

## 2018-09-09 NOTE — Progress Notes (Signed)
Subjective:  Patient ID: Amber Green, female    DOB: [birth year]  Age: 52 y.o. MRN: [id number]  CC: Follow-up (pt not better from last office visit still has heachache congestion ear pain did not take benzonatate and still has ZPack from sept)   Sinusitis  This is a recurrent problem. The current episode started more than 1 month ago. The problem has been waxing and waning since onset. There has been no fever. Associated symptoms include congestion, coughing, ear pain, headaches, sinus pressure and a sore throat. Pertinent negatives include no chills, diaphoresis, hoarse voice, neck pain, shortness of breath, sneezing or swollen glands. Past treatments include acetaminophen, oral decongestants, saline sprays and antibiotics. The treatment provided mild relief.  some improvement with azithromycin, then symptoms worsen 2days ago.  Reviewed past Medical, Social and Family history today.  Outpatient Medications Prior to Visit  Medication Sig Dispense Refill  . fluticasone (FLONASE) 50 MCG/ACT nasal spray Place 2 sprays into both nostrils daily. 16 g 0  . Melatonin 5 MG CAPS Take by mouth at bedtime.    . benzonatate (TESSALON) 100 MG capsule Take 1 capsule (100 mg total) by mouth 3 (three) times daily as needed for cough. (Patient not taking: Reported on 09/09/2018) 20 capsule 0  . sodium chloride (OCEAN) 0.65 % SOLN nasal spray Place 1 spray into both nostrils as needed for congestion. (Patient not taking: Reported on 09/09/2018) 15 mL 0  . azithromycin (ZITHROMAX Z-PAK) 250 MG tablet Take 1 tablet (250 mg total) by mouth daily. Take 2tabs on first day, then 1tab once a day till complete (Patient not taking: Reported on 09/09/2018) 6 tablet 0  . guaiFENesin-dextromethorphan (ROBITUSSIN DM) 100-10 MG/5ML syrup Take 5 mLs by mouth every 4 (four) hours as needed for cough. (Patient not taking: Reported on 09/09/2018) 118 mL 0   No facility-administered medications prior to visit.      ROS See HPI  Objective:  BP 110/70   Pulse 98   Temp 97.6 F (36.4 C) (Oral)   Ht 5\' 10"  (1.778 m)   Wt 192 lb 6.4 oz (87.3 kg)   LMP 11/17/2016 (Approximate)   SpO2 94%   BMI 27.61 kg/m   BP Readings from Last 3 Encounters:  09/09/18 110/70  08/28/18 110/76  06/13/18 92/64    Wt Readings from Last 3 Encounters:  09/09/18 192 lb 6.4 oz (87.3 kg)  08/28/18 191 lb 9.6 oz (86.9 kg)  06/13/18 176 lb (79.8 kg)    Physical Exam Vitals signs reviewed.  HENT:     Nose: Nasal tenderness, mucosal edema, congestion and rhinorrhea present.     Right Turbinates: Swollen.     Left Turbinates: Swollen.     Right Sinus: Maxillary sinus tenderness and frontal sinus tenderness present.     Left Sinus: Maxillary sinus tenderness and frontal sinus tenderness present.     Mouth/Throat:     Tonsils: No tonsillar exudate. Swelling: 2+ on the right. 2+ on the left.  Neck:     Musculoskeletal: Normal range of motion and neck supple.  Cardiovascular:     Rate and Rhythm: Normal rate and regular rhythm.  Pulmonary:     Effort: Pulmonary effort is normal.     Breath sounds: Normal breath sounds.  Lymphadenopathy:     Cervical: No cervical adenopathy.  Neurological:     General: No focal deficit present.     Mental Status: She is alert and oriented to person, place, and time.  Lab Results  Component Value Date   WBC 7.0 10/19/2016   HGB 13.8 10/19/2016   HCT 42.6 10/19/2016   PLT 253 10/19/2016   GLUCOSE 75 10/19/2016   CHOL 170 10/19/2016   TRIG 72 10/19/2016   HDL 84 10/19/2016   LDLCALC 72 10/19/2016   ALT 15 10/19/2016   AST 15 10/19/2016   NA 142 10/19/2016   K 4.2 10/19/2016   CL 105 10/19/2016   CREATININE 1.08 (H) 10/19/2016   BUN 14 10/19/2016   CO2 24 10/19/2016   TSH 0.82 10/19/2016    Mm Screening Breast Tomo Bilateral  Result Date: 12/04/2017 CLINICAL DATA:  Screening. EXAM: DIGITAL SCREENING BILATERAL MAMMOGRAM WITH TOMO AND CAD COMPARISON:   Previous exam(s). ACR Breast Density Category c: The breast tissue is heterogeneously dense, which may obscure small masses. FINDINGS: There are no findings suspicious for malignancy. Images were processed with CAD. IMPRESSION: No mammographic evidence of malignancy. A result letter of this screening mammogram will be mailed directly to the patient. RECOMMENDATION: Screening mammogram in one year. (Code:SM-B-01Y) BI-RADS CATEGORY  1: Negative. Electronically Signed   By: Fidela Salisbury M.D.   On: 12/04/2017 09:07    Assessment & Plan:   Marcelline was seen today for follow-up.  Diagnoses and all orders for this visit:  Acute recurrent pansinusitis -     amoxicillin-clavulanate (AUGMENTIN) 875-125 MG tablet; Take 1 tablet by mouth 2 (two) times daily. -     methylPREDNISolone (MEDROL DOSEPAK) 4 MG TBPK tablet; Take as directed on package -     saccharomyces boulardii (FLORASTOR) 250 MG capsule; Take 1 capsule (250 mg total) by mouth 2 (two) times daily.   I have discontinued Anderson Malta A. Ahrendt's azithromycin and guaiFENesin-dextromethorphan. I am also having her start on amoxicillin-clavulanate, methylPREDNISolone, and saccharomyces boulardii. Additionally, I am having her maintain her Melatonin, sodium chloride, fluticasone, and benzonatate.  Meds ordered this encounter  Medications  . amoxicillin-clavulanate (AUGMENTIN) 875-125 MG tablet    Sig: Take 1 tablet by mouth 2 (two) times daily.    Dispense:  14 tablet    Refill:  0    Order Specific Question:   Supervising Provider    Answer:   Lucille Passy [[id number]]  . methylPREDNISolone (MEDROL DOSEPAK) 4 MG TBPK tablet    Sig: Take as directed on package    Dispense:  21 tablet    Refill:  0    Order Specific Question:   Supervising Provider    Answer:   Lucille Passy [[id number]]  . saccharomyces boulardii (FLORASTOR) 250 MG capsule    Sig: Take 1 capsule (250 mg total) by mouth 2 (two) times daily.    Dispense:  28 capsule    Refill:  0     Order Specific Question:   Supervising Provider    Answer:   Lucille Passy [[id number]]    Problem List Items Addressed This Visit    None    Visit Diagnoses    Acute recurrent pansinusitis    -  Primary   Relevant Medications   amoxicillin-clavulanate (AUGMENTIN) 875-125 MG tablet   methylPREDNISolone (MEDROL DOSEPAK) 4 MG TBPK tablet   saccharomyces boulardii (FLORASTOR) 250 MG capsule       Follow-up: No follow-ups on file.  Wilfred Lacy, NP

## 2018-10-29 ENCOUNTER — Other Ambulatory Visit: Payer: Self-pay | Admitting: Obstetrics & Gynecology

## 2018-10-29 DIAGNOSIS — Z1231 Encounter for screening mammogram for malignant neoplasm of breast: Secondary | ICD-10-CM

## 2018-12-09 ENCOUNTER — Ambulatory Visit: Payer: BC Managed Care – PPO

## 2019-01-06 ENCOUNTER — Ambulatory Visit: Payer: BC Managed Care – PPO

## 2019-01-14 ENCOUNTER — Encounter: Payer: Self-pay | Admitting: Nurse Practitioner

## 2019-01-14 ENCOUNTER — Ambulatory Visit (INDEPENDENT_AMBULATORY_CARE_PROVIDER_SITE_OTHER): Payer: BC Managed Care – PPO | Admitting: Nurse Practitioner

## 2019-01-14 ENCOUNTER — Encounter (HOSPITAL_BASED_OUTPATIENT_CLINIC_OR_DEPARTMENT_OTHER): Payer: Self-pay | Admitting: Radiology

## 2019-01-14 ENCOUNTER — Other Ambulatory Visit: Payer: Self-pay

## 2019-01-14 ENCOUNTER — Emergency Department (HOSPITAL_BASED_OUTPATIENT_CLINIC_OR_DEPARTMENT_OTHER)
Admission: EM | Admit: 2019-01-14 | Discharge: 2019-01-14 | Disposition: A | Discharge status: Home / Self Care | Payer: BC Managed Care – PPO | Attending: Emergency Medicine | Admitting: Emergency Medicine

## 2019-01-14 ENCOUNTER — Emergency Department (HOSPITAL_BASED_OUTPATIENT_CLINIC_OR_DEPARTMENT_OTHER): Payer: BC Managed Care – PPO

## 2019-01-14 ENCOUNTER — Emergency Department: Payer: BC Managed Care – PPO

## 2019-01-14 VITALS — Ht 70.0 in

## 2019-01-14 DIAGNOSIS — R0789 Other chest pain: Secondary | ICD-10-CM

## 2019-01-14 DIAGNOSIS — R7989 Other specified abnormal findings of blood chemistry: Secondary | ICD-10-CM

## 2019-01-14 DIAGNOSIS — R079 Chest pain, unspecified: Secondary | ICD-10-CM | POA: Diagnosis present

## 2019-01-14 LAB — BASIC METABOLIC PANEL
Anion gap: 8 (ref 5–15)
BUN: 16 mg/dL (ref 6–20)
CO2: 26 mmol/L (ref 22–32)
Calcium: 9.2 mg/dL (ref 8.9–10.3)
Chloride: 107 mmol/L (ref 98–111)
Creatinine, Ser: 0.88 mg/dL (ref 0.44–1.00)
GFR calc Af Amer: >60 mL/min (ref >60)
GFR calc non Af Amer: >60 mL/min (ref >60)
Glucose, Bld: 99 mg/dL (ref 70–99)
Potassium: 3.9 mmol/L (ref 3.5–5.1)
Sodium: 141 mmol/L (ref 135–145)

## 2019-01-14 LAB — CBC WITH DIFFERENTIAL/PLATELET
Abs Immature Granulocytes: 0.03 10*3/uL (ref 0.00–0.07)
Basophils Absolute: 0.1 10*3/uL (ref 0.0–0.1)
Basophils Relative: 1 %
Eosinophils Absolute: 0.2 10*3/uL (ref 0.0–0.5)
Eosinophils Relative: 2 %
HCT: 42.7 % (ref 36.0–46.0)
Hemoglobin: 13.3 g/dL (ref 12.0–15.0)
Immature Granulocytes: 0 %
Lymphocytes Relative: 23 %
Lymphs Abs: 2 10*3/uL (ref 0.7–4.0)
MCH: 28.5 pg (ref 26.0–34.0)
MCHC: 31.1 g/dL (ref 30.0–36.0)
MCV: 91.4 fL (ref 80.0–100.0)
Monocytes Absolute: 0.6 10*3/uL (ref 0.1–1.0)
Monocytes Relative: 7 %
Neutro Abs: 5.9 10*3/uL (ref 1.7–7.7)
Neutrophils Relative %: 67 %
Platelets: 235 10*3/uL (ref 150–400)
RBC: 4.67 MIL/uL (ref 3.87–5.11)
RDW: 12.2 % (ref 11.5–15.5)
WBC: 8.8 10*3/uL (ref 4.0–10.5)
nRBC: 0 % (ref 0.0–0.2)

## 2019-01-14 LAB — TROPONIN I
Troponin I: <0.01 ng/mL (ref <=0.0)
Troponin I: <0.03 ng/mL (ref <0.03)

## 2019-01-14 LAB — D-DIMER, QUANTITATIVE (NOT AT ARMC): D-Dimer, Quant: 0.62 mcg/mL FEU — ABNORMAL HIGH (ref <0.50)

## 2019-01-14 MED ORDER — ASPIRIN EC 81 MG PO TBEC: 81.0000 mg | DELAYED_RELEASE_TABLET | Freq: Every day | ORAL | 0 refills | Status: DC

## 2019-01-14 MED ORDER — IOHEXOL 350 MG/ML SOLN
100.0000 mL | Freq: Once | INTRAVENOUS | Status: AC | PRN
  Administered 2019-01-14: 100 mL via INTRAVENOUS

## 2019-01-14 MED ORDER — ALUM & MAG HYDROXIDE-SIMETH 200-200-20 MG/5ML PO SUSP
30.0000 mL | Freq: Once | ORAL | Status: AC
  Administered 2019-01-14: 30 mL via ORAL
  Filled 2019-01-14: qty 30

## 2019-01-14 MED ORDER — NITROGLYCERIN 0.4 MG SL SUBL: 0.4000 mg | SUBLINGUAL_TABLET | SUBLINGUAL | 0 refills | Status: DC | PRN

## 2019-01-14 NOTE — Discharge Instructions (Signed)
Try zantac or pepcid twice a day.  Try to avoid things that may make this worse, most commonly these are spicy foods tomato based products fatty foods chocolate and peppermint.  Alcohol and tobacco can also make this worse.  Return to the emergency department for sudden worsening pain fever or inability to eat or drink. ° °

## 2019-01-14 NOTE — ED Triage Notes (Signed)
Pt states she has been having pain in her left chest for the past 4 to 5 days that feels like something sharp pain that lasts only a few seconds then goes  Pt states 2 to 3 days ago she started having pain in her right shoulder that is a dull ache  Pt called her dr and then went in and had blood work  Pt was told her d dimer was 0.62 and was told to come to the hospital for a CT angio to rule out PE

## 2019-01-14 NOTE — Patient Instructions (Addendum)
Gave patient instruction on how to use nitroglycerin. She was also given ED precautions. She is to take 325mg  ASA today and start 81mg  tomorrow. Will check troponin and d-dimer today. Will send to hospital if positive.  Negative troponin Positive D-dimer. Advised to go to [facility] ED for further evaluation. She agreed  To go to [facility] ED.  Angina Angina is a very bad discomfort or pain in the chest, neck, or arm. Angina may cause the following symptoms in your chest:  Crushing or squeezing pain. Pain might last for more than a few minutes at a time. Or, it may stop and come back (recur) over a few minutes.  Tightness.  Fullness.  Pressure.  Heaviness. Some people also have:  Pain in the arms, neck, jaw, or back.  Heartburn or indigestion for no reason.  Shortness of breath.  An upset stomach (nausea).  Sudden cold sweats. Women and people with diabetes may have other less common symptoms such as:  Feeling tired (fatigue).  Feeling nervous or worried for no reason.  Feeling weak for no reason.  Dizziness or fainting. Follow these instructions at home: Medicines  Take over-the-counter and prescription medicines only as told by your doctor.  Do not take these medicines unless your doctor says that you can: ? NSAIDs. These include:  Ibuprofen.  Naproxen.  Celecoxib. ? Vitamin supplements that have vitamin A, vitamin E, or both. ? Hormone therapy that contains estrogen with or without progestin. Eating and drinking   Eat a heart-healthy diet that includes: ? Plenty of fresh fruits and vegetables. ? Whole grains. ? Lowfat (lean) protein. ? Lowfat dairy products.  Work with a diet and nutrition specialist (dietitian) as told by your doctor. This person can help you make healthy food choices.  Follow other instructions from your doctor about eating or drinking restrictions. Activity  Follow an exercise program that your doctor tells you.   Return to your normal activities as told by your doctor. Ask your doctor what activities are safe for you.  When you feel tired, take a break. Plan breaks if you know you are going to feel tired. Lifestyle   Do not use any products that contain nicotine or tobacco. This includes cigarettes and e-cigarettes. If you need help quitting, ask your doctor.  If your doctor says you can drink alcohol, limit your drinking to: ? 0-1 drink a day for women. ? 0-2 drinks a day for men.  Be aware of how much alcohol is in your drink. In the [place] 1 drink equals to:  12 oz of beer.  5 oz of wine.  1 oz of hard liquor. General instructions  Stay at a healthy weight. If your doctor tells you to do so, work with him or her to lose weight.  Learn to deal with stress. If you need help, ask your doctor.  Take a depression screening test to see if you are at risk for depression. If you feel depressed, talk with your doctor.  Work with your doctor to manage any other health conditions that you have. These may include diabetes or high blood pressure.  Keep your vaccines up to date. Get a flu shot every year.  Keep all follow-up visits as told by your doctor. This is important. Get help right away if:  You have pain in your chest, neck, arm, jaw, stomach, or back, and the pain: ? Lasts more than a few minutes. ? Comes back. ? Is very painful. ? Comes  more often. ? Does not get better after you take medicine under your tongue (sublingual nitroglycerin).  You have any of these problems for no reason: ? Heartburn, or indigestion. ? Sweating a lot. ? Shortness of breath. ? Trouble breathing. ? Feeling sick to your stomach. ? Throwing up. ? Feeling more tired than usual. ? Feeling nervous or worrying more than usual. ? Weakness. ? Watery poop (diarrhea).  You are suddenly dizzy or light-headed.  You pass out (faint). These symptoms may be an emergency. Do not wait to see if the symptoms  will go away. Get medical help right away. Call your local emergency services (911 in the [place] Do not drive yourself to the hospital. Summary  Angina is very bad discomfort or pain in the chest, neck, or arm.  Angina may feel like a crushing or squeezing pain in the chest. It may feel like tightness, pressure, fullness, or heaviness in the chest.  Women or people with diabetes may have different symptoms from men, such as feeling nervous, being worried, being weak for no reason, or feeling tired.  Take medicines only as told by your doctor.  You should eat a heart-healthy diet and follow an exercise program. This information is not intended to replace advice given to you by your health care provider. Make sure you discuss any questions you have with your health care provider. Document Released: 02/21/2008 Document Revised: 10/19/2017 Document Reviewed: 10/19/2017 Elsevier Interactive Patient Education  2019 [employer].

## 2019-01-14 NOTE — ED Notes (Signed)
ED Provider at bedside. 

## 2019-01-14 NOTE — ED Provider Notes (Signed)
[facility] EMERGENCY DEPARTMENT Provider Note   CSN: [id number] Arrival date & time: 01/14/19  2007    History   Chief Complaint Chief Complaint  Patient presents with   Chest Pain    HPI Amber Green is a 53 y.o. female.     53 yo F with a chief complaints of left-sided chest pain.  This is described as sharp comes and goes.  Lasts less than 5 seconds at a time.  Going on for about 4 to 5 days now.  Nothing seems to make it worse.  Nothing seems to make it come on.  She has been walking more recently but has not had any symptoms typically when she walks.  More often these occur at rest and have occurred sometimes when she tries to go down to sleep.  She denies cough or congestion denies fever.  She denies history of PE or DVT denies hemoptysis denies unilateral lower extremity edema denies history of cancer denies recent surgery or immobilization.  She denies history of MI.  Denies hypertension hyperlipidemia or diabetes or family history.  She denies history of smoking.  The patient had a virtual visit today and there was some concern for ACS versus pulmonary embolism by her provider.  She had a troponin that was done earlier that was negative but her d-dimer was positive.  She was sent here for evaluation.  The history is provided by the patient.  Chest Pain  Pain location:  L chest Pain quality: sharp and shooting   Pain radiates to:  Does not radiate Onset quality:  Gradual Duration:  5 days Timing:  Intermittent Progression:  Waxing and waning Chronicity:  New Associated symptoms: no dizziness, no fever, no headache, no nausea, no palpitations, no shortness of breath and no vomiting     Past Medical History:  Diagnosis Date   Arthritis    Clostridium difficile colitis 2003   Scoliosis     Patient Active Problem List   Diagnosis Date Noted   Lipoma of left lower extremity 11/16/2017   H/O scoliosis 01/25/2017   Back pain 01/01/2017   Grief  01/01/2017    Past Surgical History:  Procedure Laterality Date   ABDOMINAL HYSTERECTOMY  1987   APPENDECTOMY  1987   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 Authorizing Provider  aspirin EC 81 MG tablet Take 1 tablet (81 mg total) by mouth daily. 01/14/19   Nche, Charlene Brooke, NP  Melatonin 5 MG CAPS Take by mouth at bedtime.    [provider]  nitroGLYCERIN (NITROSTAT) 0.4 MG SL tablet Place 1 tablet (0.4 mg total) under the tongue every 5 (five) minutes as needed for chest pain. Maximum of 3tabs 43mins apart in 24hrs. Call 911 if no improvement 01/14/19   Nche, Charlene Brooke, NP  Family History Family History  Problem Relation Age of Onset   Diabetes Maternal Grandfather    Hypertension Maternal Grandfather    Stroke Maternal Grandfather    Lung cancer Maternal Grandmother    Breast cancer Mother 5       mastectomy   Hypothyroidism Mother    Skin cancer Brother        not melanoma   Hypertension Father    Colon cancer Neg Hx    Esophageal cancer Neg Hx    Liver cancer Neg Hx    Pancreatic cancer Neg Hx    Rectal cancer Neg Hx    Stomach cancer Neg Hx     Social History Social History   Tobacco Use   Smoking status: Never Smoker   Smokeless tobacco: Never Used  Substance Use Topics   Alcohol use: Yes    Alcohol/week: 1.0 - 2.0 standard drinks    Types: 1 - 2 Standard drinks or equivalent per week    Comment: social   Drug use: No     Allergies   Patient has no known allergies.   Review of Systems Review of Systems  Constitutional: Negative for chills and fever.  HENT: Negative for congestion  and rhinorrhea.   Eyes: Negative for redness and visual disturbance.  Respiratory: Negative for shortness of breath and wheezing.   Cardiovascular: Positive for chest pain. Negative for palpitations.  Gastrointestinal: Negative for nausea and vomiting.  Genitourinary: Negative for dysuria and urgency.  Musculoskeletal: Negative for arthralgias and myalgias.  Skin: Negative for pallor and wound.  Neurological: Negative for dizziness and headaches.     Physical Exam Updated Vital Signs BP (!) 145/90 (BP Location: Left Arm) Comment: Simultaneous filing. User may not have seen previous data.   Pulse (!) 103 Comment: Simultaneous filing. User may not have seen previous data.   Temp 98.3 F (36.8 C) (Oral)    Resp 14 Comment: Simultaneous filing. User may not have seen previous data.   Ht 5\' 10"  (1.778 m)    Wt 86.2 kg    LMP 11/17/2016 (Approximate)    SpO2 98% Comment: Simultaneous filing. User may not have seen previous data.   BMI 27.26 kg/m   Physical Exam Vitals signs and nursing note reviewed.  Constitutional:      General: She is not in acute distress.    Appearance: She is well-developed. She is not diaphoretic.  HENT:     Head: Normocephalic and atraumatic.  Eyes:     Pupils: Pupils are equal, round, and reactive to light.  Neck:     Musculoskeletal: Normal range of motion and neck supple.  Cardiovascular:     Rate and Rhythm: Normal rate and regular rhythm.     Heart sounds: No murmur. No friction rub. No gallop.   Pulmonary:     Effort: Pulmonary effort is normal.     Breath sounds: No wheezing or rales.  Chest:     Chest wall: No tenderness.     Comments: Points to left lateral chest as area of pain. No noted rash, not reproducible Abdominal:     General: There is no distension.     Palpations: Abdomen is soft.     Tenderness: There is no abdominal tenderness.  Musculoskeletal:        General: No tenderness.  Skin:    General: Skin is warm and dry.  Neurological:       Mental Status: She is alert and oriented to person, place,  and time.  Psychiatric:        Behavior: Behavior normal.      ED Treatments / Results  Labs (all labs ordered are listed, but only abnormal results are displayed) Labs Reviewed  CBC WITH DIFFERENTIAL/PLATELET  BASIC METABOLIC PANEL  TROPONIN I    EKG EKG Interpretation  Date/Time:  Tuesday January 14 2019 20:17:03 EDT Ventricular Rate:  102 PR Interval:    QRS Duration: 86 QT Interval:  338 QTC Calculation: 441 R Axis:   85 Text Interpretation:  Sinus tachycardia No old tracing to compare Confirmed by Deno Etienne [id number] on 01/14/2019 8:33:11 PM   Radiology Ct Angio Chest Pe W Or Wo Contrast  Result Date: 01/14/2019 CLINICAL DATA:  Left-sided chest pain.  Positive D-dimer. EXAM: CT ANGIOGRAPHY CHEST WITH CONTRAST TECHNIQUE: Multidetector CT imaging of the chest was performed using the standard protocol during bolus administration of intravenous contrast. Multiplanar CT image reconstructions and MIPs were obtained to evaluate the vascular anatomy. CONTRAST:  162mL OMNIPAQUE IOHEXOL 350 MG/ML SOLN COMPARISON:  None. FINDINGS: Cardiovascular: Satisfactory opacification of the pulmonary arteries to the segmental level. No evidence of pulmonary embolism. Normal heart size. No pericardial effusion. Mediastinum/Nodes: There is an oblong low-density nodule measuring 18 x 10 mm in the posterior aspect of the left lobe of the thyroid gland. The trachea and esophagus appear normal. No hilar or mediastinal adenopathy. Lungs/Pleura: Lungs are clear. No pleural effusion or pneumothorax. Upper Abdomen: There is an incompletely visualized right paraspinal mass at T12-L1. This is indeterminate. It could represent an unusual hypertrophy of the right psoas muscle. Comparison with any outside abdominal CT scans or lumbar spine CT scans may be useful. I suspect this is a chronic abnormality. 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 There is solid posterior fusion at those levels. No acute bone abnormality. Review of the MIP images confirms the above findings. IMPRESSION: 1. No evidence of pulmonary emboli or other acute abnormalities of the chest. 2. 18 x 10 mm nodule in the left lobe of the thyroid gland. Thyroid ultrasound is recommended on an elective basis for further characterization. 3. Right paraspinal mass at T12-L1 of unknown etiology. The abnormality is incompletely visualized on this chest CT scan. Comparison with any prior spine imaging or abdominal CT scans would be helpful if available. Electronically Signed   By: Lorriane Shire M.D.   On: 01/14/2019 21:30    Procedures Procedures (including critical care time)  Medications Ordered in ED Medications  alum & mag hydroxide-simeth (MAALOX/MYLANTA) 200-200-20 MG/5ML suspension 30 mL (30 mLs Oral Given 01/14/19 2043)  iohexol (OMNIPAQUE) 350 MG/ML injection 100 mL (100 mLs Intravenous Contrast Given 01/14/19 2054)     Initial Impression / Assessment and Plan / ED Course  I have reviewed the triage vital signs and the nursing notes.  Pertinent labs & imaging results that were available during my care of the patient were reviewed by me and considered in my medical decision making (see chart for details).        53 yo F with a chief complaints of left-sided chest pain.  This is atypical in nature.  Going on for about 4 5 days.  Not exertional last for less than 5 seconds at a time.  She had a d-dimer that was positive that was done as an outpatient today.  She had a troponin that was negative.  Will obtain a second troponin now basic lab work CT of the  chest for PE.  EKG with sinus tachycardia at a rate of 102 otherwise unremarkable.  The patient's CT scan of the chest is negative for pulmonary embolism or pneumonia.  Her second troponin is negative.  The patient has not had another episode of pain since she had a GI  cocktail.  I suspect that this is likely reflux.  We will have her follow-up with her family doctor.  A trial of Zantac or Pepcid.  9:48 PM:  I have discussed the diagnosis/risks/treatment options with the patient and believe the pt to be eligible for discharge home to follow-up with PCP. We also discussed returning to the ED immediately if new or worsening sx occur. We discussed the sx which are most concerning (e.g., sudden worsening pain, fever, inability to tolerate by mouth) that necessitate immediate return. Medications administered to the patient during their visit and any new prescriptions provided to the patient are listed below.  Medications given during this visit Medications  alum & mag hydroxide-simeth (MAALOX/MYLANTA) 200-200-20 MG/5ML suspension 30 mL (30 mLs Oral Given 01/14/19 2043)  iohexol (OMNIPAQUE) 350 MG/ML injection 100 mL (100 mLs Intravenous Contrast Given 01/14/19 2054)     The patient appears reasonably screen and/or stabilized for discharge and I doubt any other medical condition or other [facility] requiring further screening, evaluation, or treatment in the ED at this time prior to discharge.    Final Clinical Impressions(s) / ED Diagnoses   Final diagnoses:  Atypical chest pain    ED Discharge Orders    None       Deno Etienne, DO 01/14/19 2148

## 2019-01-14 NOTE — Progress Notes (Signed)
Virtual Visit via Video Note  I connected with Sabra Heck on 01/14/19 at  1:30 PM EDT by a video enabled telemedicine application and verified that I am speaking with the correct person using two identifiers.   I discussed the limitations of evaluation and management by telemedicine and the availability of in person appointments. The patient expressed understanding and agreed to proceed.  Provider: [facility] Patient: Home  CC: pt is c/o of sharp pain on left breast area,feels like heart area last 5 sec/dull pain in right shoulder--more muscle, 5 days/ took advil this morning and tums/ no vital sign to share today--working from home.  History of Present Illness: Chest Pain   This is a new problem. The current episode started in the past 7 days. The onset quality is sudden. The problem occurs intermittently. The problem has been waxing and waning. The pain is present in the lateral region. The quality of the pain is described as sharp and stabbing. The pain radiates to the mid back. Associated symptoms include back pain. Pertinent negatives include no abdominal pain, cough, diaphoresis, dizziness, exertional chest pressure, fever, headaches, irregular heartbeat, malaise/fatigue, nausea, near-syncope, numbness, orthopnea, palpitations, PND, shortness of breath, sputum production, syncope, vomiting or weakness. Risk factors include stress, post-menopausal and sedentary lifestyle.  Her past medical history is significant for anxiety/panic attacks.  Pertinent negatives for family medical history include: no CAD, no heart disease, no early MI, no PE, no stroke and no sudden death.  denies any chest pain during video call. No improvement with Tums and advil  Observations/Objective: Unable to provide any vital signs. Physical Exam  Constitutional: She is oriented to person, place, and time. No distress.  Pulmonary/Chest: Effort normal.  Neurological: She is alert and oriented to person, place,  and time.  Psychiatric: Her behavior is normal. Thought content normal.   Assessment and Plan: Jamyrah was seen today for pain.  Diagnoses and all orders for this visit:  Atypical chest pain -     Troponin I -     D-Dimer, Quantitative -     aspirin EC 81 MG tablet; Take 1 tablet (81 mg total) by mouth daily. -     nitroGLYCERIN (NITROSTAT) 0.4 MG SL tablet; Place 1 tablet (0.4 mg total) under the tongue every 5 (five) minutes as needed for chest pain. Maximum of 3tabs 67mins apart in 24hrs. Call 911 if no improvement  Positive D-dimer   Follow Up Instructions: She declined to go to ED/urgent care to rule out cardiac etiology of chest discomfort. Advised her about ED precautions. She is to take 325mg  of ASA today, then start 81mg  ASA tomorrow. She is to use nitroglycerin if chest pain re occurs. She is to call 911 if chest relieved by nitroglycerin and needing up to 3tabs within 24hrs. She is to go to lab for blood draw(troponin and d-dimer)  I discussed the assessment and treatment plan with the patient. The patient was provided an opportunity to ask questions and all were answered. The patient agreed with the plan and demonstrated an understanding of the instructions.   The patient was advised to call back or seek an in-person evaluation if the symptoms worsen or if the condition fails to improve as anticipated.  Wilfred Lacy, NP

## 2019-01-14 NOTE — ED Notes (Signed)
Patient transported to CT 

## 2019-01-15 ENCOUNTER — Telehealth: Payer: Self-pay | Admitting: Nurse Practitioner

## 2019-01-15 NOTE — Telephone Encounter (Signed)
Spoke with the pt, she stated she is much better today, had couple pain near heart area,woke up with the headache but took 2 ibuprofen to help. Pt was wondering if Baldo Ash can see her CT and her Blood work from the hospital? Please advise, pt stated it would be nice if she can speak with you personally.

## 2019-01-15 NOTE — Telephone Encounter (Signed)
Called but she did not answer. left voice message.  did review lab results and CT chest.  If ibuprofen is helping with discomfort, she can continue use prn If pain is persistent, then will need referral to cardiology to evaluate need for stress test and/or echocardiogram

## 2019-01-15 NOTE — Telephone Encounter (Signed)
Inquire about chest pain

## 2019-01-16 NOTE — Telephone Encounter (Signed)
Spoke with the pt, she stated she is doing better today. Pt was wondering if the hospital did D-dimmer lab? (do not see in system) and was the troponin level normal in hospital? Another questions they recommended her to get Pepcid BID but not saying dosage, she got Pepcid otc 10 mg, is this okey?

## 2019-01-16 NOTE — Telephone Encounter (Signed)
Pt is aware, she will call if she has some discomfort again.

## 2019-01-16 NOTE — Telephone Encounter (Signed)
D-dimer was done by me. With negative Ct chest, no need to repeat. Troponin was normal Ok to take pepcid [facility] 10mg  BID x 1-2weeks then stop.

## 2019-01-24 NOTE — Telephone Encounter (Signed)
Yes constipation is a possible side effect Yes decrease to once a day or as needed Ok to use fiber pil as longer as adequate oral hydration is maintained. She was to take pepcid only for 1-2weeks then stop. So she can stop if symptoms have resolved.

## 2019-01-24 NOTE — Telephone Encounter (Signed)
Pt called back stating that Pepcid complete might have cause her to have constipation so she went and get the regular Pepcid 10 mg and added fiber pill as well to help. Pt report no symptoms after taking Pepcid.   Pt was wondering:  1. regular Pepcid going to cause her constipation? 2. It is okey to decrease it down to once daily? 3. Is it okey to add fiber pill to help with constipation?  4. How does she have to keep taking Pepcid?

## 2019-01-24 NOTE — Telephone Encounter (Signed)
Pt called and stated that she has taken the pedcid for 1 week and is having side affects( constipation) pt would like a call back regarding. Please advise

## 2019-01-27 NOTE — Telephone Encounter (Signed)
Pt is aware.  

## 2019-02-18 ENCOUNTER — Ambulatory Visit
Admission: RE | Admit: 2019-02-18 | Discharge: 2019-02-18 | Disposition: A | Discharge status: Home / Self Care | Payer: BC Managed Care – PPO | Source: Ambulatory Visit | Attending: Obstetrics & Gynecology | Admitting: Obstetrics & Gynecology

## 2019-02-18 ENCOUNTER — Ambulatory Visit: Payer: BC Managed Care – PPO

## 2019-02-18 ENCOUNTER — Other Ambulatory Visit: Payer: Self-pay

## 2019-02-18 DIAGNOSIS — Z1231 Encounter for screening mammogram for malignant neoplasm of breast: Secondary | ICD-10-CM

## 2019-04-09 ENCOUNTER — Other Ambulatory Visit: Payer: Self-pay

## 2019-04-11 ENCOUNTER — Ambulatory Visit: Payer: BC Managed Care – PPO | Admitting: Obstetrics & Gynecology

## 2019-04-11 ENCOUNTER — Other Ambulatory Visit (HOSPITAL_COMMUNITY)
Admission: RE | Admit: 2019-04-11 | Discharge: 2019-04-11 | Disposition: A | Discharge status: Home / Self Care | Payer: BC Managed Care – PPO | Source: Ambulatory Visit | Attending: Obstetrics & Gynecology | Admitting: Obstetrics & Gynecology

## 2019-04-11 ENCOUNTER — Encounter: Payer: Self-pay | Admitting: Obstetrics & Gynecology

## 2019-04-11 ENCOUNTER — Other Ambulatory Visit: Payer: Self-pay

## 2019-04-11 VITALS — BP 104/70 | HR 72 | Temp 97.2°F | Ht 69.5 in | Wt 199.0 lb

## 2019-04-11 DIAGNOSIS — Z01419 Encounter for gynecological examination (general) (routine) without abnormal findings: Secondary | ICD-10-CM | POA: Diagnosis not present

## 2019-04-11 DIAGNOSIS — R319 Hematuria, unspecified: Secondary | ICD-10-CM

## 2019-04-11 DIAGNOSIS — Z0000 Encounter for general adult medical examination without abnormal findings: Secondary | ICD-10-CM | POA: Diagnosis not present

## 2019-04-11 DIAGNOSIS — Z124 Encounter for screening for malignant neoplasm of cervix: Secondary | ICD-10-CM | POA: Insufficient documentation

## 2019-04-11 LAB — POCT URINALYSIS DIPSTICK
Bilirubin, UA: NEGATIVE
Glucose, UA: NEGATIVE
Ketones, UA: NEGATIVE
Nitrite, UA: NEGATIVE
Protein, UA: POSITIVE — AB
pH, UA: 5 (ref 5.0–8.0)

## 2019-04-11 MED ORDER — SULFAMETHOXAZOLE-TRIMETHOPRIM 800-160 MG PO TABS: 1.0000 | ORAL_TABLET | Freq: Two times a day (BID) | ORAL | 0 refills | Status: DC

## 2019-04-11 NOTE — Patient Instructions (Signed)
Outpatient Pharmacy at [facility] [address] [place], [place] [address]  Main: [phone]

## 2019-04-11 NOTE — Progress Notes (Addendum)
53 y.o. G2P2 Single White or Caucasian female here for annual exam.  Doing well.  Denies vaginal bleeding.  Saw some blood in urine this morning.  Having dysuria.    Did have some atypical chest pain and went to the ER in April.  Evaluation was negative.  Started Pepcid.  Reports pain has resolved as she is being much better with eating late.  She stopped the pepcid due to constipation.  Patient's last menstrual period was 11/17/2016 (approximate).          Sexually active: No.  The current method of family planning is post menopausal status.    Exercising: No.   Smoker:  no  Health Maintenance: Pap:  10/19/16 neg. HR HPV:neg   06/01/14 neg  History of abnormal Pap:  no MMG:  02/18/19 BIRADS1:Neg  Colonoscopy:  04/06/17 f/u 10 years  BMD:   never TDaP:  2016 Pneumonia vaccine(s):  n/a Shingrix:   Discussed vaccination with pt Hep C testing: n/a Screening Labs: Here today - fasting    reports that she has never smoked. She has never used smokeless tobacco. She reports current alcohol use of about 1.0 - 2.0 standard drinks of alcohol per week. She reports that she does not use drugs.  Past Medical History:  Diagnosis Date  . Arthritis   . Clostridium difficile colitis 2003  . Scoliosis     Past Surgical History:  Procedure Laterality Date  . ABDOMINAL HYSTERECTOMY  1987  . APPENDECTOMY  1987  . 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 Ovary Removed Right   . RSO  1987   right ovaria cyst removal    Current Outpatient Medications  Medication Sig Dispense Refill  . Melatonin 5 MG CAPS Take by mouth at bedtime.    . Multiple Vitamin (MULTIVITAMIN) tablet Take 1 tablet by mouth daily.    . nitroGLYCERIN (NITROSTAT) 0.4 MG SL tablet Place 1 tablet (0.4 mg total) under the tongue every 5 (five) minutes as needed for chest pain. Maximum of 3tabs 99mins apart in 24hrs. Call 911 if no improvement (Patient not taking: Reported on 04/11/2019) 20 tablet 0   No current facility-administered medications  for this visit.     Family History  Problem Relation Age of Onset  . Diabetes Maternal Grandfather   . Hypertension Maternal Grandfather   . Stroke Maternal Grandfather   . Lung cancer Maternal Grandmother   . Breast cancer Mother 47       mastectomy  . Hypothyroidism Mother   . Skin cancer Brother        not melanoma  . Hypertension Father   . Colon cancer Neg Hx   . Esophageal cancer Neg Hx   . Liver cancer Neg Hx   . Pancreatic cancer Neg Hx   . Rectal cancer Neg Hx   . Stomach cancer Neg Hx     Review of Systems  Genitourinary: Positive for hematuria.  All other systems reviewed and are negative.   Exam:   BP 104/70   Pulse 72   Temp (!) 97.2 F (36.2 C) (Temporal)   Ht 5' 9.5" (1.765 m)   Wt 199 lb (90.3 kg)   LMP 11/17/2016 (Approximate)   BMI 28.97 kg/m  Weight:  +13#  Height: 5' 9.5" (176.5 cm)  Ht Readings from Last 3 Encounters:  04/11/19 5' 9.5" (1.765 m)  01/14/19 5\' 10"  (1.778 m)  01/14/19 5\' 10"  (1.778 m)    General appearance: alert, cooperative and appears stated age  Head: Normocephalic, without obvious abnormality, atraumatic Neck: no adenopathy, supple, symmetrical, trachea midline and thyroid normal to inspection and palpation Lungs: clear to auscultation bilaterally Breasts: normal appearance, no masses or tenderness Heart: regular rate and rhythm Abdomen: soft, non-tender; bowel sounds normal; no masses,  no organomegaly Extremities: extremities normal, atraumatic, no cyanosis or edema Skin: Skin color, texture, turgor normal. No rashes or lesions Lymph nodes: Cervical, supraclavicular, and axillary nodes normal. No abnormal inguinal nodes palpated Neurologic: Grossly normal  Pelvic: External genitalia:  no lesions              Urethra:  normal appearing urethra with no masses, tenderness or lesions              Bartholins and Skenes: normal                 Vagina: normal appearing vagina with normal color and discharge, no lesions               Cervix: no lesions              Pap taken: Yes.   Bimanual Exam:  Uterus:  normal size, contour, position, consistency, mobility, non-tender              Adnexa: normal adnexa and no mass, fullness, tenderness               Rectovaginal: Confirms               Anus:  normal sphincter tone, no lesions  Chaperone was present for exam.  A:  Well Woman with normal exam PMP, no HRT Family hx of breast cancer (mother).  Tyrer Cusick model calculation for lifetime breast cancer risk is 17.1% H/o MI in husband 2017 Hematuria   P:   Mammogram guidelines reviewed.  Limited breast MRI discussed today pap smear obtained Consider BMD between ages 62 - 75 Shingrix vaccination discussed TSH, Vit D, Lipids and HbA1C obtained today Bactrim DS Bid x 3 days.  Urine culture and micro pending. return annually or prn

## 2019-04-12 LAB — HEMOGLOBIN A1C
Est. average glucose Bld gHb Est-mCnc: 103 mg/dL
Hgb A1c MFr Bld: 5.2 % (ref 4.8–5.6)

## 2019-04-12 LAB — URINALYSIS, MICROSCOPIC ONLY
Casts: NONE SEEN /lpf
WBC, UA: >30 /hpf — AB (ref 0–5)

## 2019-04-12 LAB — LIPID PANEL
Chol/HDL Ratio: 2.3 ratio (ref 0.0–4.4)
Cholesterol, Total: 197 mg/dL (ref 100–199)
HDL: 86 mg/dL (ref >39)
LDL Calculated: 98 mg/dL (ref 0–99)
Triglycerides: 63 mg/dL (ref 0–149)
VLDL Cholesterol Cal: 13 mg/dL (ref 5–40)

## 2019-04-12 LAB — TSH: TSH: 1.21 u[IU]/mL (ref 0.450–4.500)

## 2019-04-12 LAB — VITAMIN D 25 HYDROXY (VIT D DEFICIENCY, FRACTURES): Vit D, 25-Hydroxy: 34.5 ng/mL (ref 30.0–100.0)

## 2019-04-13 LAB — URINE CULTURE

## 2019-04-14 LAB — CYTOLOGY - PAP: Diagnosis: NEGATIVE

## 2019-07-07 ENCOUNTER — Other Ambulatory Visit: Payer: Self-pay

## 2019-07-07 DIAGNOSIS — Z20822 Contact with and (suspected) exposure to covid-19: Secondary | ICD-10-CM

## 2019-07-09 LAB — NOVEL CORONAVIRUS, NAA: SARS-CoV-2, NAA: NOT DETECTED

## 2020-01-16 ENCOUNTER — Other Ambulatory Visit: Payer: Self-pay | Admitting: Obstetrics & Gynecology

## 2020-01-16 DIAGNOSIS — Z1231 Encounter for screening mammogram for malignant neoplasm of breast: Secondary | ICD-10-CM

## 2020-02-23 ENCOUNTER — Ambulatory Visit: Payer: BC Managed Care – PPO

## 2020-02-23 ENCOUNTER — Other Ambulatory Visit: Payer: Self-pay

## 2020-02-23 ENCOUNTER — Ambulatory Visit
Admission: RE | Admit: 2020-02-23 | Discharge: 2020-02-23 | Disposition: A | Discharge status: Home / Self Care | Payer: BC Managed Care – PPO | Source: Ambulatory Visit | Attending: Obstetrics & Gynecology | Admitting: Obstetrics & Gynecology

## 2020-02-23 DIAGNOSIS — Z1231 Encounter for screening mammogram for malignant neoplasm of breast: Secondary | ICD-10-CM

## 2020-03-08 ENCOUNTER — Ambulatory Visit: Payer: BC Managed Care – PPO | Admitting: Family

## 2020-03-08 ENCOUNTER — Other Ambulatory Visit: Payer: Self-pay

## 2020-03-08 ENCOUNTER — Encounter: Payer: Self-pay | Admitting: Family

## 2020-03-08 VITALS — BP 100/70 | HR 81 | Temp 97.2°F | Ht 69.5 in | Wt 200.0 lb

## 2020-03-08 DIAGNOSIS — H6121 Impacted cerumen, right ear: Secondary | ICD-10-CM | POA: Diagnosis not present

## 2020-03-08 DIAGNOSIS — S40861A Insect bite (nonvenomous) of right upper arm, initial encounter: Secondary | ICD-10-CM | POA: Diagnosis not present

## 2020-03-08 DIAGNOSIS — W57XXXA Bitten or stung by nonvenomous insect and other nonvenomous arthropods, initial encounter: Secondary | ICD-10-CM

## 2020-03-08 NOTE — Progress Notes (Signed)
Acute Office Visit  Subjective:    Patient ID: Amber Green, female    DOB: 06-Oct-1965, 54 y.o.   MRN: [id number]  Chief Complaint  Patient presents with  . Insect Bite    Pt c/o bug bite x 2 weeks ago, rt inner arm area, pt said she thought it ould be gone by now.  Pt said that it is itchy and that it was swollen when it first happened.  Pt also c/o her rt ear being clogged, x 2 months.  Pt said that it is affecting her hearing.    HPI Patient is in today with c/o feeling like her right ear is clogged x 2 months off and on. She denies any pain. Has a history of cerumen impactions in the past.   She also has concerns about a bug bite on her right arm that has been present x 2 weeks. It has improved significantly but she is concerned that it was red for so long. No pus coming from the area. No pain  Past Medical History:  Diagnosis Date  . Arthritis   . Clostridium difficile colitis 2003  . Scoliosis     Past Surgical History:  Procedure Laterality Date  . ABDOMINAL HYSTERECTOMY  1987  . APPENDECTOMY  1987  . 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 Ovary Removed Right   . RSO  1987   right ovaria cyst removal    Family History  Problem Relation Age of Onset  . Diabetes Maternal Grandfather   . Hypertension Maternal Grandfather   . Stroke Maternal Grandfather   . Lung cancer Maternal Grandmother   . Breast cancer Mother 48       mastectomy  . Hypothyroidism Mother   . Skin cancer Brother        not melanoma  . Hypertension Father   . Colon cancer Neg Hx   . Esophageal cancer Neg Hx   . Liver cancer Neg Hx   . Pancreatic cancer Neg Hx   . Rectal cancer Neg Hx   . Stomach cancer Neg Hx     Social History   Socioeconomic History  . Marital status: Single    Spouse name: Not on file  . Number of children: Not on file  . Years of education: Not on file  . Highest education level: Not on file  Occupational History  . Not on file  Tobacco Use  . Smoking status:  Never Smoker  . Smokeless tobacco: Never Used  Vaping Use  . Vaping Use: Never used  Substance and Sexual Activity  . Alcohol use: Yes    Alcohol/week: 1.0 - 2.0 standard drink    Types: 1 - 2 Standard drinks or equivalent per week  . Drug use: No  . Sexual activity: Not Currently    Partners: Male    Birth control/protection: Post-menopausal  Other Topics Concern  . Not on file  Social History Narrative  . Not on file   Social Determinants of Health   Financial Resource Strain:   . Difficulty of Paying Living Expenses:   Food Insecurity:   . Worried About Charity fundraiser in the Last Year:   . Arboriculturist in the Last Year:   Transportation Needs:   . Film/video editor (Medical):   Marland Kitchen Lack of Transportation (Non-Medical):   Physical Activity:   . Days of Exercise per Week:   . Minutes of Exercise per Session:   Stress:   .  Feeling of Stress :   Social Connections:   . Frequency of Communication with Friends and Family:   . Frequency of Social Gatherings with Friends and Family:   . Attends Religious Services:   . Active Member of Clubs or Organizations:   . Attends Archivist Meetings:   Marland Kitchen Marital Status:   Intimate Partner Violence:   . Fear of Current or Ex-Partner:   . Emotionally Abused:   Marland Kitchen Physically Abused:   . Sexually Abused:     Outpatient Medications Prior to Visit  Medication Sig Dispense Refill  . Melatonin 5 MG CAPS Take by mouth at bedtime.    . Multiple Vitamin (MULTIVITAMIN) tablet Take 1 tablet by mouth daily.    . nitroGLYCERIN (NITROSTAT) 0.4 MG SL tablet Place 1 tablet (0.4 mg total) under the tongue every 5 (five) minutes as needed for chest pain. Maximum of 3tabs 36mns apart in 24hrs. Call 911 if no improvement (Patient not taking: Reported on 04/11/2019) 20 tablet 0  . sulfamethoxazole-trimethoprim (BACTRIM DS) 800-160 MG tablet Take 1 tablet by mouth 2 (two) times daily. (Patient not taking: Reported on 03/08/2020) 6  tablet 0   No facility-administered medications prior to visit.    No Known Allergies  Review of Systems  HENT: Negative for ear discharge and ear pain.        Right ear feels clogged  Respiratory: Negative.   Cardiovascular: Negative.   Skin:       Red bug bite on the right lower arm  All other systems reviewed and are negative.      Objective:    Physical Exam Constitutional:      Appearance: Normal appearance. She is normal weight.  HENT:     Right Ear: Tympanic membrane normal.     Left Ear: Tympanic membrane normal.     Ears:     Comments: Right ear, impacted with cerumen.  Ear lavage performed.  Informed consent was obtained and peroxide gel was inserted into the ears bilaterally using the lavage kit the ears were lavaged until clean.Inspection with a cerumen spoon removed residual wax. Patient tolerated the procedure well.    Nose: Nose normal.  Cardiovascular:     Rate and Rhythm: Normal rate and regular rhythm.  Pulmonary:     Effort: Pulmonary effort is normal.     Breath sounds: Normal breath sounds.  Skin:      Neurological:     Mental Status: She is alert.     BP 100/70 (BP Location: Right Arm, Patient Position: Sitting, Cuff Size: Normal)   Pulse 81   Temp (!) 97.2 F (36.2 C) (Temporal)   Ht 5' 9.5" (1.765 m)   Wt 200 lb (90.7 kg)   LMP 11/17/2016 (Approximate)   SpO2 98%   BMI 29.11 kg/m  Wt Readings from Last 3 Encounters:  03/08/20 200 lb (90.7 kg)  04/11/19 199 lb (90.3 kg)  01/14/19 190 lb (86.2 kg)    Health Maintenance Due  Topic Date Due  . Hepatitis C Screening  Never done  . HIV Screening  Never done    There are no preventive care reminders to display for this patient.   Lab Results  Component Value Date   TSH 1.210 04/11/2019   Lab Results  Component Value Date   WBC 8.8 01/14/2019   HGB 13.3 01/14/2019   HCT 42.7 01/14/2019   MCV 91.4 01/14/2019   PLT 235 01/14/2019   Lab Results  Component Value  Date   NA  141 01/14/2019   K 3.9 01/14/2019   CO2 26 01/14/2019   GLUCOSE 99 01/14/2019   BUN 16 01/14/2019   CREATININE 0.88 01/14/2019   BILITOT 0.6 10/19/2016   ALKPHOS 54 10/19/2016   AST 15 10/19/2016   ALT 15 10/19/2016   PROT 6.4 10/19/2016   ALBUMIN 4.3 10/19/2016   CALCIUM 9.2 01/14/2019   ANIONGAP 8 01/14/2019   Lab Results  Component Value Date   CHOL 197 04/11/2019   Lab Results  Component Value Date   HDL 86 04/11/2019   Lab Results  Component Value Date   LDLCALC 98 04/11/2019   Lab Results  Component Value Date   TRIG 63 04/11/2019   Lab Results  Component Value Date   CHOLHDL 2.3 04/11/2019   Lab Results  Component Value Date   HGBA1C 5.2 04/11/2019       Assessment & Plan:   Problem List Items Addressed This Visit    None    Visit Diagnoses    Impacted cerumen of right ear    -  Primary   Insect bite of right upper extremity, initial encounter           No orders of the defined types were placed in this encounter.    Kennyth Arnold, FNP

## 2020-03-08 NOTE — Patient Instructions (Signed)
Earwax Buildup, Adult The ears produce a substance called earwax that helps keep bacteria out of the ear and protects the skin in the ear canal. Occasionally, earwax can build up in the ear and cause discomfort or hearing loss. What increases the risk? This condition is more likely to develop in people who:  Are female.  Are elderly.  Naturally produce more earwax.  Clean their ears often with cotton swabs.  Use earplugs often.  Use in-ear headphones often.  Wear hearing aids.  Have narrow ear canals.  Have earwax that is overly thick or sticky.  Have eczema.  Are dehydrated.  Have excess hair in the ear canal. What are the signs or symptoms? Symptoms of this condition include:  Reduced or muffled hearing.  A feeling of fullness in the ear or feeling that the ear is plugged.  Fluid coming from the ear.  Ear pain.  Ear itch.  Ringing in the ear.  Coughing.  An obvious piece of earwax that can be seen inside the ear canal. How is this diagnosed? This condition may be diagnosed based on:  Your symptoms.  Your medical history.  An ear exam. During the exam, your health care provider will look into your ear with an instrument called an otoscope. You may have tests, including a hearing test. How is this treated? This condition may be treated by:  Using ear drops to soften the earwax.  Having the earwax removed by a health care provider. The health care provider may: ? Flush the ear with water. ? Use an instrument that has a loop on the end (curette). ? Use a suction device.  Surgery to remove the wax buildup. This may be done in severe cases. Follow these instructions at home:   Take over-the-counter and prescription medicines only as told by your health care provider.  Do not put any objects, including cotton swabs, into your ear. You can clean the opening of your ear canal with a washcloth or facial tissue.  Follow instructions from your health care  provider about cleaning your ears. Do not over-clean your ears.  Drink enough fluid to keep your urine clear or pale yellow. This will help to thin the earwax.  Keep all follow-up visits as told by your health care provider. If earwax builds up in your ears often or if you use hearing aids, consider seeing your health care provider for routine, preventive ear cleanings. Ask your health care provider how often you should schedule your cleanings.  If you have hearing aids, clean them according to instructions from the manufacturer and your health care provider. Contact a health care provider if:  You have ear pain.  You develop a fever.  You have blood, pus, or other fluid coming from your ear.  You have hearing loss.  You have ringing in your ears that does not go away.  Your symptoms do not improve with treatment.  You feel like the room is spinning (vertigo). Summary  Earwax can build up in the ear and cause discomfort or hearing loss.  The most common symptoms of this condition include reduced or muffled hearing and a feeling of fullness in the ear or feeling that the ear is plugged.  This condition may be diagnosed based on your symptoms, your medical history, and an ear exam.  This condition may be treated by using ear drops to soften the earwax or by having the earwax removed by a health care provider.  Do not put any   objects, including cotton swabs, into your ear. You can clean the opening of your ear canal with a washcloth or facial tissue. This information is not intended to replace advice given to you by your health care provider. Make sure you discuss any questions you have with your health care provider. Document Revised: 08/17/2017 Document Reviewed: 11/15/2016 Elsevier Patient Education  2020 Elsevier Inc.  

## 2020-05-10 ENCOUNTER — Other Ambulatory Visit: Payer: Self-pay

## 2020-05-10 ENCOUNTER — Ambulatory Visit: Payer: BC Managed Care – PPO | Admitting: Nurse Practitioner

## 2020-05-10 ENCOUNTER — Encounter: Payer: Self-pay | Admitting: Nurse Practitioner

## 2020-05-10 VITALS — BP 120/80 | HR 74 | Temp 96.9°F | Ht 69.5 in | Wt 198.0 lb

## 2020-05-10 DIAGNOSIS — M25442 Effusion, left hand: Secondary | ICD-10-CM | POA: Diagnosis not present

## 2020-05-10 DIAGNOSIS — T63481A Toxic effect of venom of other arthropod, accidental (unintentional), initial encounter: Secondary | ICD-10-CM | POA: Diagnosis not present

## 2020-05-10 MED ORDER — DIPHENHYDRAMINE HCL 25 MG PO TABS: 25.0000 mg | ORAL_TABLET | Freq: Three times a day (TID) | ORAL | 0 refills | Status: DC | PRN

## 2020-05-10 MED ORDER — METHYLPREDNISOLONE ACETATE 40 MG/ML IJ SUSP: 40.0000 mg | Freq: Once | INTRAMUSCULAR | Status: DC

## 2020-05-10 MED ORDER — CEPHALEXIN 500 MG PO CAPS: 500.0000 mg | ORAL_CAPSULE | Freq: Two times a day (BID) | ORAL | 0 refills | Status: DC

## 2020-05-10 MED ORDER — METHYLPREDNISOLONE ACETATE 80 MG/ML IJ SUSP
80.0000 mg | Freq: Once | INTRAMUSCULAR | Status: AC
  Administered 2020-05-10: 40 mg via INTRAMUSCULAR

## 2020-05-10 NOTE — Progress Notes (Signed)
Subjective:  Patient ID: Amber Green, female    DOB: 12-Jul-1966  Age: 54 y.o. MRN: [id number]  CC: Follow-up (insect bite yesterday on left middle finger, swollen, burning, and itching, unable to bend)  Rash This is a new problem. The current episode started yesterday. The problem is unchanged. The affected locations include the left fingers (left middle finger). The rash is characterized by burning, redness, swelling, pain and itchiness. She was exposed to an insect bite/sting. Associated symptoms include joint pain. Pertinent negatives include no congestion, cough, facial edema, fatigue, fever or shortness of breath. Past treatments include anti-itch cream. The treatment provided no relief.    Reviewed past Medical, Social and Family history today.  Outpatient Medications Prior to Visit  Medication Sig Dispense Refill  . Melatonin 5 MG CAPS Take by mouth at bedtime.    . Multiple Vitamin (MULTIVITAMIN) tablet Take 1 tablet by mouth daily.    . nitroGLYCERIN (NITROSTAT) 0.4 MG SL tablet Place 1 tablet (0.4 mg total) under the tongue every 5 (five) minutes as needed for chest pain. Maximum of 3tabs 48mins apart in 24hrs. Call 911 if no improvement (Patient not taking: Reported on 04/11/2019) 20 tablet 0  . sulfamethoxazole-trimethoprim (BACTRIM DS) 800-160 MG tablet Take 1 tablet by mouth 2 (two) times daily. (Patient not taking: Reported on 03/08/2020) 6 tablet 0   No facility-administered medications prior to visit.    ROS See HPI  Objective:  BP 120/80 (BP Location: Left Arm, Patient Position: Sitting, Cuff Size: Normal)   Pulse 74   Temp (!) 96.9 F (36.1 C) (Temporal)   Ht 5' 9.5" (1.765 m)   Wt 198 lb (89.8 kg)   LMP 11/17/2016 (Approximate)   SpO2 97%   BMI 28.82 kg/m   Physical Exam Pulmonary:     Effort: Pulmonary effort is normal.  Musculoskeletal:     Left wrist: Normal.     Left hand: Swelling and tenderness present. No lacerations. Decreased range of  motion. Normal capillary refill. Normal pulse.     Comments: Finger finger swelling, erythema, unable to flex finger, rapid cap refill, skin intact.  Neurological:     Mental Status: She is alert.     Assessment & Plan:  This visit occurred during the SARS-CoV-2 public health emergency.  Safety protocols were in place, including screening questions prior to the visit, additional usage of staff PPE, and extensive cleaning of exam room while observing appropriate contact time as indicated for disinfecting solutions.   Earleen was seen today for follow-up.  Diagnoses and all orders for this visit:  Allergic reaction to insect sting, accidental or unintentional, initial encounter -     Discontinue: methylPREDNISolone acetate (DEPO-MEDROL) injection 40 mg -     cephALEXin (KEFLEX) 500 MG capsule; Take 1 capsule (500 mg total) by mouth 2 (two) times daily. -     diphenhydrAMINE (BENADRYL ALLERGY) 25 MG tablet; Take 1 tablet (25 mg total) by mouth every 8 (eight) hours as needed for itching. -     methylPREDNISolone acetate (DEPO-MEDROL) injection 80 mg  Swelling of finger joint of left hand -     Discontinue: methylPREDNISolone acetate (DEPO-MEDROL) injection 40 mg -     cephALEXin (KEFLEX) 500 MG capsule; Take 1 capsule (500 mg total) by mouth 2 (two) times daily. -     diphenhydrAMINE (BENADRYL ALLERGY) 25 MG tablet; Take 1 tablet (25 mg total) by mouth every 8 (eight) hours as needed for itching. -  methylPREDNISolone acetate (DEPO-MEDROL) injection 80 mg    Problem List Items Addressed This Visit    None    Visit Diagnoses    Allergic reaction to insect sting, accidental or unintentional, initial encounter    -  Primary   Relevant Medications   cephALEXin (KEFLEX) 500 MG capsule   diphenhydrAMINE (BENADRYL ALLERGY) 25 MG tablet   methylPREDNISolone acetate (DEPO-MEDROL) injection 80 mg (Completed)   Swelling of finger joint of left hand       Relevant Medications   cephALEXin  (KEFLEX) 500 MG capsule   diphenhydrAMINE (BENADRYL ALLERGY) 25 MG tablet   methylPREDNISolone acetate (DEPO-MEDROL) injection 80 mg (Completed)      Follow-up: No follow-ups on file.  Wilfred Lacy, NP

## 2020-05-10 NOTE — Patient Instructions (Signed)
You are up to date with TDAP vaccine  Start keflex if no improvement in 24hrs.  Elevated hand above shoulder as much as possible   Insect Bite, Adult An insect bite can make your skin red, itchy, and swollen. Some insects can spread disease to people with a bite. However, most insect bites do not lead to disease, and most are not serious. What are the causes? Insects may bite for many reasons, including:  Hunger.  To defend themselves. Insects that bite include:  Spiders.  Mosquitoes.  Ticks.  Fleas.  Ants.  Flies.  Kissing bugs.  Chiggers. What are the signs or symptoms? Symptoms of this condition include:  Itching or pain in the bite area.  Redness and swelling in the bite area.  An open wound (skin ulcer). Symptoms often last for 2-4 days. In rare cases, a person may have a very bad allergic reaction (anaphylactic reaction) to a bite. Symptoms of an anaphylactic reaction may include:  Feeling warm in the face (flushed). Your face may turn red.  Itchy, red, swollen areas of skin (hives).  Swelling of the: ? Eyes. ? Lips. ? Face. ? Mouth. ? Tongue. ? Throat.  Trouble with any of these: ? Breathing. ? Talking. ? Swallowing.  Loud breathing (wheezing).  Feeling dizzy or light-headed.  Passing out (fainting).  Pain or cramps in your belly.  Throwing up (vomiting).  Watery poop (diarrhea). How is this treated? Treatment is usually not needed. Symptoms often go away on their own. When treatment is needed, it may involve:  Putting a cream or lotion on the bite area. This helps with itching.  Taking an antibiotic medicine. This treatment is needed if the bite area gets infected.  Getting a tetanus shot, if you are not up to date on this vaccine.  Putting ice on the affected area.  Using medicines called antihistamines. This treatment may be needed if you have itching or an allergic reaction to the insect bite.  Giving yourself a shot of  medicine (epinephrine) using an auto-injector "pen" if you have an anaphylactic reaction to a bite. Your doctor will teach you how to use this pen. Follow these instructions at home: Bite area care   Do not scratch the bite area.  Keep the bite area clean and dry.  Wash the bite area every day with soap and water as told by your doctor.  Check the bite area every day for signs of infection. Check for: ? Redness, swelling, or pain. ? Fluid or blood. ? Warmth. ? Pus or a bad smell. Managing pain, itching, and swelling   You may put any of these on the bite area as told by your doctor: ? A paste made of baking soda and water. ? Cortisone cream. ? Calamine lotion.  If told, put ice on the bite area. ? Put ice in a plastic bag. ? Place a towel between your skin and the bag. ? Leave the ice on for 20 minutes, 2-3 times a day. General instructions  Apply or take over-the-counter and prescription medicines only as told by your doctor.  If you were prescribed an antibiotic medicine, take or apply it as told by your doctor. Do not stop using the antibiotic even if your condition improves.  Keep all follow-up visits as told by your doctor. This is important. How is this prevented? To help you have a lower risk of insect bites:  When you are outside, wear clothing that covers your arms and legs.  Use insect repellent. The best insect repellents contain one of these: ? DEET. ? Picaridin. ? Oil of lemon eucalyptus (OLE). ? IR3535.  Consider spraying your clothing with a pesticide called permethrin. Permethrin helps prevent insect bites. It works for several weeks and for up to 5-6 clothing washes. Do not apply permethrin directly to the skin.  If your home windows do not have screens, think about putting some in.  If you will be sleeping in an area where there are mosquitoes, consider covering your sleeping area with a mosquito net. Contact a doctor if:  You have redness,  swelling, or pain in the bite area.  You have fluid or blood coming from the bite area.  The bite area feels warm to the touch.  You have pus or a bad smell coming from the bite area.  You have a fever. Get help right away if:  You have joint pain.  You have a rash.  You feel more tired or sleepy than you normally do.  You have neck pain.  You have a headache.  You feel weaker than you normally do.  You have signs of an anaphylactic reaction. Signs may include: ? Feeling warm in the face. ? Itchy, red, swollen areas of skin. ? Swelling of your:  Eyes.  Lips.  Face.  Mouth.  Tongue.  Throat. ? Trouble with any of these:  Breathing.  Talking.  Swallowing. ? Loud breathing. ? Feeling dizzy or light-headed. ? Passing out. ? Pain or cramps in your belly. ? Throwing up. ? Watery poop. These symptoms may be an emergency. Do not wait to see if the symptoms will go away. Do this right away:  Use your auto-injector pen as you have been told.  Get medical help. Call your local emergency services (911 in the [place] Do not drive yourself to the hospital. Summary  An insect bite can make your skin red, itchy, and swollen.  Treatment is usually not needed. Symptoms often go away on their own.  Do not scratch the bite area. Keep it clean and dry.  Ice can help with pain and itching from the bite. This information is not intended to replace advice given to you by your health care provider. Make sure you discuss any questions you have with your health care provider. Document Revised: 03/15/2018 Document Reviewed: 03/15/2018 Elsevier Patient Education  [place].

## 2020-05-11 ENCOUNTER — Encounter: Payer: Self-pay | Admitting: Nurse Practitioner

## 2020-05-11 ENCOUNTER — Other Ambulatory Visit: Payer: Self-pay

## 2020-05-11 ENCOUNTER — Ambulatory Visit (INDEPENDENT_AMBULATORY_CARE_PROVIDER_SITE_OTHER): Payer: BC Managed Care – PPO

## 2020-05-11 ENCOUNTER — Ambulatory Visit: Payer: BC Managed Care – PPO | Admitting: Nurse Practitioner

## 2020-05-11 VITALS — BP 112/64 | HR 80 | Temp 97.4°F | Ht 69.5 in | Wt 198.0 lb

## 2020-05-11 DIAGNOSIS — T63481A Toxic effect of venom of other arthropod, accidental (unintentional), initial encounter: Secondary | ICD-10-CM | POA: Diagnosis not present

## 2020-05-11 DIAGNOSIS — M25442 Effusion, left hand: Secondary | ICD-10-CM

## 2020-05-11 MED ORDER — PREDNISONE 20 MG PO TABS: 40.0000 mg | ORAL_TABLET | Freq: Every day | ORAL | 0 refills | Status: DC

## 2020-05-11 MED ORDER — TRAMADOL-ACETAMINOPHEN 37.5-325 MG PO TABS: 1.0000 | ORAL_TABLET | Freq: Three times a day (TID) | ORAL | 0 refills | Status: AC | PRN

## 2020-05-11 NOTE — Telephone Encounter (Signed)
error 

## 2020-05-11 NOTE — Progress Notes (Signed)
Subjective:  Patient ID: Amber Green, female    DOB: 03-13-1966  Age: 54 y.o. MRN: [id number]  CC: Insect Bite (more red today an itches and now going down hand//pt took benedrayl but didn't seem to help)  HPI Ms. Devivo presents with worsening left middle finger and hand swelling. She received depomedrol 40mg  IM yesterday and took 50mg  of benadryl last night. Reports throbbing pain and limited ROM in left middle finger. Denies any fever or paresthesia or numbness.  Reviewed past Medical, Social and Family history today.  Outpatient Medications Prior to Visit  Medication Sig Dispense Refill  . cephALEXin (KEFLEX) 500 MG capsule Take 1 capsule (500 mg total) by mouth 2 (two) times daily. 14 capsule 0  . diphenhydrAMINE (BENADRYL ALLERGY) 25 MG tablet Take 1 tablet (25 mg total) by mouth every 8 (eight) hours as needed for itching. 30 tablet 0  . Melatonin 5 MG CAPS Take by mouth at bedtime.    . Multiple Vitamin (MULTIVITAMIN) tablet Take 1 tablet by mouth daily.    . nitroGLYCERIN (NITROSTAT) 0.4 MG SL tablet Place 1 tablet (0.4 mg total) under the tongue every 5 (five) minutes as needed for chest pain. Maximum of 3tabs 22mins apart in 24hrs. Call 911 if no improvement 20 tablet 0  . sulfamethoxazole-trimethoprim (BACTRIM DS) 800-160 MG tablet Take 1 tablet by mouth 2 (two) times daily. 6 tablet 0   No facility-administered medications prior to visit.    ROS See HPI  Objective:  BP 112/64   Pulse 80   Temp (!) 97.4 F (36.3 C) (Tympanic)   Ht 5' 9.5" (1.765 m)   Wt 198 lb (89.8 kg)   LMP 11/17/2016 (Approximate)   BMI 28.82 kg/m   Physical Exam Vitals reviewed.  Musculoskeletal:        General: Swelling and tenderness present.     Left hand: Swelling and tenderness present. No lacerations. Decreased range of motion. Normal sensation. Normal capillary refill. Normal pulse.       Hands:  Neurological:     Mental Status: She is alert.    Assessment & Plan:    This visit occurred during the SARS-CoV-2 public health emergency.  Safety protocols were in place, including screening questions prior to the visit, additional usage of staff PPE, and extensive cleaning of exam room while observing appropriate contact time as indicated for disinfecting solutions.   Tanaysha was seen today for insect bite.  Diagnoses and all orders for this visit:  Allergic reaction to insect sting, accidental or unintentional, initial encounter -     DG Finger Middle Left -     predniSONE (DELTASONE) 20 MG tablet; Take 2 tablets (40 mg total) by mouth daily with breakfast. -     traMADol-acetaminophen (ULTRACET) 37.5-325 MG tablet; Take 1 tablet by mouth every 8 (eight) hours as needed for up to 3 days for severe pain.  Swelling of finger joint of left hand -     predniSONE (DELTASONE) 20 MG tablet; Take 2 tablets (40 mg total) by mouth daily with breakfast. -     traMADol-acetaminophen (ULTRACET) 37.5-325 MG tablet; Take 1 tablet by mouth every 8 (eight) hours as needed for up to 3 days for severe pain.  Start oral antibiotics. Continue bendaryl 25mg  every 8hrs Start oral prednisone. Use tramadol/acetaminophen for severe pain.  Reviewed x-ray images and collaborated with Dr. Stann Mainland (on call provider with [facility]): soft tissue swelling, no fracture and no foreign object. Compartment syndrome is not  a concern, hence no need for ortho referral at this time.She is to take medications as prescribed above Continue cold compress and elevation. Call office for video appt if no improvement in 4days.  Problem List Items Addressed This Visit    None    Visit Diagnoses    Allergic reaction to insect sting, accidental or unintentional, initial encounter    -  Primary   Relevant Medications   predniSONE (DELTASONE) 20 MG tablet   traMADol-acetaminophen (ULTRACET) 37.5-325 MG tablet   Other Relevant Orders   DG Finger Middle Left (Completed)   Swelling of finger joint of  left hand       Relevant Medications   predniSONE (DELTASONE) 20 MG tablet   traMADol-acetaminophen (ULTRACET) 37.5-325 MG tablet      Follow-up: Return if symptoms worsen or fail to improve.  Wilfred Lacy, NP

## 2020-05-11 NOTE — Patient Instructions (Addendum)
Start oral antibiotics. Continue bendaryl 25mg  every 8hrs Start oral prednisone. Use tramadol/acetaminophen for severe pain. You will be contacted with results  Continue cold compress and elevation above shoulder level

## 2020-06-14 NOTE — Progress Notes (Deleted)
54 y.o. G2P2 Single White or Caucasian female here for annual exam.    Patient's last menstrual period was 11/17/2016 (approximate).          Sexually active: {yes no:314532}  The current method of family planning is post menopausal status.    Exercising: {yes no:314532}  {types:19826} Smoker:  {YES NO:22349}  Health Maintenance: Pap:  10-19-16 neg HPV HR neg, 04-11-2019 neg History of abnormal Pap:  no MMG:  02-25-2020 category b density birads 1:neg Colonoscopy:  04-06-17 f/u 4yrs BMD:   none TDaP:  2016 Pneumonia vaccine(s):  no Shingrix:   *** Hep C testing: no Screening Labs: ***   reports that she has never smoked. She has never used smokeless tobacco. She reports current alcohol use of about 1.0 - 2.0 standard drink of alcohol per week. She reports that she does not use drugs.  Past Medical History:  Diagnosis Date  . Arthritis   . Clostridium difficile colitis 2003  . Scoliosis     Past Surgical History:  Procedure Laterality Date  . ABDOMINAL HYSTERECTOMY  1987  . APPENDECTOMY  1987  . 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 Ovary Removed Right   . RSO  1987   right ovaria cyst removal    Current Outpatient Medications  Medication Sig Dispense Refill  . cephALEXin (KEFLEX) 500 MG capsule Take 1 capsule (500 mg total) by mouth 2 (two) times daily. 14 capsule 0  . diphenhydrAMINE (BENADRYL ALLERGY) 25 MG tablet Take 1 tablet (25 mg total) by mouth every 8 (eight) hours as needed for itching. 30 tablet 0  . Melatonin 5 MG CAPS Take by mouth at bedtime.    . Multiple Vitamin (MULTIVITAMIN) tablet Take 1 tablet by mouth daily.    . nitroGLYCERIN (NITROSTAT) 0.4 MG SL tablet Place 1 tablet (0.4 mg total) under the tongue every 5 (five) minutes as needed for chest pain. Maximum of 3tabs 67mins apart in 24hrs. Call 911 if no improvement 20 tablet 0  . predniSONE (DELTASONE) 20 MG tablet Take 2 tablets (40 mg total) by mouth daily with breakfast. 6 tablet 0  .  sulfamethoxazole-trimethoprim (BACTRIM DS) 800-160 MG tablet Take 1 tablet by mouth 2 (two) times daily. 6 tablet 0   No current facility-administered medications for this visit.    Family History  Problem Relation Age of Onset  . Diabetes Maternal Grandfather   . Hypertension Maternal Grandfather   . Stroke Maternal Grandfather   . Lung cancer Maternal Grandmother   . Breast cancer Mother 73       mastectomy  . Hypothyroidism Mother   . Skin cancer Brother        not melanoma  . Hypertension Father   . Colon cancer Neg Hx   . Esophageal cancer Neg Hx   . Liver cancer Neg Hx   . Pancreatic cancer Neg Hx   . Rectal cancer Neg Hx   . Stomach cancer Neg Hx     Review of Systems  Exam:   LMP 11/17/2016 (Approximate)      General appearance: alert, cooperative and appears stated age Head: Normocephalic, without obvious abnormality, atraumatic Neck: no adenopathy, supple, symmetrical, trachea midline and thyroid {EXAM; THYROID:18604} Lungs: clear to auscultation bilaterally Breasts: {Exam; breast:13139::"normal appearance, no masses or tenderness"} Heart: regular rate and rhythm Abdomen: soft, non-tender; bowel sounds normal; no masses,  no organomegaly Extremities: extremities normal, atraumatic, no cyanosis or edema Skin: Skin color, texture, turgor normal. No rashes or lesions Lymph nodes:  Cervical, supraclavicular, and axillary nodes normal. No abnormal inguinal nodes palpated Neurologic: Grossly normal   Pelvic: External genitalia:  no lesions              Urethra:  normal appearing urethra with no masses, tenderness or lesions              Bartholins and Skenes: normal                 Vagina: normal appearing vagina with normal color and discharge, no lesions              Cervix: {exam; cervix:14595}              Pap taken: {yes no:314532} Bimanual Exam:  Uterus:  {exam; uterus:12215}              Adnexa: {exam; adnexa:12223}               Rectovaginal: Confirms                Anus:  normal sphincter tone, no lesions  Chaperone, ***Terence Lux, CMA, was present for exam.  A:  Well Woman with normal exam  P:   {plan; gyn:5269::"mammogram","pap smear","return annually or prn"}

## 2020-06-17 ENCOUNTER — Ambulatory Visit: Payer: BC Managed Care – PPO | Admitting: Obstetrics & Gynecology

## 2020-06-21 NOTE — Progress Notes (Signed)
54 y.o. [place] or Caucasian female here for annual exam.  Just got back from [place].  Brother lives near parents, who are divorced.  Mother just has trigeminal neuralgia that has been an on an off issue for 5 + years.  She saw a neurologist and surgery was recommended.  This has been a good surgery but she had afib after the surgery.  She had to go to rehab.  Mother is better but having her parents so far away is a stressor.  Mother lives independently and father lives in independent living.  Pt reports her car broke down on the way home.  Lastly, son is now having trouble at school.  He has been cutting class.  He's smoking pot.    Denies vaginal bleeding.     Patient's last menstrual period was 11/17/2016 (approximate).          Sexually active: No.  The current method of family planning is post menopausal status.    Exercising: No.  exercise Smoker:  no  Health Maintenance: Pap:  10-19-16 neg HPV HR neg, 04-11-2019 neg History of abnormal Pap:  no MMG:  02-25-2020 category b density birads 1:neg Colonoscopy:  04-06-17 f/u 23yrs BMD:   none TDaP:  2016 Pneumonia vaccine(s):  Not done Shingrix:   Not done Hep C testing: not done Screening Labs: done 03/2019   reports that she has never smoked. She has never used smokeless tobacco. She reports current alcohol use of about 1.0 - 2.0 standard drink of alcohol per week. She reports that she does not use drugs.  Past Medical History:  Diagnosis Date  . Arthritis   . Clostridium difficile colitis 2003  . Scoliosis     Past Surgical History:  Procedure Laterality Date  . APPENDECTOMY  1987  . 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 RSO  1987   with appendectomy    Current Outpatient Medications  Medication Sig Dispense Refill  . Melatonin 5 MG CAPS Take by mouth at bedtime.    . Multiple Vitamin (MULTIVITAMIN) tablet Take 1 tablet by mouth daily.    . nitroGLYCERIN (NITROSTAT) 0.4 MG SL tablet Place 1 tablet (0.4 mg total) under the tongue  every 5 (five) minutes as needed for chest pain. Maximum of 3tabs 67mins apart in 24hrs. Call 911 if no improvement (Patient not taking: Reported on 06/22/2020) 20 tablet 0   No current facility-administered medications for this visit.    Family History  Problem Relation Age of Onset  . Diabetes Maternal Grandfather   . Hypertension Maternal Grandfather   . Stroke Maternal Grandfather   . Lung cancer Maternal Grandmother   . Breast cancer Mother 87       mastectomy  . Hypothyroidism Mother   . Skin cancer Brother        not melanoma  . Hypertension Father   . Colon cancer Neg Hx   . Esophageal cancer Neg Hx   . Liver cancer Neg Hx   . Pancreatic cancer Neg Hx   . Rectal cancer Neg Hx   . Stomach cancer Neg Hx     Review of Systems  Constitutional: Negative.   HENT: Negative.   Eyes: Negative.   Respiratory: Negative.   Cardiovascular: Negative.   Gastrointestinal: Negative.   Endocrine: Negative.   Genitourinary: Negative.   Musculoskeletal: Negative.   Skin: Negative.   Allergic/Immunologic: Negative.   Neurological: Negative.   Hematological: Negative.   Psychiatric/Behavioral: Negative.     Exam:  BP 108/64   Pulse 80   Resp 16   Ht 5' 9.75" (1.772 m)   Wt 201 lb (91.2 kg)   LMP 11/17/2016 (Approximate)   BMI 29.05 kg/m   Height: 5' 9.75" (177.2 cm)  General appearance: alert, cooperative and appears stated age Head: Normocephalic, without obvious abnormality, atraumatic Neck: no adenopathy, supple, symmetrical, trachea midline and thyroid normal to inspection and palpation Lungs: clear to auscultation bilaterally Breasts: normal appearance, no masses or tenderness Heart: regular rate and rhythm Abdomen: soft, non-tender; bowel sounds normal; no masses,  no organomegaly Extremities: extremities normal, atraumatic, no cyanosis or edema Skin: Skin color, texture, turgor normal. No rashes or lesions Lymph nodes: Cervical, supraclavicular, and axillary nodes  normal. No abnormal inguinal nodes palpated Neurologic: Grossly normal   Pelvic: External genitalia:  no lesions              Urethra:  normal appearing urethra with no masses, tenderness or lesions              Bartholins and Skenes: normal                 Vagina: normal appearing vagina with normal color and discharge, no lesions              Cervix: no lesions              Pap taken: Yes.   Bimanual Exam:  Uterus:  normal size, contour, position, consistency, mobility, non-tender              Adnexa: normal adnexa and no mass, fullness, tenderness               Rectovaginal: Confirms               Anus:  normal sphincter tone, no lesions  Chaperone, Terence Lux, CMA, was present for exam.  A:  Well Woman with normal exam PMP, no HRT Family hx of breast cancer (mother aged 10).  Tyrer Cusick model calculation for lifetime breast cancer is >17% Family stressors H/o RSO  P:   Mammogram guidelines reviewed.  Limited Breast MRI discussed last year.  Declined.   pap smear and HR HPV obtained today BMD discussed.  Will plan closer to age 16  Colonoscopy 2018.  Follow up 10 years.   Pfizer vaccine update discussed.  Pt is also going to plan to have Shingrix this year. Lab work done 03/2019 Return annually or prn

## 2020-06-22 ENCOUNTER — Encounter: Payer: Self-pay | Admitting: Obstetrics & Gynecology

## 2020-06-22 ENCOUNTER — Other Ambulatory Visit (HOSPITAL_COMMUNITY)
Admission: RE | Admit: 2020-06-22 | Discharge: 2020-06-22 | Disposition: A | Discharge status: Home / Self Care | Payer: BC Managed Care – PPO | Source: Ambulatory Visit | Attending: Obstetrics & Gynecology | Admitting: Obstetrics & Gynecology

## 2020-06-22 ENCOUNTER — Ambulatory Visit (INDEPENDENT_AMBULATORY_CARE_PROVIDER_SITE_OTHER): Payer: BC Managed Care – PPO | Admitting: Obstetrics & Gynecology

## 2020-06-22 ENCOUNTER — Other Ambulatory Visit: Payer: Self-pay

## 2020-06-22 VITALS — BP 108/64 | HR 80 | Resp 16 | Ht 69.75 in | Wt 201.0 lb

## 2020-06-22 DIAGNOSIS — Z124 Encounter for screening for malignant neoplasm of cervix: Secondary | ICD-10-CM | POA: Insufficient documentation

## 2020-06-22 DIAGNOSIS — Z01419 Encounter for gynecological examination (general) (routine) without abnormal findings: Secondary | ICD-10-CM | POA: Diagnosis not present

## 2020-06-22 NOTE — Patient Instructions (Signed)
[facility], [address], [place] [address] [phone] [email or web address] Fax [phone]

## 2020-06-24 LAB — CYTOLOGY - PAP
Comment: NEGATIVE
Diagnosis: NEGATIVE
High risk HPV: NEGATIVE

## 2020-07-03 ENCOUNTER — Ambulatory Visit: Payer: BC Managed Care – PPO | Attending: Internal Medicine

## 2020-07-03 DIAGNOSIS — Z23 Encounter for immunization: Secondary | ICD-10-CM

## 2020-07-03 NOTE — Progress Notes (Signed)
   Covid-19 Vaccination Clinic  Name:  Amber Green    MRN: [id number] DOB: 11/28/1965  07/03/2020  Amber Green was observed post Covid-19 immunization for 15 minutes without incident. She was provided with Vaccine Information Sheet and instruction to access the V-Safe system.   Amber Green was instructed to call 911 with any severe reactions post vaccine: Marland Kitchen Difficulty breathing  . Swelling of face and throat  . A fast heartbeat  . A bad rash all over body  . Dizziness and weakness

## 2020-09-20 ENCOUNTER — Encounter: Payer: Self-pay | Admitting: Nurse Practitioner

## 2020-09-20 ENCOUNTER — Telehealth: Payer: Self-pay | Admitting: Nurse Practitioner

## 2020-09-21 NOTE — Telephone Encounter (Signed)
Patient scheduled for tomorrow

## 2020-09-22 ENCOUNTER — Encounter: Payer: Self-pay | Admitting: Nurse Practitioner

## 2020-09-22 ENCOUNTER — Telehealth (INDEPENDENT_AMBULATORY_CARE_PROVIDER_SITE_OTHER): Payer: BC Managed Care – PPO | Admitting: Nurse Practitioner

## 2020-09-22 VITALS — Ht 70.0 in | Wt 198.0 lb

## 2020-09-22 DIAGNOSIS — J0140 Acute pansinusitis, unspecified: Secondary | ICD-10-CM | POA: Diagnosis not present

## 2020-09-22 MED ORDER — AMOXICILLIN-POT CLAVULANATE 875-125 MG PO TABS: 1.0000 | ORAL_TABLET | Freq: Two times a day (BID) | ORAL | 0 refills | Status: DC

## 2020-09-22 MED ORDER — GUAIFENESIN ER 600 MG PO TB12: 600.0000 mg | ORAL_TABLET | Freq: Two times a day (BID) | ORAL | 0 refills | Status: DC

## 2020-09-22 MED ORDER — PREDNISONE 20 MG PO TABS: ORAL_TABLET | ORAL | 0 refills | Status: AC

## 2020-09-22 NOTE — Patient Instructions (Signed)
URI Instructions: Encourage adequate oral hydration. Use" Delsym" or" Robitussin" cough syrup varietis for cough.  You can use plain "Tylenol" or "Advi"l for fever, chills and achyness.   "Common cold" symptoms are usually triggered by a virus.  The antibiotics are usually not necessary. On average, a" viral cold" illness would take 4-7 days to resolve. Please, make an appointment if you are not better or if you're worse.

## 2020-09-22 NOTE — Progress Notes (Signed)
Virtual Visit via Video Note  I connected with@ on 09/22/20 at 11:00 AM EST by a video enabled telemedicine application and verified that I am speaking with the correct person using two identifiers.  Location: Patient:Home Provider: Office Participants: patient and provider  I discussed the limitations of evaluation and management by telemedicine and the availability of in person appointments. I also discussed with the patient that there may be a patient responsible charge related to this service. The patient expressed understanding and agreed to proceed.  CC:Pt c/o sore throat, headaches, ear pain, sinus congestion, and productive cough with clear mucus x5 days. Pt has tried otc medication (advil cold and sinus) with no relief. Pt had a COVID test on Monday but has not received results yet.   History of Present Illness: Symptom onset 09/17/20 Positive COVID home kit on 09/18/2020 Pending PCR test collected 09/19/2020. Completed 3doses of COVID vaccine. Completed influenza vaccine this season.  URI  This is a new problem. The current episode started in the past 7 days. The problem has been gradually worsening. There has been no fever. Associated symptoms include congestion, coughing, headaches, joint pain, rhinorrhea and sinus pain. Pertinent negatives include no ear pain, joint swelling, nausea, neck pain, plugged ear sensation, rash, sneezing, sore throat, swollen glands, vomiting or wheezing. She has tried acetaminophen, decongestant, increased fluids, steam and antihistamine for the symptoms. The treatment provided no relief.  no chest pain, cough or palpitation or dizziness. had similar symptoms last year 08/2020, symptoms resolved with oral prednisone and augmentin.  Observations/Objective: Physical Exam Vitals reviewed.  Constitutional:      General: She is not in acute distress.    Appearance: She is ill-appearing.  Pulmonary:     Effort: Pulmonary effort is normal.   Neurological:     Mental Status: She is alert and oriented to person, place, and time.    Assessment and Plan: Mckensi was seen today for acute visit.  Diagnoses and all orders for this visit:  Acute non-recurrent pansinusitis -     amoxicillin-clavulanate (AUGMENTIN) 875-125 MG tablet; Take 1 tablet by mouth 2 (two) times daily. -     predniSONE (DELTASONE) 20 MG tablet; Take 2 tablets (40 mg total) by mouth daily with breakfast for 1 day, THEN 1.5 tablets (30 mg total) daily with breakfast for 1 day, THEN 1 tablet (20 mg total) daily with breakfast for 1 day, THEN 0.5 tablets (10 mg total) daily with breakfast for 1 day. -     guaiFENesin (MUCINEX) 600 MG 12 hr tablet; Take 1 tablet (600 mg total) by mouth 2 (two) times daily.   Follow Up Instructions: See above   I discussed the assessment and treatment plan with the patient. The patient was provided an opportunity to ask questions and all were answered. The patient agreed with the plan and demonstrated an understanding of the instructions.   The patient was advised to call back or seek an in-person evaluation if the symptoms worsen or if the condition fails to improve as anticipated.  Wilfred Lacy, NP

## 2020-09-23 NOTE — Telephone Encounter (Signed)
ERROR

## 2021-01-24 ENCOUNTER — Other Ambulatory Visit: Payer: Self-pay | Admitting: Nurse Practitioner

## 2021-01-24 DIAGNOSIS — Z1231 Encounter for screening mammogram for malignant neoplasm of breast: Secondary | ICD-10-CM

## 2021-02-08 ENCOUNTER — Encounter: Payer: Self-pay | Admitting: Nurse Practitioner

## 2021-02-08 ENCOUNTER — Ambulatory Visit: Payer: BC Managed Care – PPO | Admitting: Nurse Practitioner

## 2021-02-08 ENCOUNTER — Other Ambulatory Visit: Payer: Self-pay

## 2021-02-08 VITALS — BP 124/78

## 2021-02-08 DIAGNOSIS — N764 Abscess of vulva: Secondary | ICD-10-CM | POA: Diagnosis not present

## 2021-02-08 MED ORDER — SULFAMETHOXAZOLE-TRIMETHOPRIM 400-80 MG PO TABS: 1.0000 | ORAL_TABLET | Freq: Two times a day (BID) | ORAL | 0 refills | Status: AC

## 2021-02-08 NOTE — Progress Notes (Signed)
   Acute Office Visit  Subjective:    Patient ID: Amber Green, female    DOB: 1965-12-07, 55 y.o.   MRN: [id number]   HPI 55 y.o. presents today for boil on vagina. She first noticed it 3 days ago while bathing and it progressively got larger and more painful. It ruptured overnight while sleeping and she reports the fluid looked clear with blood.    Review of Systems  Constitutional: Negative.   Genitourinary: Positive for genital sores and vaginal pain.  Skin: Positive for wound.       Objective:    Physical Exam Constitutional:      Appearance: Normal appearance.  Genitourinary:      BP 124/78   LMP 11/17/2016 (Approximate)  Wt Readings from Last 3 Encounters:  09/22/20 198 lb (89.8 kg)  06/22/20 201 lb (91.2 kg)  05/11/20 198 lb (89.8 kg)        Assessment & Plan:   Problem List Items Addressed This Visit   None   Visit Diagnoses    Labial abscess    -  Primary   Relevant Medications   sulfamethoxazole-trimethoprim (BACTRIM) 400-80 MG tablet   Other Relevant Orders   WOUND CULTURE      Plan: Discussed risks and benefits of the procedure, as well as the alternatives. Verbal consent obtained from patient.  Incision & drainage performed of abscess of right labia. The area was prepped, cleaned with betadine, and locally anesthetized. #11 scalpel used to make surgical incision over area where drainage present. I then expressed any fluid that I could. Fluid mostly bloody in nature, fluid yellow when first expressed but unable to obtain specimen at that time. Unable to puncture deep area below superficial pocket. Area cleaned and no dressing required. Pad provided for any leftover drainage.   Bactrim 400-80 mg BID x 14 days. She will return if symptoms worsen or do not improve. Wound culture pending but may be inadequate specimen.   She is agreeable to plan.      Tamela Gammon DNP, 2:12 PM 02/08/2021

## 2021-02-11 LAB — WOUND CULTURE
MICRO NUMBER:: 11928087
RESULT:: NORMAL
SPECIMEN QUALITY:: ADEQUATE

## 2021-03-28 ENCOUNTER — Other Ambulatory Visit: Payer: Self-pay

## 2021-03-28 ENCOUNTER — Ambulatory Visit
Admission: RE | Admit: 2021-03-28 | Discharge: 2021-03-28 | Disposition: A | Discharge status: Home / Self Care | Payer: BC Managed Care – PPO | Source: Ambulatory Visit | Attending: Nurse Practitioner | Admitting: Nurse Practitioner

## 2021-03-28 DIAGNOSIS — Z1231 Encounter for screening mammogram for malignant neoplasm of breast: Secondary | ICD-10-CM

## 2021-05-17 ENCOUNTER — Emergency Department (HOSPITAL_BASED_OUTPATIENT_CLINIC_OR_DEPARTMENT_OTHER): Payer: BC Managed Care – PPO

## 2021-05-17 ENCOUNTER — Emergency Department (HOSPITAL_BASED_OUTPATIENT_CLINIC_OR_DEPARTMENT_OTHER)
Admission: EM | Admit: 2021-05-17 | Discharge: 2021-05-17 | Disposition: A | Discharge status: Home / Self Care | Payer: BC Managed Care – PPO | Attending: Emergency Medicine | Admitting: Emergency Medicine

## 2021-05-17 ENCOUNTER — Encounter: Payer: Self-pay | Admitting: Family Medicine

## 2021-05-17 ENCOUNTER — Encounter (HOSPITAL_BASED_OUTPATIENT_CLINIC_OR_DEPARTMENT_OTHER): Payer: Self-pay | Admitting: *Deleted

## 2021-05-17 ENCOUNTER — Other Ambulatory Visit: Payer: Self-pay

## 2021-05-17 ENCOUNTER — Ambulatory Visit: Payer: BC Managed Care – PPO | Admitting: Family Medicine

## 2021-05-17 VITALS — BP 106/70 | HR 84 | Temp 97.4°F | Ht 70.0 in | Wt 208.8 lb

## 2021-05-17 DIAGNOSIS — R1013 Epigastric pain: Secondary | ICD-10-CM | POA: Diagnosis present

## 2021-05-17 DIAGNOSIS — R100 Acute abdomen: Secondary | ICD-10-CM | POA: Diagnosis not present

## 2021-05-17 DIAGNOSIS — N949 Unspecified condition associated with female genital organs and menstrual cycle: Secondary | ICD-10-CM

## 2021-05-17 DIAGNOSIS — K449 Diaphragmatic hernia without obstruction or gangrene: Secondary | ICD-10-CM | POA: Diagnosis not present

## 2021-05-17 DIAGNOSIS — K219 Gastro-esophageal reflux disease without esophagitis: Secondary | ICD-10-CM | POA: Insufficient documentation

## 2021-05-17 DIAGNOSIS — N83292 Other ovarian cyst, left side: Secondary | ICD-10-CM | POA: Diagnosis not present

## 2021-05-17 DIAGNOSIS — K55069 Acute infarction of intestine, part and extent unspecified: Secondary | ICD-10-CM

## 2021-05-17 LAB — URINALYSIS, ROUTINE W REFLEX MICROSCOPIC
Bilirubin Urine: NEGATIVE
Glucose, UA: NEGATIVE mg/dL
Hgb urine dipstick: NEGATIVE
Ketones, ur: NEGATIVE mg/dL
Nitrite: NEGATIVE
Protein, ur: NEGATIVE mg/dL
Specific Gravity, Urine: 1.025 (ref 1.005–1.030)
pH: 6 (ref 5.0–8.0)

## 2021-05-17 LAB — CBC WITH DIFFERENTIAL/PLATELET
Abs Immature Granulocytes: 0.03 10*3/uL (ref 0.00–0.07)
Basophils Absolute: 0.1 10*3/uL (ref 0.0–0.1)
Basophils Relative: 1 %
Eosinophils Absolute: 0.2 10*3/uL (ref 0.0–0.5)
Eosinophils Relative: 2 %
HCT: 43.8 % (ref 36.0–46.0)
Hemoglobin: 14.2 g/dL (ref 12.0–15.0)
Immature Granulocytes: 0 %
Lymphocytes Relative: 30 %
Lymphs Abs: 2.5 10*3/uL (ref 0.7–4.0)
MCH: 28.8 pg (ref 26.0–34.0)
MCHC: 32.4 g/dL (ref 30.0–36.0)
MCV: 88.8 fL (ref 80.0–100.0)
Monocytes Absolute: 0.7 10*3/uL (ref 0.1–1.0)
Monocytes Relative: 8 %
Neutro Abs: 4.8 10*3/uL (ref 1.7–7.7)
Neutrophils Relative %: 59 %
Platelets: 251 10*3/uL (ref 150–400)
RBC: 4.93 MIL/uL (ref 3.87–5.11)
RDW: 12.4 % (ref 11.5–15.5)
WBC: 8.2 10*3/uL (ref 4.0–10.5)
nRBC: 0 % (ref 0.0–0.2)

## 2021-05-17 LAB — COMPREHENSIVE METABOLIC PANEL
ALT: 19 U/L (ref 0–44)
AST: 18 U/L (ref 15–41)
Albumin: 4.4 g/dL (ref 3.5–5.0)
Alkaline Phosphatase: 78 U/L (ref 38–126)
Anion gap: 11 (ref 5–15)
BUN: 18 mg/dL (ref 6–20)
CO2: 23 mmol/L (ref 22–32)
Calcium: 9.6 mg/dL (ref 8.9–10.3)
Chloride: 105 mmol/L (ref 98–111)
Creatinine, Ser: 0.85 mg/dL (ref 0.44–1.00)
GFR, Estimated: >60 mL/min (ref >60)
Glucose, Bld: 81 mg/dL (ref 70–99)
Potassium: 4.4 mmol/L (ref 3.5–5.1)
Sodium: 139 mmol/L (ref 135–145)
Total Bilirubin: 0.5 mg/dL (ref 0.3–1.2)
Total Protein: 7.2 g/dL (ref 6.5–8.1)

## 2021-05-17 LAB — URINALYSIS, MICROSCOPIC (REFLEX)

## 2021-05-17 LAB — LIPASE, BLOOD: Lipase: 39 U/L (ref 11–51)

## 2021-05-17 MED ORDER — OMEPRAZOLE 20 MG PO CPDR: 20.0000 mg | DELAYED_RELEASE_CAPSULE | Freq: Every day | ORAL | 0 refills | Status: DC

## 2021-05-17 MED ORDER — IOHEXOL 350 MG/ML SOLN
85.0000 mL | Freq: Once | INTRAVENOUS | Status: AC | PRN
  Administered 2021-05-17: 85 mL via INTRAVENOUS

## 2021-05-17 NOTE — Progress Notes (Signed)
Established Patient Office Visit  Subjective:  Patient ID: Amber Green, female    DOB: [birth year]  Age: 55 y.o. MRN: [id number]  CC:  Chief Complaint  Patient presents with   Abdominal Pain    C/O abdominal pains feels like muscle pain started last week. Some areas little hard and tender to touch. Feel exhausted more than often.     HPI Amber Green presents for evaluation of a 5 to 6-day history of mid abdominal pain.  Pain seems to run across the middle part of her abdomen.  There is been no nausea.  She does have a history of indigestion treated with Tums over the last year or 2.  She takes 2-3 doses at least weekly.  She denies nausea or vomiting.  There is been no fever or chills.  Denies weight loss or night sweats.  Stools have been normal.  Her colonoscopy was normal 2 years ago she tells me.  Normal Pap smear last year.  She tells of acute fatigue with this pain.  Denies dysuria, heme hematuria or unusual vaginal discharge.  Rarely drinks alcohol.  Last lipid profile was entirely normal with normal triglycerides.  She is a widow and lives at home with her 56 and 48 year old son.  Her husband passed 5 years ago from an acute MI.  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   RSO  1987   with appendectomy    Family History  Problem Relation Age of Onset   Diabetes Maternal Grandfather    Hypertension Maternal Grandfather    Stroke Maternal Grandfather    Lung cancer Maternal Grandmother    Breast cancer Mother 33       mastectomy   Hypothyroidism Mother    Skin cancer Brother        not melanoma   Hypertension Father    Colon cancer Neg Hx    Esophageal cancer Neg Hx    Liver cancer Neg Hx    Pancreatic cancer Neg Hx    Rectal cancer Neg Hx    Stomach cancer Neg Hx     Social History    Socioeconomic History   Marital status: Single    Spouse name: Not on file   Number of children: Not on file   Years of education: Not on file   Highest education level: Not on file  Occupational History   Not on file  Tobacco Use   Smoking status: Never   Smokeless tobacco: Never  Vaping Use   Vaping Use: Never used  Substance and Sexual Activity   Alcohol use: Yes    Alcohol/week: 1.0 - 2.0 standard drink    Types: 1 - 2 Standard drinks or equivalent per week   Drug use: No   Sexual activity: Not Currently    Partners: Male    Birth control/protection: Post-menopausal  Other Topics Concern   Not on file  Social History Narrative   Not on file   Social Determinants of Health   Financial Resource Strain: Not on file  Food Insecurity: Not on file  Transportation Needs: Not on file  Physical Activity: Not on file  Stress: Not on file  Social Connections: Not on file  Intimate Partner Violence: Not on file    Outpatient Medications Prior  to Visit  Medication Sig Dispense Refill   Melatonin 5 MG CAPS Take by mouth at bedtime.     Multiple Vitamin (MULTIVITAMIN) tablet Take 1 tablet by mouth daily.     nitroGLYCERIN (NITROSTAT) 0.4 MG SL tablet Place 1 tablet (0.4 mg total) under the tongue every 5 (five) minutes as needed for chest pain. Maximum of 3tabs 39mns apart in 24hrs. Call 911 if no improvement (Patient not taking: No sig reported) 20 tablet 0   No facility-administered medications prior to visit.    No Known Allergies  ROS Review of Systems  Constitutional:  Negative for chills, diaphoresis, fatigue, fever and unexpected weight change.  HENT: Negative.    Eyes:  Negative for photophobia and visual disturbance.  Respiratory: Negative.    Cardiovascular: Negative.   Gastrointestinal:  Positive for abdominal pain. Negative for anal bleeding, blood in stool, constipation, diarrhea, nausea and vomiting.  Genitourinary:  Negative for dysuria, frequency,  hematuria, urgency and vaginal discharge.  Psychiatric/Behavioral: Negative.       Objective:    Physical Exam Vitals and nursing note reviewed.  Constitutional:      General: She is not in acute distress.    Appearance: She is well-developed. She is not ill-appearing, toxic-appearing or diaphoretic.  HENT:     Head: Normocephalic and atraumatic.     Mouth/Throat:     Mouth: Mucous membranes are moist.     Pharynx: Oropharynx is clear. No pharyngeal swelling or oropharyngeal exudate.  Eyes:     General: No scleral icterus.    Extraocular Movements: Extraocular movements intact.     Pupils: Pupils are equal, round, and reactive to light.  Cardiovascular:     Rate and Rhythm: Normal rate and regular rhythm.  Pulmonary:     Effort: Pulmonary effort is normal.     Breath sounds: Normal breath sounds.  Abdominal:     General: Abdomen is flat. Bowel sounds are decreased. There is no distension.     Tenderness: There is abdominal tenderness in the epigastric area. There is guarding. There is no right CVA tenderness or left CVA tenderness.  Skin:    General: Skin is warm and dry.  Neurological:     Mental Status: She is alert and oriented to person, place, and time.  Psychiatric:        Mood and Affect: Mood normal.        Behavior: Behavior normal.    BP 106/70 (BP Location: Right Arm, Patient Position: Sitting, Cuff Size: Large)   Pulse 84   Temp (!) 97.4 F (36.3 C) (Temporal)   Ht '5\' 10"'$  (1.778 m)   Wt 208 lb 12.8 oz (94.7 kg)   LMP 11/17/2016 (Approximate)   SpO2 96%   BMI 29.96 kg/m  Wt Readings from Last 3 Encounters:  05/17/21 208 lb 12.8 oz (94.7 kg)  09/22/20 198 lb (89.8 kg)  06/22/20 201 lb (91.2 kg)     Health Maintenance Due  Topic Date Due   HIV Screening  Never done   Hepatitis C Screening  Never done   INFLUENZA VACCINE  04/18/2021    There are no preventive care reminders to display for this patient.  Lab Results  Component Value Date   TSH  1.210 04/11/2019   Lab Results  Component Value Date   WBC 8.8 01/14/2019   HGB 13.3 01/14/2019   HCT 42.7 01/14/2019   MCV 91.4 01/14/2019   PLT 235 01/14/2019   Lab Results  Component Value  Date   NA 141 01/14/2019   K 3.9 01/14/2019   CO2 26 01/14/2019   GLUCOSE 99 01/14/2019   BUN 16 01/14/2019   CREATININE 0.88 01/14/2019   BILITOT 0.6 10/19/2016   ALKPHOS 54 10/19/2016   AST 15 10/19/2016   ALT 15 10/19/2016   PROT 6.4 10/19/2016   ALBUMIN 4.3 10/19/2016   CALCIUM 9.2 01/14/2019   ANIONGAP 8 01/14/2019   Lab Results  Component Value Date   CHOL 197 04/11/2019   Lab Results  Component Value Date   HDL 86 04/11/2019   Lab Results  Component Value Date   LDLCALC 98 04/11/2019   Lab Results  Component Value Date   TRIG 63 04/11/2019   Lab Results  Component Value Date   CHOLHDL 2.3 04/11/2019   Lab Results  Component Value Date   HGBA1C 5.2 04/11/2019      Assessment & Plan:   Problem List Items Addressed This Visit       Other   Epigastric pain   Acute abdomen - Primary    No orders of the defined types were placed in this encounter.   Follow-up: Return Go to ER now., for Follow up with me in 3-4 days..  We discussed during blood work today and ordering a CT scan.  Believe that her abdomen could be an acute and that it would be best for her to be seen on an emergent basis.  Libby Maw, MD

## 2021-05-17 NOTE — Discharge Instructions (Addendum)
You are seen in the emergency department for abdominal pain.  Your lab work and urinalysis was unremarkable.  Your CAT scan showed multiple findings including an omental infarct, hiatal hernia, ovarian cyst, and a lesion near the foramen of the T12 vertebra.  These will need follow-up with a primary care doctor.  you have been given a copy of your radiology report.  Please bring this to your primary care doctor for follow-up.  We are prescribing some acid medication.  Return to the emergency department if any worsening or concerning symptoms

## 2021-05-17 NOTE — ED Notes (Signed)
To CT

## 2021-05-17 NOTE — ED Provider Notes (Signed)
[facility] EMERGENCY DEPARTMENT Provider Note   CSN: [id number] Arrival date & time: 05/17/21  1631     History Chief Complaint  Patient presents with   Abdominal Pain    Amber Green is a 55 y.o. female.  She was here with a complaint of about a week of upper abdominal pain.  She feels like it shifting from left side to right.  She said it feels sore and achy.  Worse with palpation.  Not associate with any fevers chills nausea vomiting chest pain shortness of breath constipation diarrhea or urinary symptoms.  She went to her PCP today who recommended she come to the emergency department for labs and a CAT scan.  She has prior history of ovarian cyst surgery and appendectomy.  The history is provided by the patient.  Abdominal Pain Pain location:  Epigastric Pain quality: aching   Pain severity now: 8/10 at worst, 0/10 now. Onset quality:  Gradual Duration:  1 week Timing:  Intermittent Progression:  Unchanged Chronicity:  New Context: not recent illness, not recent travel, not retching and not trauma   Relieved by:  None tried Worsened by:  Palpation Ineffective treatments:  None tried Associated symptoms: no chest pain, no constipation, no cough, no diarrhea, no dysuria, no fever, no hematemesis, no hematochezia, no hematuria, no nausea, no shortness of breath, no sore throat and no vomiting       Past Medical History:  Diagnosis Date   Arthritis    Clostridium difficile colitis 2003   Scoliosis     Patient Active Problem List   Diagnosis Date Noted   Epigastric pain 05/17/2021   Acute abdomen 05/17/2021   Lipoma of left lower extremity 11/16/2017   H/O scoliosis 01/25/2017   Back pain 01/01/2017   Grief 01/01/2017    Past Surgical History:  Procedure Laterality Date   APPENDECTOMY  1987   Herrington rods  07/1979   RSO  1987   with appendectomy     OB History     Gravida  2   Para  2   Term      Preterm      AB      Living   2      SAB      IAB      Ectopic      Multiple      Live Births              Family History  Problem Relation Age of Onset   Diabetes Maternal Grandfather    Hypertension Maternal Grandfather    Stroke Maternal Grandfather    Lung cancer Maternal Grandmother    Breast cancer Mother 61       mastectomy   Hypothyroidism Mother    Skin cancer Brother        not melanoma   Hypertension Father    Colon cancer Neg Hx    Esophageal cancer Neg Hx    Liver cancer Neg Hx    Pancreatic cancer Neg Hx    Rectal cancer Neg Hx    Stomach cancer Neg Hx     Social History   Tobacco Use   Smoking status: Never   Smokeless tobacco: Never  Vaping Use   Vaping Use: Never used  Substance Use Topics   Alcohol use: Yes    Alcohol/week: 1.0 - 2.0 standard drink    Types: 1 - 2 Standard drinks or equivalent per week   Drug use:  No    Home Medications Prior to Admission medications   Medication Sig Start Date End Date Taking? Authorizing Provider  Melatonin 5 MG CAPS Take by mouth at bedtime.    [provider]  Multiple Vitamin (MULTIVITAMIN) tablet Take 1 tablet by mouth daily.    [provider]    Allergies    Patient has no known allergies.  Review of Systems   Review of Systems  Constitutional:  Negative for fever.  HENT:  Negative for sore throat.   Eyes:  Negative for visual disturbance.  Respiratory:  Negative for cough and shortness of breath.   Cardiovascular:  Negative for chest pain.  Gastrointestinal:  Positive for abdominal pain. Negative for constipation, diarrhea, hematemesis, hematochezia, nausea and vomiting.  Genitourinary:  Negative for dysuria and hematuria.  Musculoskeletal:  Negative for neck pain.  Skin:  Negative for rash.  Neurological:  Negative for headaches.   Physical Exam Updated Vital Signs BP 120/74   Pulse 88   Temp 98.4 F (36.9 C)   Resp 16   Ht '5\' 10"'$  (1.778 m)   Wt 94.3 kg   LMP 11/17/2016  (Approximate)   SpO2 99%   BMI 29.84 kg/m   Physical Exam Vitals and nursing note reviewed.  Constitutional:      General: She is not in acute distress.    Appearance: Normal appearance. She is well-developed.  HENT:     Head: Normocephalic and atraumatic.  Eyes:     Conjunctiva/sclera: Conjunctivae normal.  Cardiovascular:     Rate and Rhythm: Normal rate and regular rhythm.     Heart sounds: No murmur heard. Pulmonary:     Effort: Pulmonary effort is normal. No respiratory distress.     Breath sounds: Normal breath sounds.  Abdominal:     Palpations: Abdomen is soft.     Tenderness: There is abdominal tenderness in the epigastric area. There is no guarding or rebound.  Musculoskeletal:        General: No deformity or signs of injury. Normal range of motion.     Cervical back: Neck supple.  Skin:    General: Skin is warm and dry.  Neurological:     General: No focal deficit present.     Mental Status: She is alert.     Gait: Gait normal.    ED Results / Procedures / Treatments   Labs (all labs ordered are listed, but only abnormal results are displayed) Labs Reviewed  URINALYSIS, ROUTINE W REFLEX MICROSCOPIC - Abnormal; Notable for the following components:      Result Value   Leukocytes,Ua MODERATE (*)    All other components within normal limits  URINALYSIS, MICROSCOPIC (REFLEX) - Abnormal; Notable for the following components:   Bacteria, UA RARE (*)    All other components within normal limits  COMPREHENSIVE METABOLIC PANEL  LIPASE, BLOOD  CBC WITH DIFFERENTIAL/PLATELET    EKG None  Radiology CT Abdomen Pelvis W Contrast  Result Date: 05/17/2021 CLINICAL DATA:  Epigastric pain in a 55 year old female. EXAM: CT ABDOMEN AND PELVIS WITH CONTRAST TECHNIQUE: Multidetector CT imaging of the abdomen and pelvis was performed using the standard protocol following bolus administration of intravenous contrast. CONTRAST:  71m OMNIPAQUE IOHEXOL 350 MG/ML SOLN  COMPARISON:  CT angiography of the chest from April of 2020. FINDINGS: Lower chest: Signs of spinal fusion are incidentally noted and incompletely visualized on the current study. There is no effusion or consolidation noted at the lung bases with minimal basilar atelectasis.  There is a moderate hiatal hernia. Question of mild gastric thickening without significant surrounding stranding. Approximately 30% of the stomach is now herniated into the chest, increased from previous imaging from 2020. Hepatobiliary: No focal, suspicious hepatic lesion. No pericholecystic stranding. No biliary duct dilation. Portal vein is patent. Pancreas: Normal, without mass, inflammation or ductal dilatation. Spleen: Spleen normal size and contour. Adrenals/Urinary Tract: Adrenal glands are normal. There is an ovoid lesion adjacent to the RIGHT diaphragmatic crus and the T12 neural foramen that is well-circumscribed measuring 4.1 x 3.3 cm this is not associated with the kidney. There is no sign of hydronephrosis. No visible nephrolithiasis or ureteral calculi. The urinary bladder shows smooth contours. No suspicious renal lesion. No perinephric stranding. Stomach/Bowel: Gastrointestinal tract without acute process. Appendix not visualized, reportedly post appendectomy. Colonic diverticulosis. Some mild stranding along the LEFT colon adjacent to a diverticulum on image 61 of series 3. No free air. No focal fluid collection. Vascular/Lymphatic: Patent abdominal vasculature. Smooth contour of the aorta which shows normal caliber and of the IVC. There is no gastrohepatic or hepatoduodenal ligament lymphadenopathy. No retroperitoneal or mesenteric lymphadenopathy. No pelvic sidewall lymphadenopathy. Reproductive: Ovoid well-circumscribed RIGHT adnexal lesion measuring 5.3 x 3.6 x 2.6 cm cm showing water density with homogeneous appearance. No signs of LEFT adnexal lesion or pelvic mass associated with reproductive structures. Other: Area of  fat density surrounded by some stranding and thin rim of soft tissue in the central omentum (image 41/3) though ascites in the abdomen. Again no free air. Portal vein is patent. Musculoskeletal: Paraspinal lesion as described unchanged with respect to visualized portions dating back to April of 2020 but incompletely imaged on previous imaging. Signs of spinal fusion as before, incompletely imaged. Residual spinal curvature with similar appearance to prior imaging as well. 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 Moderate hiatal hernia. Approximately 30% of the stomach is now herniated into the chest, increased from previous imaging from 2020. Mild thickening about the proximal stomach and esophagus may reflect mild esophagogastritis. Stranding about the descending colon may represent mild diverticulitis or sequela of recent diverticulitis. Correlate with any signs of LEFT lower quadrant pain or history of recent diverticulitis. Ovoid lesion slightly greater than 4 cm along the RIGHT paraspinous region may represent a nerve sheath tumor/schwannoma but is incompletely evaluated, grossly unchanged compared to imaging from 2020. Correlation with previous imaging if available may be helpful. If no previous imaging was performed could consider MRI for further evaluation on a nonemergent basis. LEFT adnexal cyst with simple features slightly greater than 5 cm greatest dimension. Recommend follow-up US in 6-12 months. Note: This recommendation does not apply to premenarchal patients and to those with increased risk (genetic, family history, elevated tumor markers or other high-risk factors) of ovarian cancer. Reference: JACR 2020 Feb; 17(2):248-254 Signs of spinal fusion are incidentally noted and incompletely visualized on the current study. Aortic Atherosclerosis (ICD10-I70.0). Electronically Signed   By: Zetta Bills M.D.   On: 05/17/2021 20:00     Procedures Procedures   Medications Ordered in ED Medications  iohexol (OMNIPAQUE) 350 MG/ML injection 85 mL (85 mLs Intravenous Contrast Given 05/17/21 1900)    ED Course  I have reviewed the triage vital signs and the nursing notes.  Pertinent labs & imaging results that were available during my care of the patient were reviewed by me and considered in my medical decision making (see chart for details).  Clinical Course as of 05/18/21  [place] May 17, 2021  2021 Reviewed x-ray findings with Dr. Windle Guard from general surgery.  She said the omental infarct is just pain control and does not require any further work-up.  Reviewed findings with patient.  She is comfortable plan for outpatient management of this. [MB]    Clinical Course User Index [MB] Hayden Rasmussen, MD   MDM Rules/Calculators/A&P                          This patient complains of epigastric abdominal pain for 4 days; this involves an extensive number of treatment Options and is a complaint that carries with it a high risk of complications and Morbidity. The differential includes gastritis, peptic ulcer disease, cholelithiasis, cholecystitis, colitis, diverticulitis  I ordered, reviewed and interpreted labs, which included CBC with normal white count normal hemoglobin, chemistries and LFTs normal, urinalysis without clear signs of infection  I ordered imaging studies which included CT abdomen and pelvis and I independently    visualized and interpreted imaging which showed multiple findings including omental infarct, adnexal cyst, hiatal hernia, possible schwannoma.  These findings were reviewed with patient. Previous records obtained and reviewed in epic, no recent admissions I consulted Dr. Windle Guard general surgery and discussed lab and imaging findings  Critical Interventions: None  After the interventions stated above, I reevaluated the patient and found patient still to be having a fairly benign abdominal  exam other than point tenderness in the epigastrium.  Possibly this is related to the omental infarct or gastritis symptoms from her hiatal hernia.  Will place on PPI.  Recommended close follow-up with PCP and GYN.  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

## 2021-05-17 NOTE — ED Triage Notes (Signed)
Sent here from PMD for eval of 3 days of diffuse abd pain , denies n/v

## 2021-05-25 ENCOUNTER — Other Ambulatory Visit: Payer: Self-pay

## 2021-05-25 ENCOUNTER — Ambulatory Visit: Payer: BC Managed Care – PPO | Admitting: Nurse Practitioner

## 2021-05-25 VITALS — BP 120/78

## 2021-05-25 DIAGNOSIS — N9489 Other specified conditions associated with female genital organs and menstrual cycle: Secondary | ICD-10-CM

## 2021-05-25 NOTE — Progress Notes (Signed)
   Acute Office Visit  Subjective:    Patient ID: Amber Green, female    DOB: 12-04-1965, 55 y.o.   MRN: [id number]   HPI 55 y.o. presents today for ER follow up. Saw PCP 05/17/2021 with complaints of upper abdominal pain and it was recommended she been seen in ER for further imaging.  Pelvic CT showed "Ovoid well-circumscribed RIGHT adnexal lesion measuring 5.3 x 3.6 x 2.6 cm showing water density with homogeneous appearance. LEFT adnexal cyst with simple features slightly greater than 5 cm greatest dimension". She was also found to have hiatal hernia and omental infarct in central abdomen that is thought to be the source of pain. 10/2016 ultrasound was unremarkable and did not show any evidence of masses. 33 RSO for ovarian cyst, also had appendectomy at that time. She has no lower abdominal pain.    Review of Systems  Constitutional: Negative.   Gastrointestinal:  Positive for abdominal pain (Upper).  Genitourinary: Negative.       Objective:    Physical Exam Constitutional:      Appearance: Normal appearance.  Abdominal:     Tenderness: There is no abdominal tenderness. There is no guarding or rebound.    BP 120/78   LMP 11/17/2016 (Approximate)  Wt Readings from Last 3 Encounters:  05/17/21 208 lb (94.3 kg)  05/17/21 208 lb 12.8 oz (94.7 kg)  09/22/20 198 lb (89.8 kg)        Assessment & Plan:   Problem List Items Addressed This Visit   None Visit Diagnoses     Adnexal mass    -  Primary   Relevant Orders   US PELVIS TRANSVAGINAL NON-OB (TV ONLY)      Plan: Discussed CT results and recommendations for pelvic ultrasound for further evaluation. She is agreeable.      Tamela Gammon DNP, 10:17 AM 05/25/2021

## 2021-06-14 ENCOUNTER — Telehealth: Payer: Self-pay | Admitting: Family Medicine

## 2021-06-14 DIAGNOSIS — R1013 Epigastric pain: Secondary | ICD-10-CM

## 2021-06-15 MED ORDER — OMEPRAZOLE 20 MG PO CPDR
20.0000 mg | DELAYED_RELEASE_CAPSULE | Freq: Every day | ORAL | 0 refills | Status: DC
Start: 2021-06-15 — End: 2021-10-20

## 2021-06-15 NOTE — Telephone Encounter (Signed)
Rx refill sent. Sw, cma

## 2021-06-23 ENCOUNTER — Encounter: Payer: Self-pay | Admitting: Family Medicine

## 2021-06-23 ENCOUNTER — Telehealth (INDEPENDENT_AMBULATORY_CARE_PROVIDER_SITE_OTHER): Payer: BC Managed Care – PPO | Admitting: Family Medicine

## 2021-06-23 DIAGNOSIS — R300 Dysuria: Secondary | ICD-10-CM

## 2021-06-23 MED ORDER — NITROFURANTOIN MONOHYD MACRO 100 MG PO CAPS: 100.0000 mg | ORAL_CAPSULE | Freq: Two times a day (BID) | ORAL | 0 refills | Status: DC

## 2021-06-23 NOTE — Progress Notes (Signed)
Virtual Visit via Video Note  I connected with Amber Green  on 06/23/21 at  1:00 PM EDT by a video enabled telemedicine application and verified that I am speaking with the correct person using two identifiers.  Location patient: home, [place] Location provider:work or home office Persons participating in the virtual visit: patient, provider  I discussed the limitations of evaluation and management by telemedicine and the availability of in person appointments. The patient expressed understanding and agreed to proceed.   HPI:  Acute telemedicine visit for dysuria: -Onset: 3 days ago -Symptoms include: frequency, urgency, dysuria, small amount of pink tinge on TP when wiped once yesterday -this is similar to UTI in the past -Denies:NVD, fever,abd/flank pain, vaginal symptoms, concern for STI -Pertinent past medical history: UTI in the past - none recently -Pertinent medication allergies: No Known Allergies  ROS: See pertinent positives and negatives per HPI.  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   RSO  1987   with appendectomy     Current Outpatient Medications:    Melatonin 5 MG CAPS, Take by mouth at bedtime., Disp: , Rfl:    Multiple Vitamin (MULTIVITAMIN) tablet, Take 1 tablet by mouth daily., Disp: , Rfl:    nitrofurantoin, macrocrystal-monohydrate, (MACROBID) 100 MG capsule, Take 1 capsule (100 mg total) by mouth 2 (two) times daily., Disp: 14 capsule, Rfl: 0   omeprazole (PRILOSEC) 20 MG capsule, Take 1 capsule (20 mg total) by mouth daily., Disp: 90 capsule, Rfl: 0  EXAM:  VITALS per patient if applicable:  GENERAL: alert, oriented, appears well and in no acute distress  HEENT: atraumatic, conjunttiva clear, no obvious abnormalities on inspection of external nose and ears  NECK: normal movements of the  head and neck  LUNGS: on inspection no signs of respiratory distress, breathing rate appears normal, no obvious gross SOB, gasping or wheezing  CV: no obvious cyanosis  MS: moves all visible extremities without noticeable abnormality  PSYCH/NEURO: pleasant and cooperative, no obvious depression or anxiety, speech and thought processing grossly intact  ASSESSMENT AND PLAN:  Discussed the following assessment and plan:  Dysuria  -we discussed possible serious and likely etiologies, options for evaluation and workup, limitations of telemedicine visit vs in person visit, treatment, treatment risks and precautions. Pt is agreeable to treatment via telemedicine at this moment. Query cystitis vs other. Pt denies any blood in the urine. She want to try empiric treatment with macrobid for possible UTI. Advised to seek prompt in person care if worsening, new symptoms arise, or if is not improving with treatment over the next 1-2 days. Discussed options for inperson care if PCP office not available. Did let this patient know that I only do telemedicine on Tuesdays and Thursdays for [place]. Advised to schedule follow up visit with PCP or [facility] if any further questions or concerns to avoid delays in care.   I discussed the assessment and treatment plan with the patient. The patient was provided an opportunity to ask questions and all were answered. The patient agreed with the plan and demonstrated an understanding of the instructions.     Amber Kern, DO

## 2021-06-23 NOTE — Patient Instructions (Signed)
-  I sent the medication(s) we discussed to your pharmacy: Meds ordered this encounter  Medications   nitrofurantoin, macrocrystal-monohydrate, (MACROBID) 100 MG capsule    Sig: Take 1 capsule (100 mg total) by mouth 2 (two) times daily.    Dispense:  14 capsule    Refill:  0     I hope you are feeling better soon!  Seek in person care promptly if your symptoms worsen, new concerns arise or you are not improving with treatment.  It was nice to meet you today. I help [facility] out with telemedicine visits on Tuesdays and Thursdays and am available for visits on those days. If you have any concerns or questions following this visit please schedule a follow up visit with your Primary Care doctor or seek care at a local urgent care clinic to avoid delays in care.   

## 2021-06-27 ENCOUNTER — Other Ambulatory Visit: Payer: Self-pay

## 2021-06-27 ENCOUNTER — Encounter: Payer: Self-pay | Admitting: Nurse Practitioner

## 2021-06-27 ENCOUNTER — Other Ambulatory Visit (HOSPITAL_COMMUNITY)
Admission: RE | Admit: 2021-06-27 | Discharge: 2021-06-27 | Disposition: A | Discharge status: Home / Self Care | Payer: BC Managed Care – PPO | Source: Ambulatory Visit | Attending: Nurse Practitioner | Admitting: Nurse Practitioner

## 2021-06-27 ENCOUNTER — Ambulatory Visit (INDEPENDENT_AMBULATORY_CARE_PROVIDER_SITE_OTHER): Payer: BC Managed Care – PPO | Admitting: Nurse Practitioner

## 2021-06-27 VITALS — BP 118/78 | Ht 69.5 in | Wt 212.0 lb

## 2021-06-27 DIAGNOSIS — Z8349 Family history of other endocrine, nutritional and metabolic diseases: Secondary | ICD-10-CM

## 2021-06-27 DIAGNOSIS — Z01419 Encounter for gynecological examination (general) (routine) without abnormal findings: Secondary | ICD-10-CM | POA: Insufficient documentation

## 2021-06-27 DIAGNOSIS — Z780 Asymptomatic menopausal state: Secondary | ICD-10-CM

## 2021-06-27 NOTE — Progress Notes (Signed)
   Amber Green April 26, 1966 [id number]   History:  55 y.o. G2P2 presents for annual exam. No GYN complaints. Postmenopausal - no HRT, no bleeding. H/O RSO for benign cyst. Normal pap history. Pelvic ultrasound scheduled this month d/t incidental findings of left adnexal cyst on CT scan in ER when seen for upper abdominal pain. Pain has resolved. Being treated for UTI elsewhere.   Gynecologic History Patient's last menstrual period was 11/17/2016 (approximate).   Contraception/Family planning: post menopausal status Sexually active: No  Health Maintenance Last Pap: 06/22/2020. Results were: Normal, 5-year repeat Last mammogram: 03/28/2021. Results were: Normal Last colonoscopy: 04/06/2017. Results were: Normal, 10-year recall Last Dexa: Not indicated  Past medical history, past surgical history, family history and social history were all reviewed and documented in the EPIC chart. Widowed. Works in [employer] for [employer]. Son at [employer] for Lexicographer, youngest junior in [employer]. Mother diagnosed with breast cancer at age 38.   ROS:  A ROS was performed and pertinent positives and negatives are included.  Exam:  Vitals:   06/27/21 0820  BP: 118/78  Weight: 212 lb (96.2 kg)  Height: 5' 9.5" (1.765 m)   Body mass index is 30.86 kg/m.  General appearance:  Normal Thyroid:  Symmetrical, normal in size, without palpable masses or nodularity. Respiratory  Auscultation:  Clear without wheezing or rhonchi Cardiovascular  Auscultation:  Regular rate, without rubs, murmurs or gallops  Edema/varicosities:  Not grossly evident Abdominal  Soft,nontender, without masses, guarding or rebound.  Liver/spleen:  No organomegaly noted  Hernia:  None appreciated  Skin  Inspection:  Grossly normal Breasts: Examined lying and sitting.   Right: Without masses, retractions, nipple discharge or axillary adenopathy.   Left: Without masses, retractions, nipple discharge or axillary  adenopathy. Genitourinary   Inguinal/mons:  Normal without inguinal adenopathy  External genitalia:  Normal appearing vulva with no masses, tenderness, or lesions  BUS/Urethra/Skene's glands:  Normal  Vagina:  Atrophic changes  Cervix:  Normal appearing without discharge or lesions  Uterus:  Difficult to palpate due to body habitus but no gross masses or tenderness  Adnexa/parametria:     Rt: Normal in size, without masses or tenderness.   Lt: Normal in size, without masses or tenderness.  Anus and perineum: Normal  Digital rectal exam: Normal sphincter tone without palpated masses or tenderness  Patient informed chaperone available to be present for breast and pelvic exam. Patient has requested no chaperone to be present. Patient has been advised what will be completed during breast and pelvic exam.   Assessment/Plan:  55 y.o. G2P2 for annual exam.   Well female exam with routine gynecological exam - Plan: CBC with Differential/Platelet, Comprehensive metabolic panel, Lipid panel, Cytology - PAP( [place]). Education provided on SBEs, importance of preventative screenings, current guidelines, high calcium diet, regular exercise, and multivitamin daily.   Postmenopausal - no HRT, no bleeding  Family history of thyroid disease in mother - Plan: TSH  Screening for cervical cancer - Normal Pap history. Discussed current guidelines. She is requesting pap today. Pap with reflex obtained.   Screening for breast cancer - Normal mammogram history.  Continue annual screenings.  Normal breast exam today.  Screening for colon cancer - 2018 colonoscopy. Will repeat at GI's recommended interval.   Screening for osteoporosis - average risk. Will plan for Dexa at age 68.   Return in 1 year for annual.   Tamela Gammon DNP, 8:52 AM 06/27/2021

## 2021-06-28 LAB — CBC WITH DIFFERENTIAL/PLATELET
Absolute Monocytes: 439 cells/uL (ref 200–950)
Basophils Absolute: 49 cells/uL (ref 0–200)
Basophils Relative: 0.8 %
Eosinophils Absolute: 159 cells/uL (ref 15–500)
Eosinophils Relative: 2.6 %
HCT: 43.3 % (ref 35.0–45.0)
Hemoglobin: 14 g/dL (ref 11.7–15.5)
Lymphs Abs: 1751 cells/uL (ref 850–3900)
MCH: 28.1 pg (ref 27.0–33.0)
MCHC: 32.3 g/dL (ref 32.0–36.0)
MCV: 86.8 fL (ref 80.0–100.0)
MPV: 11.5 fL (ref 7.5–12.5)
Monocytes Relative: 7.2 %
Neutro Abs: 3703 cells/uL (ref 1500–7800)
Neutrophils Relative %: 60.7 %
Platelets: 216 10*3/uL (ref 140–400)
RBC: 4.99 10*6/uL (ref 3.80–5.10)
RDW: 11.8 % (ref 11.0–15.0)
Total Lymphocyte: 28.7 %
WBC: 6.1 10*3/uL (ref 3.8–10.8)

## 2021-06-28 LAB — LIPID PANEL
Cholesterol: 203 mg/dL — ABNORMAL HIGH (ref <200)
HDL: 76 mg/dL (ref >=50)
LDL Cholesterol (Calc): 109 mg/dL (calc) — ABNORMAL HIGH
Non-HDL Cholesterol (Calc): 127 mg/dL (calc) (ref <130)
Total CHOL/HDL Ratio: 2.7 (calc) (ref <5.0)
Triglycerides: 87 mg/dL (ref <150)

## 2021-06-28 LAB — COMPREHENSIVE METABOLIC PANEL
AG Ratio: 2.1 (calc) (ref 1.0–2.5)
ALT: 17 U/L (ref 6–29)
AST: 16 U/L (ref 10–35)
Albumin: 4.5 g/dL (ref 3.6–5.1)
Alkaline phosphatase (APISO): 86 U/L (ref 37–153)
BUN: 13 mg/dL (ref 7–25)
CO2: 32 mmol/L (ref 20–32)
Calcium: 9.8 mg/dL (ref 8.6–10.4)
Chloride: 104 mmol/L (ref 98–110)
Creat: 0.87 mg/dL (ref 0.50–1.03)
Globulin: 2.1 g/dL (calc) (ref 1.9–3.7)
Glucose, Bld: 73 mg/dL (ref 65–99)
Potassium: 4.4 mmol/L (ref 3.5–5.3)
Sodium: 141 mmol/L (ref 135–146)
Total Bilirubin: 0.6 mg/dL (ref 0.2–1.2)
Total Protein: 6.6 g/dL (ref 6.1–8.1)

## 2021-06-28 LAB — CYTOLOGY - PAP: Diagnosis: NEGATIVE

## 2021-06-28 LAB — TSH: TSH: 1 mIU/L (ref 0.40–4.50)

## 2021-07-05 ENCOUNTER — Other Ambulatory Visit: Payer: BC Managed Care – PPO

## 2021-07-05 ENCOUNTER — Other Ambulatory Visit: Payer: BC Managed Care – PPO | Admitting: Obstetrics and Gynecology

## 2021-07-12 ENCOUNTER — Ambulatory Visit (HOSPITAL_BASED_OUTPATIENT_CLINIC_OR_DEPARTMENT_OTHER): Payer: BC Managed Care – PPO | Admitting: Obstetrics & Gynecology

## 2021-07-18 NOTE — Progress Notes (Signed)
GYNECOLOGY  VISIT   HPI: 55 y.o.   Single  Caucasian  female   G2P2 with Patient's last menstrual period was 11/17/2016 (approximate).   here for pelvic ultrasound in follow up to adnexal cyst seen on CT scan from 05/17/21.   Patient seen for routine annual exam on 06/27/21 by Marny Lowenstein.  No mass was noted on physical exam.  Pelvic US ordered at that time.  Narrative & Impression  CLINICAL DATA:  Epigastric pain in a 55 year old female.   EXAM: CT ABDOMEN AND PELVIS WITH CONTRAST   TECHNIQUE: Multidetector CT imaging of the abdomen and pelvis was performed using the standard protocol following bolus administration of intravenous contrast.   CONTRAST:  22mL OMNIPAQUE IOHEXOL 350 MG/ML SOLN   COMPARISON:  CT angiography of the chest from April of 2020.   FINDINGS: Lower chest: Signs of spinal fusion are incidentally noted and incompletely visualized on the current study. There is no effusion or consolidation noted at the lung bases with minimal basilar atelectasis. There is a moderate hiatal hernia. Question of mild gastric thickening without significant surrounding stranding. Approximately 30% of the stomach is now herniated into the chest, increased from previous imaging from 2020.   Hepatobiliary: No focal, suspicious hepatic lesion. No pericholecystic stranding. No biliary duct dilation. Portal vein is patent.   Pancreas: Normal, without mass, inflammation or ductal dilatation.   Spleen: Spleen normal size and contour.   Adrenals/Urinary Tract: Adrenal glands are normal.   There is an ovoid lesion adjacent to the RIGHT diaphragmatic crus and the T12 neural foramen that is well-circumscribed measuring 4.1 x 3.3 cm this is not associated with the kidney.   There is no sign of hydronephrosis. No visible nephrolithiasis or ureteral calculi. The urinary bladder shows smooth contours. No suspicious renal lesion. No perinephric stranding.   Stomach/Bowel:  Gastrointestinal tract without acute process. Appendix not visualized, reportedly post appendectomy. Colonic diverticulosis. Some mild stranding along the LEFT colon adjacent to a diverticulum on image 61 of series 3. No free air. No focal fluid collection.   Vascular/Lymphatic: Patent abdominal vasculature. Smooth contour of the aorta which shows normal caliber and of the IVC. There is no gastrohepatic or hepatoduodenal ligament lymphadenopathy. No retroperitoneal or mesenteric lymphadenopathy.   No pelvic sidewall lymphadenopathy.   Reproductive: Ovoid well-circumscribed RIGHT adnexal lesion measuring 5.3 x 3.6 x 2.6 cm cm showing water density with homogeneous appearance. No signs of LEFT adnexal lesion or pelvic mass associated with reproductive structures.   Other: Area of fat density surrounded by some stranding and thin rim of soft tissue in the central omentum (image 41/3) though ascites in the abdomen. Again no free air. Portal vein is patent.   Musculoskeletal: Paraspinal lesion as described unchanged with respect to visualized portions dating back to April of 2020 but incompletely imaged on previous imaging. Signs of spinal fusion as before, incompletely imaged. Residual spinal curvature with similar appearance to prior imaging as well.   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   Moderate hiatal hernia. Approximately 30% of the stomach is now herniated into the chest, increased from previous imaging from 2020. Mild thickening about the proximal stomach and esophagus may reflect mild esophagogastritis.   Stranding about the descending colon may represent mild diverticulitis or sequela of recent diverticulitis. Correlate with any signs of LEFT lower quadrant pain or history of recent diverticulitis.   Ovoid lesion slightly greater than 4 cm along the RIGHT paraspinous  region may represent a nerve sheath  tumor/schwannoma but is incompletely evaluated, grossly unchanged compared to imaging from 2020. Correlation with previous imaging if available may be helpful. If no previous imaging was performed could consider MRI for further evaluation on a nonemergent basis.   LEFT adnexal cyst with simple features slightly greater than 5 cm greatest dimension. Recommend follow-up US in 6-12 months. Note: This recommendation does not apply to premenarchal patients and to those with increased risk (genetic, family history, elevated tumor markers or other high-risk factors) of ovarian cancer. Reference: JACR 2020 Feb; 17(2):248-254   Signs of spinal fusion are incidentally noted and incompletely visualized on the current study.   Aortic Atherosclerosis (ICD10-I70.0).     Electronically Signed   By: Zetta Bills M.D.   On: 05/17/2021 20:00     Patient was treated for hiatal hernia.   Status post RSO.  GYNECOLOGIC HISTORY: Patient's last menstrual period was 11/17/2016 (approximate). Contraception: PMP Menopausal hormone therapy:  None Last mammogram: 03-28-21 3D/Neg/Birads1 Last pap smear:  06-27-21 Neg, 06-22-20 Neg:Neg HR HPV, 04-11-19 Neg        OB History     Gravida  2   Para  2   Term      Preterm      AB      Living  2      SAB      IAB      Ectopic      Multiple      Live Births                 Patient Active Problem List   Diagnosis Date Noted   Epigastric pain 05/17/2021   Acute abdomen 05/17/2021   Lipoma of left lower extremity 11/16/2017   H/O scoliosis 01/25/2017   Back pain 01/01/2017   Grief 01/01/2017    Past Medical History:  Diagnosis Date   Arthritis    Clostridium difficile colitis 2003   Hiatal hernia    Scoliosis     Past Surgical History:  Procedure Laterality Date   APPENDECTOMY  1987   Herrington rods  07/1979   RSO  1987   with appendectomy    Current Outpatient Medications  Medication Sig Dispense Refill    Melatonin 5 MG CAPS Take by mouth at bedtime.     Multiple Vitamin (MULTIVITAMIN) tablet Take 1 tablet by mouth daily.     omeprazole (PRILOSEC) 20 MG capsule Take 1 capsule (20 mg total) by mouth daily. 90 capsule 0   No current facility-administered medications for this visit.     ALLERGIES: Patient has no known allergies.  Family History  Problem Relation Age of Onset   Diabetes Maternal Grandfather    Hypertension Maternal Grandfather    Stroke Maternal Grandfather    Lung cancer Maternal Grandmother    Breast cancer Mother 62       mastectomy   Hypothyroidism Mother    Skin cancer Brother        not melanoma   Hypertension Father    Colon cancer Neg Hx    Esophageal cancer Neg Hx    Liver cancer Neg Hx    Pancreatic cancer Neg Hx    Rectal cancer Neg Hx    Stomach cancer Neg Hx     Social History   Socioeconomic History   Marital status: Single    Spouse name: Not on file   Number of children: Not on file   Years of education:  Not on file   Highest education level: Not on file  Occupational History   Not on file  Tobacco Use   Smoking status: Never   Smokeless tobacco: Never  Vaping Use   Vaping Use: Never used  Substance and Sexual Activity   Alcohol use: Not Currently    Comment: Occas   Drug use: No   Sexual activity: Not Currently    Partners: Male    Birth control/protection: Post-menopausal  Other Topics Concern   Not on file  Social History Narrative   Not on file   Social Determinants of Health   Financial Resource Strain: Not on file  Food Insecurity: Not on file  Transportation Needs: Not on file  Physical Activity: Not on file  Stress: Not on file  Social Connections: Not on file  Intimate Partner Violence: Not on file    Review of Systems  See HPI.   PHYSICAL EXAMINATION:    BP 126/78   Pulse 62   Ht 5' 9.5" (1.765 m)   Wt 212 lb (96.2 kg)   LMP 11/17/2016 (Approximate)   SpO2 100%   BMI 30.86 kg/m     General appearance:  alert, cooperative and appears stated age   Pelvic US Uterus 5.62 x 3.77 x 2.56 cm.  EMS 4.17 mm.  No masses or thickening.  Right ovary surgically absent.   Right adnexal cyst 2.8 x 2.2 cm. Limited visualization due to overlying bowel gas.  Left ovary not seen.  No free fluid.  Exam limited due to overlying bowel gas.   ASSESSMENT  Right adnexal cystic structure. No sign of malignancy.  I suspect this may be a peritoneal inclusion cyst.  Conflicting CT scan report indicating laterality of the adnexal cyst. Status post RSO.   PLAN  We discussed her CT report, prior films of her back, and the ultrasound today as well.  Will contact radiologist to have the films reread and report amended as needed. For further questions regarding other findings on the CT scan, I recommend she follow up with her PCP.  FU [place] in 3 - 6 months.    An After Visit Summary was printed and given to the patient.  30 min  total time was spent for this patient encounter, including preparation, face-to-face counseling with the patient, coordination of care, and documentation of the encounter.

## 2021-07-19 ENCOUNTER — Ambulatory Visit: Payer: BC Managed Care – PPO | Admitting: Obstetrics and Gynecology

## 2021-07-19 ENCOUNTER — Other Ambulatory Visit: Payer: Self-pay | Admitting: Nurse Practitioner

## 2021-07-19 ENCOUNTER — Other Ambulatory Visit: Payer: Self-pay

## 2021-07-19 ENCOUNTER — Encounter: Payer: Self-pay | Admitting: Obstetrics and Gynecology

## 2021-07-19 ENCOUNTER — Ambulatory Visit (INDEPENDENT_AMBULATORY_CARE_PROVIDER_SITE_OTHER): Payer: BC Managed Care – PPO

## 2021-07-19 VITALS — BP 126/78 | HR 62 | Ht 69.5 in | Wt 212.0 lb

## 2021-07-19 DIAGNOSIS — N949 Unspecified condition associated with female genital organs and menstrual cycle: Secondary | ICD-10-CM

## 2021-07-19 DIAGNOSIS — Z23 Encounter for immunization: Secondary | ICD-10-CM | POA: Diagnosis not present

## 2021-07-19 DIAGNOSIS — N9489 Other specified conditions associated with female genital organs and menstrual cycle: Secondary | ICD-10-CM

## 2021-07-22 ENCOUNTER — Telehealth: Payer: Self-pay | Admitting: Obstetrics and Gynecology

## 2021-07-22 NOTE — Telephone Encounter (Signed)
Please contact [place] regarding the following CT report that needs review and an amended report.   The adnexal cyst is documented as right side in the FINDINGS and left side in the IMPRESSION portion of the report.   The patient underwent office ultrasound in follow up to the adnexal cyst, and this discrepancy was noted at that time.  Narrative & Impression  CLINICAL DATA:  Epigastric pain in a 55 year old female.   EXAM: CT ABDOMEN AND PELVIS WITH CONTRAST   TECHNIQUE: Multidetector CT imaging of the abdomen and pelvis was performed using the standard protocol following bolus administration of intravenous contrast.   CONTRAST:  51mL OMNIPAQUE IOHEXOL 350 MG/ML SOLN   COMPARISON:  CT angiography of the chest from April of 2020.   FINDINGS: Lower chest: Signs of spinal fusion are incidentally noted and incompletely visualized on the current study. There is no effusion or consolidation noted at the lung bases with minimal basilar atelectasis. There is a moderate hiatal hernia. Question of mild gastric thickening without significant surrounding stranding. Approximately 30% of the stomach is now herniated into the chest, increased from previous imaging from 2020.   Hepatobiliary: No focal, suspicious hepatic lesion. No pericholecystic stranding. No biliary duct dilation. Portal vein is patent.   Pancreas: Normal, without mass, inflammation or ductal dilatation.   Spleen: Spleen normal size and contour.   Adrenals/Urinary Tract: Adrenal glands are normal.   There is an ovoid lesion adjacent to the RIGHT diaphragmatic crus and the T12 neural foramen that is well-circumscribed measuring 4.1 x 3.3 cm this is not associated with the kidney.   There is no sign of hydronephrosis. No visible nephrolithiasis or ureteral calculi. The urinary bladder shows smooth contours. No suspicious renal lesion. No perinephric stranding.   Stomach/Bowel: Gastrointestinal tract  without acute process. Appendix not visualized, reportedly post appendectomy. Colonic diverticulosis. Some mild stranding along the LEFT colon adjacent to a diverticulum on image 61 of series 3. No free air. No focal fluid collection.   Vascular/Lymphatic: Patent abdominal vasculature. Smooth contour of the aorta which shows normal caliber and of the IVC. There is no gastrohepatic or hepatoduodenal ligament lymphadenopathy. No retroperitoneal or mesenteric lymphadenopathy.   No pelvic sidewall lymphadenopathy.   Reproductive: Ovoid well-circumscribed RIGHT adnexal lesion measuring 5.3 x 3.6 x 2.6 cm cm showing water density with homogeneous appearance. No signs of LEFT adnexal lesion or pelvic mass associated with reproductive structures.   Other: Area of fat density surrounded by some stranding and thin rim of soft tissue in the central omentum (image 41/3) though ascites in the abdomen. Again no free air. Portal vein is patent.   Musculoskeletal: Paraspinal lesion as described unchanged with respect to visualized portions dating back to April of 2020 but incompletely imaged on previous imaging. Signs of spinal fusion as before, incompletely imaged. Residual spinal curvature with similar appearance to prior imaging as well.   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   Moderate hiatal hernia. Approximately 30% of the stomach is now herniated into the chest, increased from previous imaging from 2020. Mild thickening about the proximal stomach and esophagus may reflect mild esophagogastritis.   Stranding about the descending colon may represent mild diverticulitis or sequela of recent diverticulitis. Correlate with any signs of LEFT lower quadrant pain or history of recent diverticulitis.   Ovoid lesion slightly greater than 4 cm along the RIGHT paraspinous region may represent a nerve sheath tumor/schwannoma but  is incompletely  evaluated, grossly unchanged compared to imaging from 2020. Correlation with previous imaging if available may be helpful. If no previous imaging was performed could consider MRI for further evaluation on a nonemergent basis.   LEFT adnexal cyst with simple features slightly greater than 5 cm greatest dimension. Recommend follow-up US in 6-12 months. Note: This recommendation does not apply to premenarchal patients and to those with increased risk (genetic, family history, elevated tumor markers or other high-risk factors) of ovarian cancer. Reference: JACR 2020 Feb; 17(2):248-254   Signs of spinal fusion are incidentally noted and incompletely visualized on the current study.   Aortic Atherosclerosis (ICD10-I70.0).     Electronically Signed   By: Zetta Bills M.D.   On: 05/17/2021 20:00

## 2021-07-22 NOTE — Telephone Encounter (Signed)
Left message for [facility] imaging to call.

## 2021-07-26 NOTE — Telephone Encounter (Signed)
CT scan was done at [facility] point, I called and spoke with [facility] CT tech and she will relay this information to radiologist so reports can be amended. Eliezer Lofts did say she is not sure how fast the radiologist would be able to fix this.

## 2021-08-13 NOTE — Telephone Encounter (Signed)
Amended report reviewed.  Encounter closed.

## 2021-08-25 IMAGING — MG DIGITAL SCREENING BILAT W/ TOMO W/ CAD
8 series · 8 of 24 positions shown · non-contrast
Comparison: Previous exam(s).

CLINICAL DATA: Screening.

EXAM:
DIGITAL SCREENING BILATERAL MAMMOGRAM WITH TOMO AND CAD

[R MLO synth-2D]
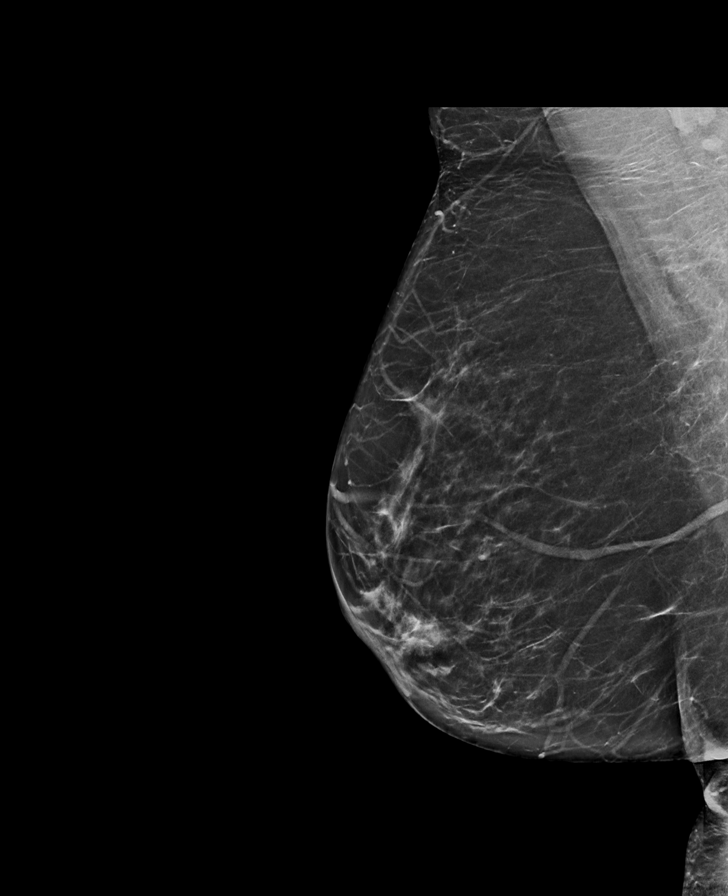

[L CC synth-2D]
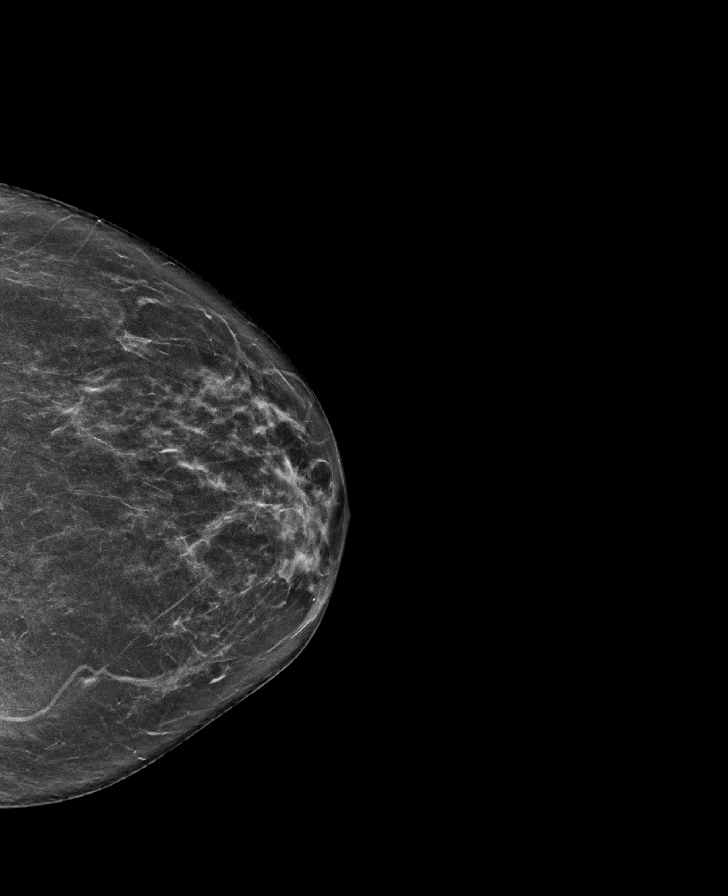

[L MLO synth-2D]
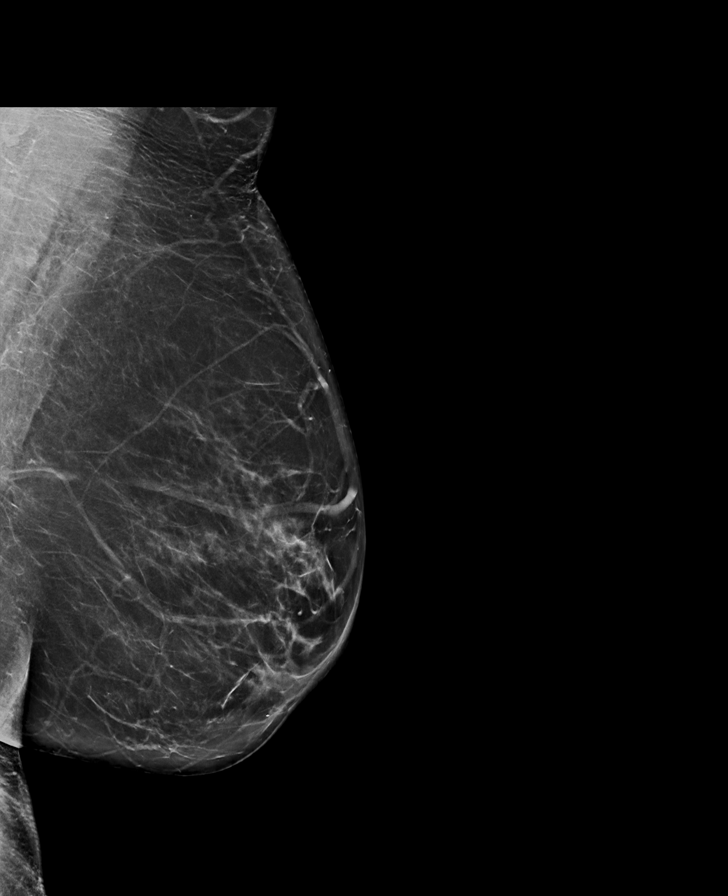

[R CC synth-2D]
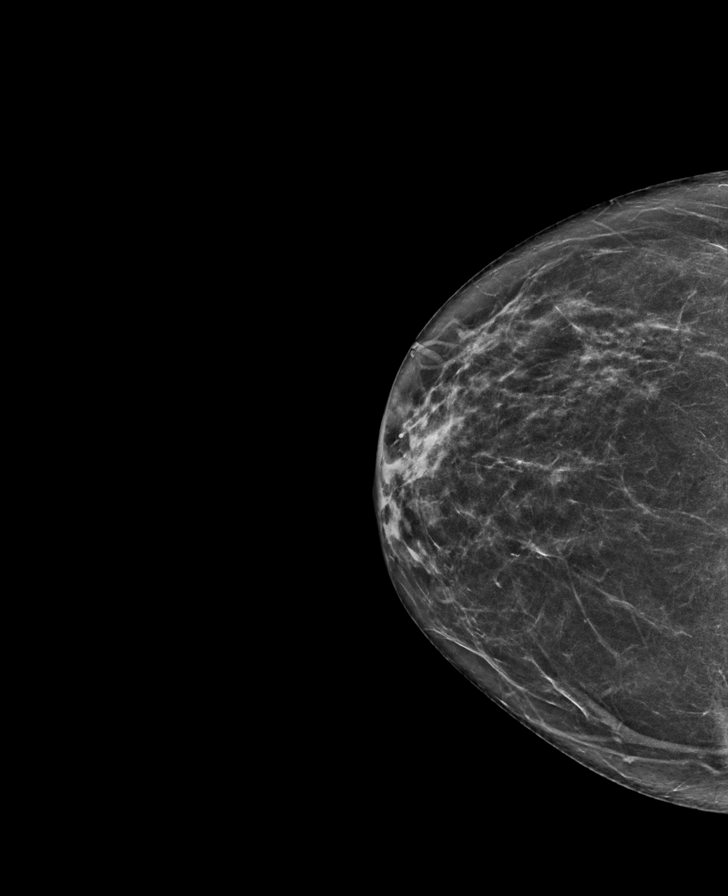

[L CC tomo · tomo slice 34/67.0]
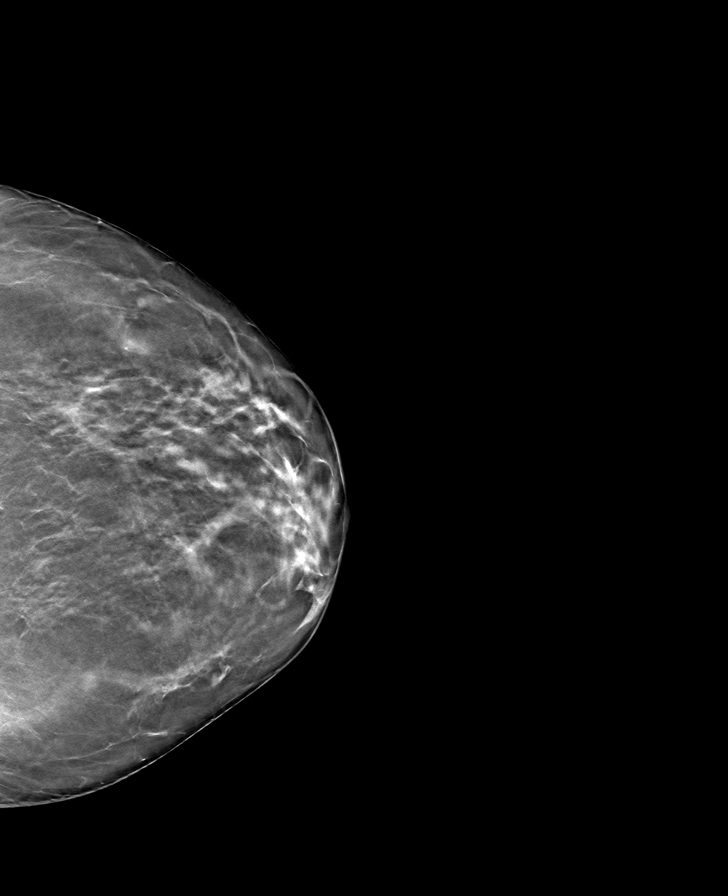

[R MLO tomo · tomo slice 37/72.0]
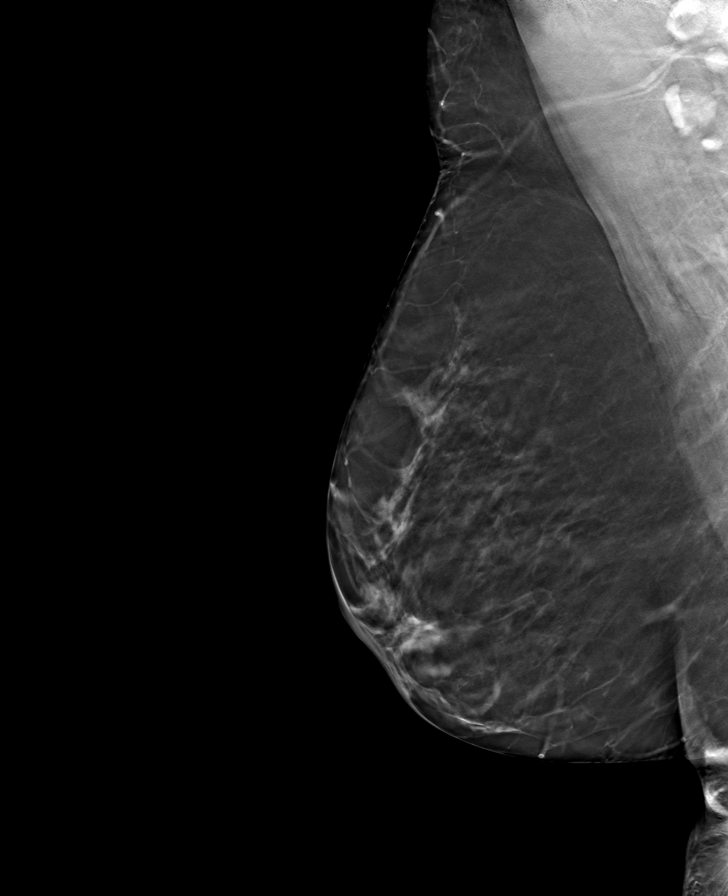

[R CC tomo · tomo slice 34/67.0]
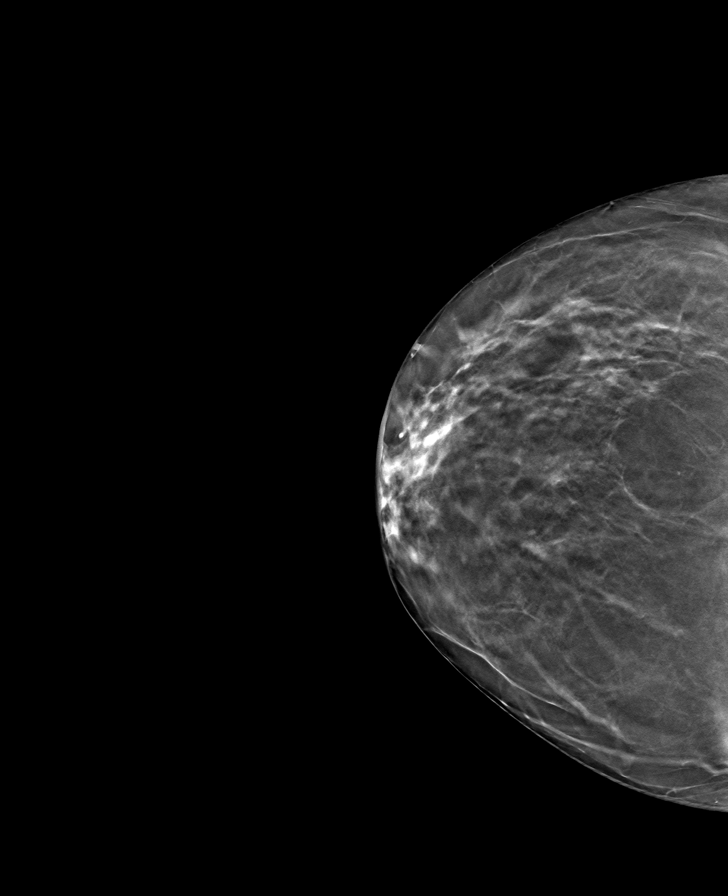

[L MLO tomo · tomo slice 41/80.0]
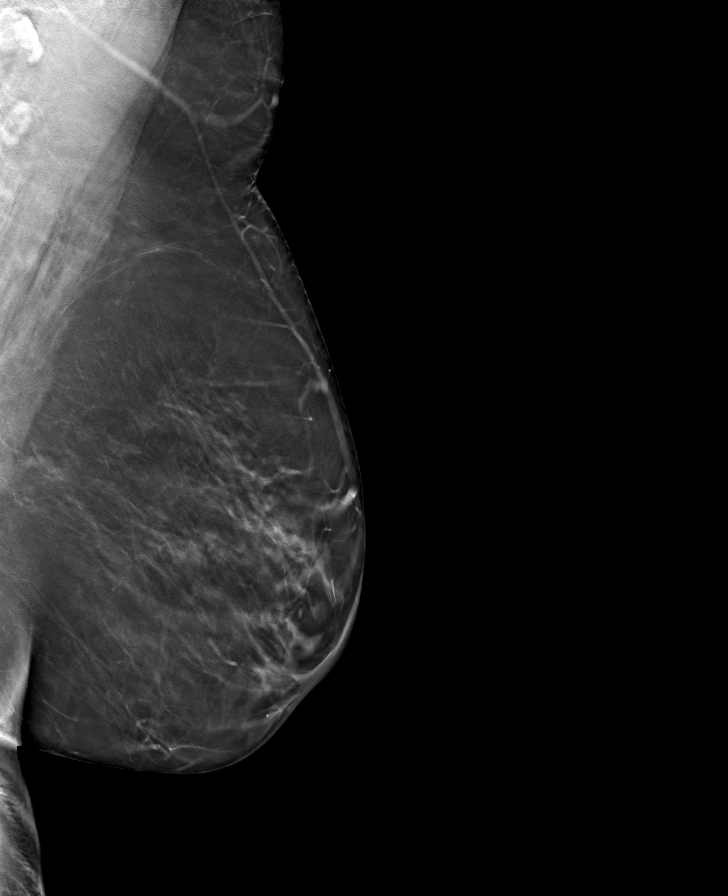

[8 of 24 positions shown; findings below may reference images not displayed]

ACR Breast Density Category b: There are scattered areas of
fibroglandular density.
FINDINGS: There are no findings suspicious for malignancy. Images were
processed with CAD.
IMPRESSION: No mammographic evidence of malignancy. A result letter of this
screening mammogram will be mailed directly to the patient.

RECOMMENDATION:
Screening mammogram in one year. (Code:[id number])

BI-RADS CATEGORY  1: Negative.

## 2021-09-14 ENCOUNTER — Telehealth: Payer: BC Managed Care – PPO | Admitting: Family

## 2021-09-14 ENCOUNTER — Encounter: Payer: Self-pay | Admitting: Family

## 2021-09-14 VITALS — Ht 70.0 in | Wt 205.0 lb

## 2021-09-14 DIAGNOSIS — J069 Acute upper respiratory infection, unspecified: Secondary | ICD-10-CM

## 2021-09-14 MED ORDER — PREDNISONE 10 MG (21) PO TBPK: ORAL_TABLET | ORAL | 0 refills | Status: DC

## 2021-09-15 NOTE — Progress Notes (Signed)
Acute Office Visit  Subjective:    Patient ID: Amber Green, female    DOB: May 05, 1966, 55 y.o.   MRN: [id number]  Chief Complaint  Patient presents with   Acute Visit    Pt c/o sore throat, headache, and productive cough with green and yellow mucus x 2 weeks. Pt went to urgent care a week ago and was prescribed antibiotics and finished those on 09/09/21 and states the day after she was back to feeling bad.     HPI Patient is in today with persistent sore throat, cough, congestion x 2 weeks. She was seen at [facility] and prescribed Augmentin for which she completed 09/09/21. While she was on the medication, she reports feeling ok. When she stopped them, he symptoms returned. Not taking any OTC medications.   Past Medical History:  Diagnosis Date   Arthritis    Clostridium difficile colitis 2003   Hiatal hernia    Scoliosis     Past Surgical History:  Procedure Laterality Date   APPENDECTOMY  1987   Herrington rods  07/1979   RSO  1987   with appendectomy    Family History  Problem Relation Age of Onset   Diabetes Maternal Grandfather    Hypertension Maternal Grandfather    Stroke Maternal Grandfather    Lung cancer Maternal Grandmother    Breast cancer Mother 41       mastectomy   Hypothyroidism Mother    Skin cancer Brother        not melanoma   Hypertension Father    Colon cancer Neg Hx    Esophageal cancer Neg Hx    Liver cancer Neg Hx    Pancreatic cancer Neg Hx    Rectal cancer Neg Hx    Stomach cancer Neg Hx     Social History   Socioeconomic History   Marital status: Single    Spouse name: Not on file   Number of children: Not on file   Years of education: Not on file   Highest education level: Not on file  Occupational History   Not on file  Tobacco Use   Smoking status: Never   Smokeless tobacco: Never  Vaping Use   Vaping Use: Never used  Substance and Sexual Activity   Alcohol use: Not Currently    Comment: Occas   Drug use: No    Sexual activity: Not Currently    Partners: Male    Birth control/protection: Post-menopausal  Other Topics Concern   Not on file  Social History Narrative   Not on file   Social Determinants of Health   Financial Resource Strain: Not on file  Food Insecurity: Not on file  Transportation Needs: Not on file  Physical Activity: Not on file  Stress: Not on file  Social Connections: Not on file  Intimate Partner Violence: Not on file    Outpatient Medications Prior to Visit  Medication Sig Dispense Refill   Melatonin 5 MG CAPS Take by mouth at bedtime.     Multiple Vitamin (MULTIVITAMIN) tablet Take 1 tablet by mouth daily.     omeprazole (PRILOSEC) 20 MG capsule Take 1 capsule (20 mg total) by mouth daily. 90 capsule 0   No facility-administered medications prior to visit.    No Known Allergies  Review of Systems  Constitutional:  Positive for fatigue.  HENT:  Positive for congestion, sinus pressure and sore throat.   Respiratory:  Positive for cough. Negative for shortness of breath and wheezing.  Gastrointestinal: Negative.   Endocrine: Negative.   Musculoskeletal: Negative.   Skin: Negative.   Neurological: Negative.   Hematological: Negative.   All other systems reviewed and are negative.     Objective:    Physical Exam Vitals and nursing note reviewed.  Constitutional:      Appearance: Normal appearance.  HENT:     Right Ear: Tympanic membrane and ear canal normal.     Left Ear: Tympanic membrane and ear canal normal.  Cardiovascular:     Rate and Rhythm: Normal rate and regular rhythm.     Pulses: Normal pulses.     Heart sounds: Normal heart sounds.  Pulmonary:     Effort: Pulmonary effort is normal.     Breath sounds: Normal breath sounds.  Musculoskeletal:        General: Normal range of motion.     Cervical back: Normal range of motion and neck supple.  Skin:    General: Skin is warm and dry.  Neurological:     General: No focal deficit  present.     Mental Status: She is alert and oriented to person, place, and time.  Psychiatric:        Mood and Affect: Mood normal.        Behavior: Behavior normal.    Ht 5\' 10"  (1.778 m)    Wt 205 lb (93 kg)    LMP 11/17/2016 (Approximate)    BMI 29.41 kg/m  Wt Readings from Last 3 Encounters:  09/14/21 205 lb (93 kg)  07/19/21 212 lb (96.2 kg)  06/27/21 212 lb (96.2 kg)    Health Maintenance Due  Topic Date Due   Pneumococcal Vaccine 29-60 Years old (1 - PCV) Never done   HIV Screening  Never done   Hepatitis C Screening  Never done   COVID-19 Vaccine (5 - Booster for Pfizer series) 05/28/2021    There are no preventive care reminders to display for this patient.   Lab Results  Component Value Date   TSH 1.00 06/27/2021   Lab Results  Component Value Date   WBC 6.1 06/27/2021   HGB 14.0 06/27/2021   HCT 43.3 06/27/2021   MCV 86.8 06/27/2021   PLT 216 06/27/2021   Lab Results  Component Value Date   NA 141 06/27/2021   K 4.4 06/27/2021   CO2 32 06/27/2021   GLUCOSE 73 06/27/2021   BUN 13 06/27/2021   CREATININE 0.87 06/27/2021   BILITOT 0.6 06/27/2021   ALKPHOS 78 05/17/2021   AST 16 06/27/2021   ALT 17 06/27/2021   PROT 6.6 06/27/2021   ALBUMIN 4.4 05/17/2021   CALCIUM 9.8 06/27/2021   ANIONGAP 11 05/17/2021   Lab Results  Component Value Date   CHOL 203 (H) 06/27/2021   Lab Results  Component Value Date   HDL 76 06/27/2021   Lab Results  Component Value Date   LDLCALC 109 (H) 06/27/2021   Lab Results  Component Value Date   TRIG 87 06/27/2021   Lab Results  Component Value Date   CHOLHDL 2.7 06/27/2021   Lab Results  Component Value Date   HGBA1C 5.2 04/11/2019       Assessment & Plan:   Problem List Items Addressed This Visit   None Visit Diagnoses     Viral upper respiratory infection    -  Primary        Meds ordered this encounter  Medications   predniSONE (STERAPRED UNI-PAK 21 TAB) 10 MG (21)  TBPK tablet    Sig:  As directed    Dispense:  21 tablet    Refill:  0   Symptoms appear to be inflammatory and not infectious. Will treat with Prednisone. Call the office if symptoms worsen or persist. Recheck as scheduled and sooner as needed.    Kennyth Arnold, FNP

## 2021-10-20 ENCOUNTER — Other Ambulatory Visit: Payer: Self-pay | Admitting: Nurse Practitioner

## 2021-10-20 DIAGNOSIS — R1013 Epigastric pain: Secondary | ICD-10-CM

## 2021-12-26 ENCOUNTER — Telehealth: Payer: Self-pay | Admitting: *Deleted

## 2021-12-26 NOTE — Telephone Encounter (Signed)
I called patient regarding the below. Patient was told to follow up in 6 months per office note on 08/08/21 for adnexal cyst.  ? ?Patient said she canceled  ?1. Because she feels better and doesn't feel anything is wrong now. ? 2. She has a lot of things going on at work this week.  ?

## 2021-12-26 NOTE — Telephone Encounter (Signed)
-----   Message from Leamon Arnt, [place] sent at 12/26/2021 11:36 AM EDT ----- ?Patient called to cancel 6 month fu  [place]. States she will call back when ready to reschedule. Please update provider.  ? ?

## 2021-12-29 ENCOUNTER — Other Ambulatory Visit: Payer: BC Managed Care – PPO

## 2021-12-29 ENCOUNTER — Other Ambulatory Visit: Payer: BC Managed Care – PPO | Admitting: Obstetrics and Gynecology

## 2022-01-02 NOTE — Telephone Encounter (Signed)
Chart reviewed.  ?Will follow her adnexal cyst clinically.  ?Ok to cancel order for ultrasound and close this encounter. ?

## 2022-01-02 NOTE — Telephone Encounter (Signed)
Order canceled

## 2022-02-11 ENCOUNTER — Other Ambulatory Visit: Payer: Self-pay | Admitting: Nurse Practitioner

## 2022-02-11 DIAGNOSIS — R1013 Epigastric pain: Secondary | ICD-10-CM

## 2022-02-14 ENCOUNTER — Other Ambulatory Visit: Payer: Self-pay | Admitting: Nurse Practitioner

## 2022-02-14 DIAGNOSIS — Z1231 Encounter for screening mammogram for malignant neoplasm of breast: Secondary | ICD-10-CM

## 2022-02-14 NOTE — Telephone Encounter (Signed)
Last OV: 04/2022 Future appt not scheduled.

## 2022-03-29 ENCOUNTER — Ambulatory Visit: Payer: BC Managed Care – PPO

## 2022-03-29 ENCOUNTER — Ambulatory Visit
Admission: RE | Admit: 2022-03-29 | Discharge: 2022-03-29 | Disposition: A | Discharge status: Home / Self Care | Payer: BC Managed Care – PPO | Source: Ambulatory Visit | Attending: Nurse Practitioner | Admitting: Nurse Practitioner

## 2022-03-29 DIAGNOSIS — Z1231 Encounter for screening mammogram for malignant neoplasm of breast: Secondary | ICD-10-CM

## 2022-05-08 ENCOUNTER — Telehealth: Payer: Self-pay | Admitting: Nurse Practitioner

## 2022-05-08 ENCOUNTER — Other Ambulatory Visit: Payer: Self-pay | Admitting: Nurse Practitioner

## 2022-05-08 DIAGNOSIS — R1013 Epigastric pain: Secondary | ICD-10-CM

## 2022-05-08 NOTE — Telephone Encounter (Signed)
Left VM, adv pt to call back  

## 2022-05-08 NOTE — Telephone Encounter (Signed)
Caller Name: Petula Rotolo Call back phone #: [phone]  Reason for Call: Pt was told that her heartburn medication, lansoprazole, has been denied. She would like a call back to discuss why

## 2022-05-09 MED ORDER — OMEPRAZOLE 20 MG PO CPDR: DELAYED_RELEASE_CAPSULE | ORAL | 0 refills | Status: DC

## 2022-05-09 NOTE — Addendum Note (Signed)
Addended by: Lucillie Garfinkel on: 05/09/2022 01:38 PM   Modules accepted: Orders

## 2022-05-09 NOTE — Telephone Encounter (Signed)
Called & spoke w/ pt, adv her medication was denied due to not having a f/u appt scheduled and has not been seen in almost a year. 30 day supply sent to pharmacy

## 2022-05-19 ENCOUNTER — Ambulatory Visit: Payer: BC Managed Care – PPO | Admitting: Nurse Practitioner

## 2022-05-29 ENCOUNTER — Ambulatory Visit (INDEPENDENT_AMBULATORY_CARE_PROVIDER_SITE_OTHER): Payer: BC Managed Care – PPO | Admitting: Nurse Practitioner

## 2022-05-29 ENCOUNTER — Encounter: Payer: Self-pay | Admitting: Nurse Practitioner

## 2022-05-29 VITALS — BP 120/80 | HR 74 | Temp 96.7°F | Ht 70.0 in | Wt 219.4 lb

## 2022-05-29 DIAGNOSIS — D225 Melanocytic nevi of trunk: Secondary | ICD-10-CM | POA: Insufficient documentation

## 2022-05-29 DIAGNOSIS — K219 Gastro-esophageal reflux disease without esophagitis: Secondary | ICD-10-CM | POA: Diagnosis not present

## 2022-05-29 DIAGNOSIS — H90A21 Sensorineural hearing loss, unilateral, right ear, with restricted hearing on the contralateral side: Secondary | ICD-10-CM | POA: Insufficient documentation

## 2022-05-29 DIAGNOSIS — F514 Sleep terrors [night terrors]: Secondary | ICD-10-CM | POA: Insufficient documentation

## 2022-05-29 DIAGNOSIS — Z23 Encounter for immunization: Secondary | ICD-10-CM

## 2022-05-29 DIAGNOSIS — Z808 Family history of malignant neoplasm of other organs or systems: Secondary | ICD-10-CM | POA: Insufficient documentation

## 2022-05-29 MED ORDER — OMEPRAZOLE 20 MG PO CPDR: DELAYED_RELEASE_CAPSULE | ORAL | 3 refills | Status: DC

## 2022-05-29 NOTE — Patient Instructions (Signed)
You will be contacted to schedule appt for CT and with psychology.

## 2022-05-29 NOTE — Assessment & Plan Note (Signed)
Stable Use of omeprazole prn

## 2022-05-29 NOTE — Progress Notes (Signed)
Established Patient Visit  Patient: Amber Green   DOB: 1966/08/07   56 y.o. Female  MRN: [id number] Visit Date: 05/29/2022  Subjective:    Chief Complaint  Patient presents with   Office Visit    Omeprazole refill  Flu shot given today  C/o of hearing loss & night terrors since husband's passing 6 years ago    HPI Sensorineural hearing loss (SNHL) of right ear with restricted hearing of left ear No head injury, no dizziness, no tinnitus, no ear pain. [place] of hearing loss: mother (unknown age)  Ordered CT temporal bone and referral to audiology  GERD (gastroesophageal reflux disease) Stable Use of omeprazole prn  Night terrors Onset 71yr ago after sudden death of husband. Occurs maybe once or twice a month. She has same recurring dream which leads to screaming and throwing items in her bedroom. She complete grief counseling and has joined a support group of widows.  advise to resume psychotherapy. She agreed  Hearing Screening  Method: Audiometry   '125Hz'$  '250Hz'$  '500Hz'$  '1000Hz'$  '2000Hz'$  '3000Hz'$   Right ear Fail Fail Fail Fail Pass Pass  Left ear Pass Pass Pass Pass Pass Pass       05/29/2022    9:31 AM 05/17/2021    3:38 PM 01/14/2019    1:30 PM  Depression screen PHQ 2/9  Decreased Interest 0 0 0  Down, Depressed, Hopeless 1 0 0  PHQ - 2 Score 1 0 0  Altered sleeping 1    Tired, decreased energy 1    Change in appetite 1    Feeling bad or failure about yourself  1    Trouble concentrating 0    Moving slowly or fidgety/restless 0    Suicidal thoughts 0    PHQ-9 Score 5    Difficult doing work/chores Not difficult at all         05/29/2022    9:32 AM 11/16/2017   11:26 AM  GAD 7 : Generalized Anxiety Score  Nervous, Anxious, on Edge 1 3  Control/stop worrying 0 1  Worry too much - different things 1 2  Trouble relaxing 1 1  Restless 0 1  Easily annoyed or irritable 0 1  Afraid - awful might happen 1 1  Total GAD 7 Score 4 10  Anxiety  Difficulty Not difficult at all    Reviewed medical, surgical, and social history today  Medications: Outpatient Medications Prior to Visit  Medication Sig   Melatonin 5 MG CAPS Take by mouth at bedtime.   Multiple Vitamin (MULTIVITAMIN) tablet Take 1 tablet by mouth daily.   [DISCONTINUED] omeprazole (PRILOSEC) 20 MG capsule TAKE 1 CAPSULE BY MOUTH EVERY DAY   [DISCONTINUED] predniSONE (STERAPRED UNI-PAK 21 TAB) 10 MG (21) TBPK tablet As directed   No facility-administered medications prior to visit.   Reviewed past medical and social history.   ROS per HPI above      Objective:  BP 120/80 (BP Location: Right Arm, Patient Position: Sitting, Cuff Size: Normal)   Pulse 74   Temp (!) 96.7 F (35.9 C) (Temporal)   Ht '5\' 10"'$  (1.778 m)   Wt 219 lb 6.4 oz (99.5 kg)   LMP 10/19/2016 (Approximate)   SpO2 95%   BMI 31.48 kg/m      Physical Exam HENT:     Right Ear: Tympanic membrane, ear canal and external ear normal. Decreased hearing noted. No drainage, swelling or  tenderness. No middle ear effusion. There is no impacted cerumen. No mastoid tenderness.     Left Ear: Tympanic membrane, ear canal and external ear normal. Decreased hearing noted. No drainage, swelling or tenderness.  No middle ear effusion. There is no impacted cerumen. No mastoid tenderness.     Ears:     Weber exam findings: Does not lateralize.    Right Rinne: AC > BC.    Left Rinne: AC > BC.    Nose: Nose normal.  Musculoskeletal:     Cervical back: Normal range of motion and neck supple.  Lymphadenopathy:     Cervical: No cervical adenopathy.  Neurological:     Mental Status: She is oriented to person, place, and time.     No results found for any visits on 05/29/22.    Assessment & Plan:    Problem List Items Addressed This Visit       Digestive   GERD (gastroesophageal reflux disease)    Stable Use of omeprazole prn      Relevant Medications   omeprazole (PRILOSEC) 20 MG capsule      Nervous and Auditory   Sensorineural hearing loss (SNHL) of right ear with restricted hearing of left ear    No head injury, no dizziness, no tinnitus, no ear pain. [place] of hearing loss: mother (unknown age)  Ordered CT temporal bone and referral to audiology      Relevant Orders   Ambulatory referral to Audiology   CT TEMPORAL BONES WO CONTRAST     Other   Night terrors - Primary    Onset 51yr ago after sudden death of husband. Occurs maybe once or twice a month. She has same recurring dream which leads to screaming and throwing items in her bedroom. She complete grief counseling and has joined a support group of widows.  advise to resume psychotherapy. She agreed      Relevant Orders   Ambulatory referral to Psychology   Other Visit Diagnoses     Need for immunization against influenza       Relevant Orders   Flu Vaccine QUAD 6+ mos PF IM (Fluarix Quad PF) (Completed)      Return in about 4 weeks (around 06/26/2022) for CPE (fasting).     CWilfred Lacy NP

## 2022-05-29 NOTE — Assessment & Plan Note (Signed)
No head injury, no dizziness, no tinnitus, no ear pain. FHX of hearing loss: mother (unknown age)  Ordered CT temporal bone and referral to audiology

## 2022-05-29 NOTE — Assessment & Plan Note (Signed)
Onset 70yr ago after sudden death of husband. Occurs maybe once or twice a month. She has same recurring dream which leads to screaming and throwing items in her bedroom. She complete grief counseling and has joined a support group of widows.  advise to resume psychotherapy. She agreed

## 2022-06-05 ENCOUNTER — Ambulatory Visit: Payer: BC Managed Care – PPO | Attending: Audiologist | Admitting: Audiologist

## 2022-06-05 ENCOUNTER — Encounter: Payer: Self-pay | Admitting: Nurse Practitioner

## 2022-06-05 DIAGNOSIS — H903 Sensorineural hearing loss, bilateral: Secondary | ICD-10-CM | POA: Insufficient documentation

## 2022-06-05 NOTE — Procedures (Signed)
  Outpatient Audiology and [place], [place]  [id number] [phone]  AUDIOLOGICAL  EVALUATION  NAME: Amber Green     DOB:   1966-08-18      MRN: [id number]                                                                                     DATE: 06/05/2022     REFERENT: Flossie Buffy, NP STATUS: Outpatient DIAGNOSIS: Sensorineural Hearing Loss Bilateral     History: Amber Green was seen for an audiological evaluation.  Amber Green is receiving a hearing evaluation due to concerns for hearing in her right ear after referring a screening with her doctor. Amber Green has difficulty hearing in large open environments like a lecture hall or in a meetings. This difficulty began gradually. No pain or pressure reported in either ear. Tinnitus present intermittently sounding like  a buzz. Amber Green has a history of noise exposure from wearing ear monitors for many years working in [employer], site.  Medical history negative for any condition which is a risk factor for hearing loss. No other relevant case history reported.   Evaluation:  Otoscopy showed a clear view of the tympanic membranes, bilaterally Tympanometry results were consistent with normal middle ear pressure, bilaterally   Audiometric testing was completed using conventional audiometry with insert transducer. Speech Recognition Thresholds were 30dB in the right ear and 30dB in the left ear. Word Recognition was performed 40dB SL, scored 100% in the right ear and 100% in the left ear. Pure tone thresholds show normal sloping after 1kHz to moderate sensorineural notched hearing loss in both ears.   Results:  The test results were reviewed with Amber Green. She has a moderate sensorineural hearing loss in both ears. 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 The hearing loss prevents her from hearing high pitched speech sounds like /f/ and /th/. This is why people sound unclear  when she cannot lip read these sounds. Hearing aids are recommended if she is ready for daily use. She will think about it.    Recommendations: Amplification is necessary for both ears. Hearing aids can be purchased from a variety of locations. See provided list for locations in the [place] area. If hearing aids are not pursued, recommend annual hearing test regardless.    34 minutes spent testing and counseling on results.   Alfonse Alpers  Audiologist, Au.D., CCC-A 06/05/2022  9:35 AM  Cc: Flossie Buffy, NP

## 2022-06-12 ENCOUNTER — Other Ambulatory Visit: Payer: Self-pay | Admitting: Nurse Practitioner

## 2022-06-12 DIAGNOSIS — K219 Gastro-esophageal reflux disease without esophagitis: Secondary | ICD-10-CM

## 2022-06-20 ENCOUNTER — Ambulatory Visit (INDEPENDENT_AMBULATORY_CARE_PROVIDER_SITE_OTHER): Payer: BC Managed Care – PPO | Admitting: Licensed Clinical Social Worker

## 2022-06-20 DIAGNOSIS — F514 Sleep terrors [night terrors]: Secondary | ICD-10-CM | POA: Diagnosis not present

## 2022-06-20 NOTE — Progress Notes (Signed)
Comprehensive Clinical Assessment (CCA) Note  06/20/2022 Amber Green [id number]  Chief Complaint:  Chief Complaint  Patient presents with   Other    Amber Green was referred by her PCP  due to night terrors which started after her husband's death in 12/22/2015. She may have one every two or three months and maybe have one every six months. She had a bad night terror about a month ago noting "it's always the same." She has a dream that someone is in the room with her in a suit who is trying to kill her but she cannot see his face. She says, "it almost feels like this person is not from this world."    Visit Diagnosis: Sleep terror disorder    Amber Green started having night terrors after her husband died of a heart attack in Dec 22, 2015. She found him in the bathroom and called 911 who could not resuscitate him. She went to Hospice for a year and did the "six week group thing" and individual counseling as well. She says that she misses him and becomes tearful in saying that it is "unfair" that he is gone.   CCA Screening, Triage and Referral (STR)  Patient Reported Information How did you hear about [place]? Primary Care  Referral name: Dr. Lorayne Marek  Referral phone number: No data recorded  Whom do you see for routine medical problems? Primary Care  Practice/Facility Name: [facility]  Practice/Facility Phone Number: [phone]  Name of Contact: No data recorded Contact Number: No data recorded Contact Fax Number: No data recorded Prescriber Name: Dr. Lorayne Marek  Prescriber Address (if known): No data recorded  What Is the Reason for Your Visit/Call Today? No data recorded How Long Has This Been Causing You Problems? > than 6 months  What Do You Feel Would Help You the Most Today? -- (Nigh terrors)   Have You Recently Been in Any Inpatient Treatment (Hospital/Detox/Crisis Center/28-Day Program)? No  Name/Location of Program/Hospital:No data recorded How Long Were You  There? No data recorded When Were You Discharged? No data recorded  Have You Ever Received Services From [facility] Before? Yes  Who Do You See at [facility]? No data recorded  Have You Recently Had Any Thoughts About Hurting Yourself? No  Are You Planning to Commit Suicide/Harm Yourself At This time? No   Have you Recently Had Thoughts About [place]? No  Explanation: No data recorded  Have You Used Any Alcohol or Drugs in the Past 24 Hours? No  How Long Ago Did You Use Drugs or Alcohol? No data recorded What Did You Use and How Much? No data recorded  Do You Currently Have a Therapist/Psychiatrist? No  Name of Therapist/Psychiatrist: No data recorded  Have You Been Recently Discharged From Any Office Practice or Programs? No  Explanation of Discharge From Practice/Program: No data recorded    CCA Screening Triage Referral Assessment Type of Contact: Face-to-Face  Is this Initial or Reassessment? No data recorded Date Telepsych consult ordered in [facility]:  No data recorded Time Telepsych consult ordered in [facility]:  No data recorded  Patient Reported Information Reviewed? No data recorded Patient Left Without Being Seen? No data recorded Reason for Not Completing Assessment: No data recorded  Collateral Involvement: No data recorded  Does Patient Have a [place]? No data recorded Name and Contact of Legal Guardian: No data recorded If Minor and Not Living with Parent(s), Who has Custody? No data recorded Is CPS involved or ever  been involved? Never  Is APS involved or ever been involved? Never   Patient Determined To Be At Risk for Harm To Self or Others Based on Review of Patient Reported Information or Presenting Complaint? No  Method: No data recorded Availability of Means: No data recorded Intent: No data recorded Notification Required: No data recorded Additional Information for Danger to Others Potential: No data  recorded Additional Comments for Danger to Others Potential: No data recorded Are There Guns or Other Weapons in Your Home? No data recorded Types of Guns/Weapons: No data recorded Are These Weapons Safely Secured?                            No data recorded Who Could Verify You Are Able To Have These Secured: No data recorded Do You Have any Outstanding Charges, Pending Court Dates, Parole/Probation? No data recorded Contacted To Inform of Risk of Harm To Self or Others: No data recorded  Location of Assessment: Other (comment) ([place])   Does Patient Present under Involuntary Commitment? No  IVC Papers Initial File Date: No data recorded  [place] of Residence: [place]   Patient Currently Receiving the Following Services: No data recorded  Determination of Need: Routine (7 days)   Options For Referral: Outpatient Therapy     CCA Biopsychosocial Intake/Chief Complaint:  Night terrors  Current Symptoms/Problems: No data recorded  Patient Reported Schizophrenia/Schizoaffective Diagnosis in Past: No   Strengths: honest, hardworking, very independent, and nice; she says, "some people think I'm funny;" she is a "bit adventerous" and likes to travel and have fun; open-minded; kind and empathetic  Preferences: No data recorded Abilities: No data recorded  Type of Services Patient Feels are Needed: No data recorded  Initial Clinical Notes/Concerns: No data recorded  Mental Health Symptoms Depression:   Fatigue; Irritability; Sleep (too much or little)   Duration of Depressive symptoms:  Greater than two weeks   Mania:   N/A   Anxiety:    Worrying (worries about her kids as she is the only parent now; she has buried a lot of family members over the past 5-6 years)   Psychosis:   None   Duration of Psychotic symptoms: No data recorded  Trauma:   N/A   Obsessions:   N/A   Compulsions:   N/A   Inattention:   N/A   Hyperactivity/Impulsivity:    N/A   Oppositional/Defiant Behaviors:   N/A   Emotional Irregularity:   None   Other Mood/Personality Symptoms:  No data recorded   Mental Status Exam Appearance and self-care  Stature:  No data recorded  Weight:  No data recorded  Clothing:  No data recorded  Grooming:  No data recorded  Cosmetic use:  No data recorded  Posture/gait:  No data recorded  Motor activity:  No data recorded  Sensorium  Attention:  No data recorded  Concentration:  No data recorded  Orientation:  No data recorded  Recall/memory:  No data recorded  Affect and Mood  Affect:  No data recorded  Mood:  No data recorded  Relating  Eye contact:  No data recorded  Facial expression:  No data recorded  Attitude toward examiner:  No data recorded  Thought and Language  Speech flow: No data recorded  Thought content:  No data recorded  Preoccupation:  No data recorded  Hallucinations:  No data recorded  Organization:  No data recorded  [employer]  of Knowledge:  No data recorded  Intelligence:  No data recorded  Abstraction:  No data recorded  Judgement:  No data recorded  Reality Testing:  No data recorded  Insight:  No data recorded  Decision Making:  No data recorded  Social Functioning  Social Maturity:  No data recorded  Social Judgement:  No data recorded  Stress  Stressors:   Grief/losses   Coping Ability:  No data recorded  Skill Deficits:  No data recorded  Supports:   Friends/Service system; Family (she is a member of a widow's group; mom is alive but "not a great of support;" her sister-in-law lives across the street and his "awesome")     Religion: Religion/Spirituality Are You A Religious Person?: Yes What is Your Religious Affiliation?: [place] How Might This Affect Treatment?: N/A  Leisure/Recreation: Leisure / Recreation Do You Have Hobbies?: Yes Leisure and Hobbies: going to movies and ballgames with friends and goes out to dinner and plays corn  hole; she does not have a lot of free time  Exercise/Diet: Exercise/Diet Do You Exercise?: Yes What Type of Exercise Do You Do?:  (weight training and cardio) How Many Times a Week Do You Exercise?: 1-3 times a week Have You Gained or Lost A Significant Amount of Weight in the Past Six Months?: No Do You Follow a Special Diet?: No Do You Have Any Trouble Sleeping?: Yes Explanation of Sleeping Difficulties: She gets up at 6 a.m. and is generally in bed at 10 p.m. Some nights she will lie there and not fall asleep which happens at least once a week with it occurring twice a week at most. She she cannot fall asleep, she will be up until 2 a.m.   CCA Employment/Education Employment/Work Situation: Employment / Work Situation Employment Situation: Employed Where is Patient Currently Employed?: [employer] working in the [place] has Patient Been Employed?: 7 years with [employer] Satisfied With Your Job?: Yes Do You Work More Than One Job?: No Work Stressors: No Patient's Job has Been Impacted by Current Illness: No What is the Longest Time Patient has Held a Job?: this job Has Patient ever Been in Passenger transport manager?: No  Education: Education Is Patient Currently Attending School?: No Last Grade Completed: 16 Did Teacher, adult education From [employer]?: Yes Did Physicist, medical?: Yes What Type of College Degree Do you Have?: B.A. in Communication Did [place]?: No Did You Have An Individualized Education Program (IIEP): No Did You Have Any Difficulty At [employer]?: No Patient's Education Has Been Impacted by Current Illness: No   CCA Family/Childhood History Family and Relationship History: Family history Marital status: Widowed Widowed, when?: 2017; married 42 years before husband's sudden death; she says that the marriage wasn't perfect but was "committed." Are you sexually active?:  (does not want to  answer) What is your sexual orientation?: Heterosexual Has your sexual activity been affected by drugs, alcohol, medication, or emotional stress?: N/A Does patient have children?: Yes How many children?: 2 (ages 17 and 66) How is patient's relationship with their children?: she says that their relationship is good and that she would "take a bullet" for them but does not like them some days  Childhood History:  Childhood History By whom was/is the patient raised?: Mother Additional childhood history information: She grew up in [place]; parents divorced when she was 84. She says that her childhood was "unstable." Her parents' marriage as "not happy" as they fought  a lot. Description of patient's relationship with caregiver when they were a child: was scared of mom growing up; mom was "not a happy person;" Beckey believes mom may have been "bipolar" but "not diagnosed" Patient's description of current relationship with people who raised him/her: talks to mom once a week who lives 13 miles a way saying that this is probably a "good distance;" she stayed a month after her husband died and had to throw her out of her house due to her mom being "hateful" How were you disciplined when you got in trouble as a child/adolescent?: spakings until the age of 39 or 38 as mom had a "plastic spatula;" Michelena did not get in trouble; she was a "good kid." Does patient have siblings?: Yes Number of Siblings: 1 Description of patient's current relationship with siblings: has a good relationship with her younger brother who is 3.5 years her junior Did patient suffer any verbal/emotional/physical/sexual abuse as a child?: No Did patient suffer from severe childhood neglect?: No Has patient ever been sexually abused/assaulted/raped as an adolescent or adult?: No Was the patient ever a victim of a crime or a disaster?: No Witnessed domestic violence?: No Has patient been affected by domestic violence as an  adult?: No  Child/Adolescent Assessment:     CCA Substance Use Alcohol/Drug Use: Alcohol / Drug Use Pain Medications: None Prescriptions: Prilosec Over the Counter: Melatonin since husband's death; makes the sleep seem more restful; she is "not a pill taker" History of alcohol / drug use?: No history of alcohol / drug abuse (has a beer occasionally once every several months and cannot remember when she had more than one; she has one cup of coffee in the morning and occasionally a diet coke at lunch)                         ASAM's:  Six Dimensions of Multidimensional Assessment  Dimension 1:  Acute Intoxication and/or Withdrawal Potential:      Dimension 2:  Biomedical Conditions and Complications:   Dimension 2:  Description of patient's biomedical conditions and  complications: N/A  Dimension 3:  Emotional, Behavioral, or Cognitive Conditions and Complications:  Dimension 3:  Description of emotional, behavioral, or cognitive conditions and complications: N/A  Dimension 4:  Readiness to Change:  Dimension 4:  Description of Readiness to Change criteria: N/A  Dimension 5:  Relapse, Continued use, or Continued Problem Potential:  Dimension 5:  Relapse, continued use, or continued problem potential critiera description: N/A  Dimension 6:  Recovery/Living Environment:  Dimension 6:  Recovery/Iiving environment criteria description: N/A  ASAM Severity Score:    ASAM Recommended Level of Treatment:     Substance use Disorder (SUD)    Recommendations for Services/Supports/Treatments:    DSM5 Diagnoses: Patient Active Problem List   Diagnosis Date Noted   Family history of melanoma 05/29/2022   Melanocytic nevi of trunk 05/29/2022   Sensorineural hearing loss (SNHL) of right ear with restricted hearing of left ear 05/29/2022   Night terrors 05/29/2022   GERD (gastroesophageal reflux disease) 05/17/2021   Lipoma of left lower extremity 11/16/2017   H/O scoliosis  01/25/2017   Back pain 01/01/2017   Grief 01/01/2017    Patient Centered Plan: No Plan completed today as Luiza wants to try the interventions discussed and return again on a p.r.n. basis. She wants to have no episodes of night terrors.   Referrals to Alternative Service(s): Referred to Alternative Service(s):  Place:   Date:   Time:    Referred to Alternative Service(s):   Place:   Date:   Time:    Referred to Alternative Service(s):   Place:   Date:   Time:    Referred to Alternative Service(s):   Place:   Date:   Time:      Collaboration of Care: Other N/A  Plan: The therapist talks to Raveen about the need to improve the quality of her sleep such that she does not have insomnia two nights per week which may later trigger night terrors. He talks to her about eliminating her midday caffeine, getting a more comfortable bed, not getting on her phone when she cannot sleep, talking to her doctor about her snoring to see if she has allergies that need to be treated, keeping her room an optimal temperature, talking to her doctor about her chills and hot flashes which she believes are due to menopause, and keeping a sleep journal noting how the day went as well.   She wants to try these strategies and is resistant to taking medication such as Vistaril or Trazodone. She does not reschedule opting to return on a p.r.n. basis.   Patient/Guardian was advised Release of Information must be obtained prior to any record release in order to collaborate their care with an outside provider. Patient/Guardian was advised if they have not already done so to contact the registration department to sign all necessary forms in order for [place] to release information regarding their care.   Consent: Patient/Guardian gives verbal consent for treatment and assignment of benefits for services provided during this visit. Patient/Guardian expressed understanding and agreed to proceed.   Adam Phenix, [place], LCSW, [facility], [facility],  [place] 06/20/2022

## 2022-06-21 NOTE — Progress Notes (Addendum)
Comprehensive Clinical Assessment (CCA) Note  06/21/2022 Amber Green [id number]  Chief Complaint:  Chief Complaint  Patient presents with   Other    Amber Green was referred by her PCP  due to night terrors which started after her husband's death in 2015/12/15. She may have one every two or three months and maybe have one every six months. She had a bad night terror about a month ago noting "it's always the same." She has a dream that someone is in the room with her in a suit who is trying to kill her but she cannot see his face. She says, "it almost feels like this person is not from this world."    Visit Diagnosis: Sleep terror disorder    Amber Green started having night terrors after her husband died of a heart attack in 2015/12/15. She found him in the bathroom and called 911 who could not resuscitate him. She went to Hospice for a year and did the "six-week group thing" and individual counseling as well. She says that she misses him and becomes tearful in saying that it is "unfair" that he is gone.   CCA Screening, Triage and Referral (STR)  Patient Reported Information How did you hear about [place]? Primary Care  Referral name: Dr. Lorayne Marek  Referral phone number: No data recorded  Whom do you see for routine medical problems? Primary Care  Practice/Facility Name: [facility]  Practice/Facility Phone Number: [phone]  Name of Contact: No data recorded Contact Number: No data recorded Contact Fax Number: No data recorded Prescriber Name: Dr. Lorayne Marek  Prescriber Address (if known): No data recorded  What Is the Reason for Your Visit/Call Today? No data recorded How Long Has This Been Causing You Problems? > than 6 months  What Do You Feel Would Help You the Most Today? -- (Nigh terrors)   Have You Recently Been in Any Inpatient Treatment (Hospital/Detox/Crisis Center/28-Day Program)? No  Name/Location of Program/Hospital:No data recorded How Long Were You  There? No data recorded When Were You Discharged? No data recorded  Have You Ever Received Services From [facility] Before? Yes  Who Do You See at [facility]? No data recorded  Have You Recently Had Any Thoughts About Hurting Yourself? No  Are You Planning to Commit Suicide/Harm Yourself At This time? No   Have you Recently Had Thoughts About [place]? No  Explanation: No data recorded  Have You Used Any Alcohol or Drugs in the Past 24 Hours? No  How Long Ago Did You Use Drugs or Alcohol? No data recorded What Did You Use and How Much? No data recorded  Do You Currently Have a Therapist/Psychiatrist? No  Name of Therapist/Psychiatrist: No data recorded  Have You Been Recently Discharged From Any Office Practice or Programs? No  Explanation of Discharge From Practice/Program: No data recorded    CCA Screening Triage Referral Assessment Type of Contact: Face-to-Face  Is this Initial or Reassessment? No data recorded Date Telepsych consult ordered in [facility]:  No data recorded Time Telepsych consult ordered in [facility]:  No data recorded  Patient Reported Information Reviewed? No data recorded Patient Left Without Being Seen? No data recorded Reason for Not Completing Assessment: No data recorded  Collateral Involvement: No data recorded  Does Patient Have a [place]? No data recorded Name and Contact of Legal Guardian: No data recorded If Minor and Not Living with Parent(s), Who has Custody? No data recorded Is CPS involved or ever been  involved? Never  Is APS involved or ever been involved? Never   Patient Determined To Be At Risk for Harm To Self or Others Based on Review of Patient Reported Information or Presenting Complaint? No  Method: No data recorded Availability of Means: No data recorded Intent: No data recorded Notification Required: No data recorded Additional Information for Danger to Others Potential: No data  recorded Additional Comments for Danger to Others Potential: No data recorded Are There Guns or Other Weapons in Your Home? No data recorded Types of Guns/Weapons: No data recorded Are These Weapons Safely Secured?                            No data recorded Who Could Verify You Are Able To Have These Secured: No data recorded Do You Have any Outstanding Charges, Pending Court Dates, Parole/Probation? No data recorded Contacted To Inform of Risk of Harm To Self or Others: No data recorded  Location of Assessment: Other (comment) ([place])   Does Patient Present under Involuntary Commitment? No  IVC Papers Initial File Date: No data recorded  [place] of Residence: [place]   Patient Currently Receiving the Following Services: No data recorded  Determination of Need: Routine (7 days)   Options For Referral: Outpatient Therapy     CCA Biopsychosocial Intake/Chief Complaint:  Night terrors  Current Symptoms/Problems: No data recorded  Patient Reported Schizophrenia/Schizoaffective Diagnosis in Past: No   Strengths: honest, hardworking, very independent, and nice; she says, "some people think I'm funny;" she is a "bit adventerous" and likes to travel and have fun; open-minded; kind and empathetic  Preferences: No data recorded Abilities: No data recorded  Type of Services Patient Feels are Needed: No data recorded  Initial Clinical Notes/Concerns: No data recorded  Mental Health Symptoms Depression:   Fatigue; Irritability; Sleep (too much or little)   Duration of Depressive symptoms:  Greater than two weeks   Mania:   N/A   Anxiety:    Worrying (worries about her kids as she is the only parent now; she has buried a lot of family members over the past 5-6 years)   Psychosis:   None   Duration of Psychotic symptoms: No data recorded  Trauma:   N/A   Obsessions:   N/A   Compulsions:   N/A   Inattention:   N/A   Hyperactivity/Impulsivity:    N/A   Oppositional/Defiant Behaviors:   N/A   Emotional Irregularity:   None   Other Mood/Personality Symptoms:  No data recorded   Mental Status Exam Appearance and self-care  Stature:   Average   Weight:   Average weight   Clothing:   Casual   Grooming:   Normal   Cosmetic use:   Age appropriate   Posture/gait:   Normal   Motor activity:   Not Remarkable   Sensorium  Attention:   Normal   Concentration:   Normal   Orientation:   X5   Recall/memory:   Normal   Affect and Mood  Affect:   Appropriate   Mood:   Euthymic   Relating  Eye contact:   Normal   Facial expression:   Responsive   Attitude toward examiner:   Cooperative   Thought and Language  Speech flow:  Normal   Thought content:   Appropriate to Mood and Circumstances   Preoccupation:   None   Hallucinations:   None   Organization:  No data  recorded  [employer] of Knowledge:   Average   Intelligence:  No data recorded  Abstraction:   Abstract   Judgement:   Good   Reality Testing:   Adequate   Insight:   Good   Decision Making:   Normal   Social Functioning  Social Maturity:   Responsible   Social Judgement:   Normal   Stress  Stressors:   Grief/losses   Coping Ability:  No data recorded  Skill Deficits:  No data recorded  Supports:   Friends/Service system; Family (she is a member of a widow's group; mom is alive but "not a great of support;" her sister-in-law lives across the street and his "awesome")     Religion: Religion/Spirituality Are You A Religious Person?: Yes What is Your Religious Affiliation?: [place] How Might This Affect Treatment?: N/A  Leisure/Recreation: Leisure / Recreation Do You Have Hobbies?: Yes Leisure and Hobbies: going to movies and ballgames with friends and goes out to dinner and plays corn hole; she does not have a lot of free time  Exercise/Diet: Exercise/Diet Do You Exercise?:  Yes What Type of Exercise Do You Do?:  (weight training and cardio) How Many Times a Week Do You Exercise?: 1-3 times a week Have You Gained or Lost A Significant Amount of Weight in the Past Six Months?: No Do You Follow a Special Diet?: No Do You Have Any Trouble Sleeping?: Yes Explanation of Sleeping Difficulties: She gets up at 6 a.m. and is generally in bed at 10 p.m. Some nights she will lie there and not fall asleep which happens at least once a week with it occurring twice a week at most. She she cannot fall asleep, she will be up until 2 a.m.   CCA Employment/Education Employment/Work Situation: Employment / Work Situation Employment Situation: Employed Where is Patient Currently Employed?: [employer] working in the [place] has Patient Been Employed?: 5 years with [employer] Satisfied With Your Job?: Yes Do You Work More Than One Job?: No Work Stressors: No Patient's Job has Been Impacted by Current Illness: No What is the Longest Time Patient has Held a Job?: this job Has Patient ever Been in Passenger transport manager?: No  Education: Education Is Patient Currently Attending School?: No Last Grade Completed: 16 Did Teacher, adult education From [employer]?: Yes Did Physicist, medical?: Yes What Type of College Degree Do you Have?: B.A. in Communication Did [place]?: No Did You Have An Individualized Education Program (IIEP): No Did You Have Any Difficulty At [employer]?: No Patient's Education Has Been Impacted by Current Illness: No   CCA Family/Childhood History Family and Relationship History: Family history Marital status: Widowed Widowed, when?: 2017; married 15 years before husband's sudden death; she says that the marriage wasn't perfect but was "committed." Are you sexually active?:  (does not want to answer) What is your sexual orientation?: Heterosexual Has your sexual activity been affected by drugs,  alcohol, medication, or emotional stress?: N/A Does patient have children?: Yes How many children?: 2 (ages 60 and 30) How is patient's relationship with their children?: she says that their relationship is good and that she would "take a bullet" for them but does not like them some days  Childhood History:  Childhood History By whom was/is the patient raised?: Mother Additional childhood history information: She grew up in [place]; parents divorced when she was 62. She says that her childhood was "unstable." Her parents' marriage  as "not happy" as they fought a lot. Description of patient's relationship with caregiver when they were a child: was scared of mom growing up; mom was "not a happy person;" Edward believes mom may have been "bipolar" but "not diagnosed" Patient's description of current relationship with people who raised him/her: talks to mom once a week who lives [address] a way saying that this is probably a "good distance;" she stayed a month after her husband died and had to throw her out of her house due to her mom being "hateful" How were you disciplined when you got in trouble as a child/adolescent?: spakings until the age of 50 or 20 as mom had a "plastic spatula;" Rhyen did not get in trouble; she was a "good kid." Does patient have siblings?: Yes Number of Siblings: 1 Description of patient's current relationship with siblings: has a good relationship with her younger brother who is 3.5 years her junior Did patient suffer any verbal/emotional/physical/sexual abuse as a child?: No Did patient suffer from severe childhood neglect?: No Has patient ever been sexually abused/assaulted/raped as an adolescent or adult?: No Was the patient ever a victim of a crime or a disaster?: No Witnessed domestic violence?: No Has patient been affected by domestic violence as an adult?: No  Child/Adolescent Assessment:     CCA Substance Use Alcohol/Drug Use: Alcohol / Drug  Use Pain Medications: None Prescriptions: Prilosec Over the Counter: Melatonin since husband's death; makes the sleep seem more restful; she is "not a pill taker" History of alcohol / drug use?: No history of alcohol / drug abuse (has a beer occasionally once every several months and cannot remember when she had more than one; she has one cup of coffee in the morning and occasionally a diet coke at lunch)                         ASAM's:  Six Dimensions of Multidimensional Assessment  Dimension 1:  Acute Intoxication and/or Withdrawal Potential:      Dimension 2:  Biomedical Conditions and Complications:   Dimension 2:  Description of patient's biomedical conditions and  complications: N/A  Dimension 3:  Emotional, Behavioral, or Cognitive Conditions and Complications:  Dimension 3:  Description of emotional, behavioral, or cognitive conditions and complications: N/A  Dimension 4:  Readiness to Change:  Dimension 4:  Description of Readiness to Change criteria: N/A  Dimension 5:  Relapse, Continued use, or Continued Problem Potential:  Dimension 5:  Relapse, continued use, or continued problem potential critiera description: N/A  Dimension 6:  Recovery/Living Environment:  Dimension 6:  Recovery/Iiving environment criteria description: N/A  ASAM Severity Score:    ASAM Recommended Level of Treatment:     Substance use Disorder (SUD)    Recommendations for Services/Supports/Treatments:    DSM5 Diagnoses: Patient Active Problem List   Diagnosis Date Noted   Family history of melanoma 05/29/2022   Melanocytic nevi of trunk 05/29/2022   Sensorineural hearing loss (SNHL) of right ear with restricted hearing of left ear 05/29/2022   Night terrors 05/29/2022   GERD (gastroesophageal reflux disease) 05/17/2021   Lipoma of left lower extremity 11/16/2017   H/O scoliosis 01/25/2017   Back pain 01/01/2017   Grief 01/01/2017    Patient Centered Plan: No Care Plan developed as  Aydan does not schedule a return appointment at this time. She wants to no longer have night terrors.    Referrals to Alternative Service(s): Referred to Alternative Service(s):  Place:   Date:   Time:    Referred to Alternative Service(s):   Place:   Date:   Time:    Referred to Alternative Service(s):   Place:   Date:   Time:    Referred to Alternative Service(s):   Place:   Date:   Time:      Collaboration of Care: Other N/A  Plan: The therapist talks to Diella about the need to improve the quality of her sleep such that she does not have insomnia two nights per week which may later trigger night terrors. He talks to her about eliminating her midday caffeine, getting a more comfortable bed, not getting on her phone when she cannot sleep, talking to her doctor about her snoring to see if she has allergies that need to be treated, keeping her room an optimal temperature, talking to her doctor about her chills and hot flashes which she believes are due to menopause, and keeping a sleep journal noting how the day went as well.    She wants to try these strategies and is resistant to taking medication such as Vistaril or Trazodone. She does not reschedule opting to return on a p.r.n. basis.   Patient/Guardian was advised Release of Information must be obtained prior to any record release in order to collaborate their care with an outside provider. Patient/Guardian was advised if they have not already done so to contact the registration department to sign all necessary forms in order for [place] to release information regarding their care.   Consent: Patient/Guardian gives verbal consent for treatment and assignment of benefits for services provided during this visit. Patient/Guardian expressed understanding and agreed to proceed.   Adam Phenix, [place], LCSW, [facility], [place] 06/21/2022

## 2022-06-23 ENCOUNTER — Other Ambulatory Visit: Payer: BC Managed Care – PPO

## 2022-07-05 ENCOUNTER — Ambulatory Visit (INDEPENDENT_AMBULATORY_CARE_PROVIDER_SITE_OTHER): Payer: BC Managed Care – PPO | Admitting: Nurse Practitioner

## 2022-07-05 ENCOUNTER — Encounter: Payer: Self-pay | Admitting: Nurse Practitioner

## 2022-07-05 VITALS — BP 98/72 | HR 86 | Ht 70.0 in | Wt 218.0 lb

## 2022-07-05 DIAGNOSIS — Z780 Asymptomatic menopausal state: Secondary | ICD-10-CM | POA: Diagnosis not present

## 2022-07-05 DIAGNOSIS — Z01419 Encounter for gynecological examination (general) (routine) without abnormal findings: Secondary | ICD-10-CM

## 2022-07-05 DIAGNOSIS — E785 Hyperlipidemia, unspecified: Secondary | ICD-10-CM

## 2022-07-05 DIAGNOSIS — Z8349 Family history of other endocrine, nutritional and metabolic diseases: Secondary | ICD-10-CM

## 2022-07-05 NOTE — Progress Notes (Signed)
Amber Green May 16, 1966 [id number]   History:  56 y.o. G2P2 presents for annual exam. No GYN complaints. Postmenopausal - no HRT, no bleeding. H/O RSO for benign cyst. Normal pap history. 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 No symptoms.   Gynecologic History Patient's last menstrual period was 10/19/2016 (approximate).   Contraception/Family planning: post menopausal status Sexually active: No  Health Maintenance Last Pap: 06/27/2021. Results were: Normal Last mammogram: 03/29/2022. Results were: Normal Last colonoscopy: 04/06/2017. Results were: Normal, 10-year recall Last Dexa: Not indicated  Past medical history, past surgical history, family history and social history were all reviewed and documented in the EPIC chart. Widowed. Works in [employer] for [employer]. Son at [employer] for Lexicographer, graduates in May. Youngest senior in [employer], plays baseball, applying to colleges now. Mother diagnosed with breast cancer at age 36.   ROS:  A ROS was performed and pertinent positives and negatives are included.  Exam:  Vitals:   07/05/22 0810  BP: 98/72  Pulse: 86  SpO2: 95%  Weight: 218 lb (98.9 kg)  Height: '5\' 10"'$  (1.778 m)    Body mass index is 31.28 kg/m.  General appearance:  Normal Thyroid:  Symmetrical, normal in size, without palpable masses or nodularity. Respiratory  Auscultation:  Clear without wheezing or rhonchi Cardiovascular  Auscultation:  Regular rate, without rubs, murmurs or gallops  Edema/varicosities:  Not grossly evident Abdominal  Soft,nontender, without masses, guarding or rebound.  Liver/spleen:  No organomegaly noted  Hernia:  None appreciated  Skin  Inspection:  Grossly normal Breasts: Examined lying and sitting.   Right: Without masses, retractions, nipple discharge or axillary adenopathy.   Left: Without masses, retractions, nipple  discharge or axillary adenopathy. Genitourinary   Inguinal/mons:  Normal without inguinal adenopathy  External genitalia:  Normal appearing vulva with no masses, tenderness, or lesions  BUS/Urethra/Skene's glands:  Normal  Vagina:  Atrophic changes  Cervix:  Normal appearing without discharge or lesions  Uterus:  Difficult to palpate due to body habitus but no gross masses or tenderness  Adnexa/parametria:     Rt: Normal in size, without masses or tenderness.   Lt: Normal in size, without masses or tenderness.  Anus and perineum: Normal  Digital rectal exam: Normal sphincter tone without palpated masses or tenderness  Patient informed chaperone available to be present for breast and pelvic exam. Patient has requested no chaperone to be present. Patient has been advised what will be completed during breast and pelvic exam.   Assessment/Plan:  56 y.o. G2P2 for annual exam.   Well female exam with routine gynecological exam - Plan: CBC with Differential/Platelet, Comprehensive metabolic panel. Education provided on SBEs, importance of preventative screenings, current guidelines, high calcium diet, regular exercise, and multivitamin daily.   Postmenopausal - no HRT, no bleeding  Hyperlipidemia, unspecified hyperlipidemia type - Plan: Lipid panel  Family history of thyroid disease in mother - Plan: TSH  Screening for cervical cancer - Normal Pap history. Will repeat at 3-year interval per guidelines.   Screening for breast cancer - Normal mammogram history.  Continue annual screenings.  Normal breast exam today.  Screening for colon cancer - 2018 colonoscopy. Will repeat at GI's recommended interval.   Screening for osteoporosis - Average risk. Will plan for Dexa at age 52.   Return in 1 year for annual.      Tamela Gammon DNP, 8:32 AM 07/05/2022

## 2022-07-06 LAB — CBC WITH DIFFERENTIAL/PLATELET
Absolute Monocytes: 490 cells/uL (ref 200–950)
Basophils Absolute: 69 cells/uL (ref 0–200)
Basophils Relative: 1 %
Eosinophils Absolute: 193 cells/uL (ref 15–500)
Eosinophils Relative: 2.8 %
HCT: 42.1 % (ref 35.0–45.0)
Hemoglobin: 13.9 g/dL (ref 11.7–15.5)
Lymphs Abs: 1628 cells/uL (ref 850–3900)
MCH: 28.5 pg (ref 27.0–33.0)
MCHC: 33 g/dL (ref 32.0–36.0)
MCV: 86.3 fL (ref 80.0–100.0)
MPV: 11.8 fL (ref 7.5–12.5)
Monocytes Relative: 7.1 %
Neutro Abs: 4520 cells/uL (ref 1500–7800)
Neutrophils Relative %: 65.5 %
Platelets: 228 10*3/uL (ref 140–400)
RBC: 4.88 10*6/uL (ref 3.80–5.10)
RDW: 11.8 % (ref 11.0–15.0)
Total Lymphocyte: 23.6 %
WBC: 6.9 10*3/uL (ref 3.8–10.8)

## 2022-07-06 LAB — COMPREHENSIVE METABOLIC PANEL
AG Ratio: 2.4 (calc) (ref 1.0–2.5)
ALT: 13 U/L (ref 6–29)
AST: 13 U/L (ref 10–35)
Albumin: 4.5 g/dL (ref 3.6–5.1)
Alkaline phosphatase (APISO): 76 U/L (ref 37–153)
BUN: 12 mg/dL (ref 7–25)
CO2: 29 mmol/L (ref 20–32)
Calcium: 9.9 mg/dL (ref 8.6–10.4)
Chloride: 105 mmol/L (ref 98–110)
Creat: 0.91 mg/dL (ref 0.50–1.03)
Globulin: 1.9 g/dL (calc) (ref 1.9–3.7)
Glucose, Bld: 88 mg/dL (ref 65–99)
Potassium: 4.9 mmol/L (ref 3.5–5.3)
Sodium: 143 mmol/L (ref 135–146)
Total Bilirubin: 0.6 mg/dL (ref 0.2–1.2)
Total Protein: 6.4 g/dL (ref 6.1–8.1)

## 2022-07-06 LAB — LIPID PANEL
Cholesterol: 198 mg/dL (ref <200)
HDL: 68 mg/dL (ref >=50)
LDL Cholesterol (Calc): 112 mg/dL (calc) — ABNORMAL HIGH
Non-HDL Cholesterol (Calc): 130 mg/dL (calc) — ABNORMAL HIGH (ref <130)
Total CHOL/HDL Ratio: 2.9 (calc) (ref <5.0)
Triglycerides: 85 mg/dL (ref <150)

## 2022-07-06 LAB — TSH: TSH: 1.34 mIU/L (ref 0.40–4.50)

## 2022-07-14 ENCOUNTER — Telehealth: Payer: Self-pay | Admitting: Nurse Practitioner

## 2022-07-14 NOTE — Telephone Encounter (Signed)
Caller Name: Gabrial Poppell Call back phone #: [phone]  Reason for Call: Pt was seen in September and referred to an audiologist. Was coded as diagnostic test and because of this her bill is about $400. Is there anything we can do for pt, change the coding?

## 2022-07-17 NOTE — Telephone Encounter (Signed)
Contacted pt about bill. Bill with Amber Green 05/29/2022 was covered and pt had a $10 payment. Pt is disputing charges from Audiology. Advised that we sent referral but unable to offer advice on coding/billing from a different dept. Texted her the phone # for Audiology [phone].

## 2022-07-31 ENCOUNTER — Ambulatory Visit (INDEPENDENT_AMBULATORY_CARE_PROVIDER_SITE_OTHER): Payer: BC Managed Care – PPO | Admitting: Nurse Practitioner

## 2022-07-31 ENCOUNTER — Encounter: Payer: Self-pay | Admitting: Nurse Practitioner

## 2022-07-31 VITALS — BP 126/80 | HR 92 | Temp 96.9°F | Ht 70.0 in | Wt 217.6 lb

## 2022-07-31 DIAGNOSIS — Z0001 Encounter for general adult medical examination with abnormal findings: Secondary | ICD-10-CM

## 2022-07-31 DIAGNOSIS — K219 Gastro-esophageal reflux disease without esophagitis: Secondary | ICD-10-CM | POA: Diagnosis not present

## 2022-07-31 DIAGNOSIS — Z8719 Personal history of other diseases of the digestive system: Secondary | ICD-10-CM | POA: Insufficient documentation

## 2022-07-31 DIAGNOSIS — K55069 Acute infarction of intestine, part and extent unspecified: Secondary | ICD-10-CM | POA: Diagnosis not present

## 2022-07-31 MED ORDER — OMEPRAZOLE 20 MG PO CPDR: DELAYED_RELEASE_CAPSULE | ORAL | 3 refills | Status: DC

## 2022-07-31 NOTE — Assessment & Plan Note (Addendum)
Resolved ABD pain with use of omeprazole prn, normal bowel sounds, and no change in GI function

## 2022-07-31 NOTE — Patient Instructions (Signed)
Continue with heart healthy diet and regular exercise  Preventive Care 93-57 Years Old, Female Preventive care refers to lifestyle choices and visits with your health care provider that can promote health and wellness. Preventive care visits are also called wellness exams. What can I expect for my preventive care visit? Counseling Your health care provider may ask you questions about your: Medical history, including: Past medical problems. Family medical history. Pregnancy history. Current health, including: Menstrual cycle. Method of birth control. Emotional well-being. Home life and relationship well-being. Sexual activity and sexual health. Lifestyle, including: Alcohol, nicotine or tobacco, and drug use. Access to firearms. Diet, exercise, and sleep habits. Work and work Statistician. Sunscreen use. Safety issues such as seatbelt and bike helmet use. Physical exam Your health care provider will check your: Height and weight. These may be used to calculate your BMI (body mass index). BMI is a measurement that tells if you are at a healthy weight. Waist circumference. This measures the distance around your waistline. This measurement also tells if you are at a healthy weight and may help predict your risk of certain diseases, such as type 2 diabetes and high blood pressure. Heart rate and blood pressure. Body temperature. Skin for abnormal spots. What immunizations do I need?  Vaccines are usually given at various ages, according to a schedule. Your health care provider will recommend vaccines for you based on your age, medical history, and lifestyle or other factors, such as travel or where you work. What tests do I need? Screening Your health care provider may recommend screening tests for certain conditions. This may include: Lipid and cholesterol levels. Diabetes screening. This is done by checking your blood sugar (glucose) after you have not eaten for a while  (fasting). Pelvic exam and Pap test. Hepatitis B test. Hepatitis C test. HIV (human immunodeficiency virus) test. STI (sexually transmitted infection) testing, if you are at risk. Lung cancer screening. Colorectal cancer screening. Mammogram. Talk with your health care provider about when you should start having regular mammograms. This may depend on whether you have a family history of breast cancer. BRCA-related cancer screening. This may be done if you have a family history of breast, ovarian, tubal, or peritoneal cancers. Bone density scan. This is done to screen for osteoporosis. Talk with your health care provider about your test results, treatment options, and if necessary, the need for more tests. Follow these instructions at home: Eating and drinking  Eat a diet that includes fresh fruits and vegetables, whole grains, lean protein, and low-fat dairy products. Take vitamin and mineral supplements as recommended by your health care provider. Do not drink alcohol if: Your health care provider tells you not to drink. You are pregnant, may be pregnant, or are planning to become pregnant. If you drink alcohol: Limit how much you have to 0-1 drink a day. Know how much alcohol is in your drink. In the [place] one drink equals one 12 oz bottle of beer (355 mL), one 5 oz glass of wine (148 mL), or one 1 oz glass of hard liquor (44 mL). Lifestyle Brush your teeth every morning and night with fluoride toothpaste. Floss one time each day. Exercise for at least 30 minutes 5 or more days each week. Do not use any products that contain nicotine or tobacco. These products include cigarettes, chewing tobacco, and vaping devices, such as e-cigarettes. If you need help quitting, ask your health care provider. Do not use drugs. If you are sexually active, practice safe sex.  Use a condom or other form of protection to prevent STIs. If you do not wish to become pregnant, use a form of birth control. If  you plan to become pregnant, see your health care provider for a prepregnancy visit. Take aspirin only as told by your health care provider. Make sure that you understand how much to take and what form to take. Work with your health care provider to find out whether it is safe and beneficial for you to take aspirin daily. Find healthy ways to manage stress, such as: Meditation, yoga, or listening to music. Journaling. Talking to a trusted person. Spending time with friends and family. Minimize exposure to UV radiation to reduce your risk of skin cancer. Safety Always wear your seat belt while driving or riding in a vehicle. Do not drive: If you have been drinking alcohol. Do not ride with someone who has been drinking. When you are tired or distracted. While texting. If you have been using any mind-altering substances or drugs. Wear a helmet and other protective equipment during sports activities. If you have firearms in your house, make sure you follow all gun safety procedures. Seek help if you have been physically or sexually abused. What's next? Visit your health care provider once a year for an annual wellness visit. Ask your health care provider how often you should have your eyes and teeth checked. Stay up to date on all vaccines. This information is not intended to replace advice given to you by your health care provider. Make sure you discuss any questions you have with your health care provider. Document Revised: 03/02/2021 Document Reviewed: 03/02/2021 Elsevier Patient Education  Amber Green.

## 2022-07-31 NOTE — Progress Notes (Signed)
Complete physical exam  Patient: Amber Green   DOB: Jan 30, 1966   56 y.o. Female  MRN: [id number] Visit Date: 07/31/2022  Subjective:    Chief Complaint  Patient presents with   Annual Exam    CPE Pt not fasting  No concerns   Amber Green is a 56 y.o. female who presents today for a complete physical exam. She reports consuming a low fat and low sodium diet.  2x/week exercise with a trainer-cardio and weight training  She generally feels well. She reports sleeping well. She does not have additional problems to discuss today.  Vision:Yes Dental:Yes STD Screen:No  Most recent fall risk assessment:    05/17/2021    3:39 PM  [place] in the past year? 0   Most recent depression screenings:    07/31/2022    9:10 AM 06/20/2022    4:26 PM  PHQ 2/9 Scores  PHQ - 2 Score 0   PHQ- 9 Score 0      Information is confidential and restricted. Go to Review Flowsheets to unlock data.   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 ABD pain with use of omeprazole prn, normal bowel sounds, and no change in GI function  Past Medical History:  Diagnosis Date   Arthritis    Clostridium difficile colitis 2003   Hiatal hernia    Scoliosis    Past Surgical History:  Procedure Laterality Date   APPENDECTOMY  1987   Herrington rods  07/1979   RSO  1987   with appendectomy   Social History   Socioeconomic History   Marital status: Single    Spouse name: Not on file   Number of children: Not on file   Years of education: Not on file   Highest education level: Not on file  Occupational History   Not on file  Tobacco Use   Smoking status: Never   Smokeless tobacco: Never  Vaping Use   Vaping Use: Never used  Substance and Sexual Activity   Alcohol use: Not Currently    Comment: Occas   Drug use: No   Sexual activity: Not Currently    Partners: Male    Birth control/protection: Post-menopausal  Other Topics Concern   Not on file  Social History  Narrative   Not on file   Social Determinants of Health   Financial Resource Strain: Not on file  Food Insecurity: Not on file  Transportation Needs: Not on file  Physical Activity: Not on file  Stress: Not on file  Social Connections: Not on file  Intimate Partner Violence: Not on file   Family Status  Relation Name Status   MGF  (Not Specified)   MGM  (Not Specified)   Mother  Web designer  (Not Specified)   Father  Alive   Neg Hx  (Not Specified)   Family History  Problem Relation Age of Onset   Diabetes Maternal Grandfather    Hypertension Maternal Grandfather    Stroke Maternal Grandfather    Lung cancer Maternal Grandmother    Breast cancer Mother 37       mastectomy   Hypothyroidism Mother    Skin cancer Brother        not melanoma   Hypertension Father    Colon cancer Neg Hx    Esophageal cancer Neg Hx    Liver cancer Neg Hx    Pancreatic cancer Neg Hx    Rectal cancer Neg Hx  Stomach cancer Neg Hx    No Known Allergies  Patient Care Team: Arizbeth Cawthorn, Charlene Brooke, NP as PCP - General (Internal Medicine) Tamela Gammon, NP as Nurse Practitioner (Gynecology)   Medications: Outpatient Medications Prior to Visit  Medication Sig   Melatonin 5 MG CAPS Take by mouth at bedtime.   Multiple Vitamin (MULTIVITAMIN) tablet Take 1 tablet by mouth daily.   [DISCONTINUED] omeprazole (PRILOSEC) 20 MG capsule TAKE 1 CAPSULE BY MOUTH EVERY DAY   No facility-administered medications prior to visit.    Review of Systems  Constitutional:  Negative for fever.  HENT:  Negative for congestion and sore throat.   Eyes:        Negative for visual changes  Respiratory:  Negative for cough and shortness of breath.   Cardiovascular:  Negative for chest pain, palpitations and leg swelling.  Gastrointestinal:  Negative for blood in stool, constipation and diarrhea.  Genitourinary:  Negative for dysuria, frequency and urgency.  Musculoskeletal:  Negative for myalgias.   Skin:  Negative for rash.  Neurological:  Negative for dizziness and headaches.  Hematological:  Does not bruise/bleed easily.  Psychiatric/Behavioral:  Negative for suicidal ideas. The patient is not nervous/anxious.     Last CBC Lab Results  Component Value Date   WBC 6.9 07/05/2022   HGB 13.9 07/05/2022   HCT 42.1 07/05/2022   MCV 86.3 07/05/2022   MCH 28.5 07/05/2022   RDW 11.8 07/05/2022   PLT 228 02/40/9735   Last metabolic panel Lab Results  Component Value Date   GLUCOSE 88 07/05/2022   NA 143 07/05/2022   K 4.9 07/05/2022   CL 105 07/05/2022   CO2 29 07/05/2022   BUN 12 07/05/2022   CREATININE 0.91 07/05/2022   GFRNONAA >60 05/17/2021   CALCIUM 9.9 07/05/2022   PROT 6.4 07/05/2022   ALBUMIN 4.4 05/17/2021   BILITOT 0.6 07/05/2022   ALKPHOS 78 05/17/2021   AST 13 07/05/2022   ALT 13 07/05/2022   ANIONGAP 11 05/17/2021   Last lipids Lab Results  Component Value Date   CHOL 198 07/05/2022   HDL 68 07/05/2022   LDLCALC 112 (H) 07/05/2022   TRIG 85 07/05/2022   CHOLHDL 2.9 07/05/2022   Last hemoglobin A1c Lab Results  Component Value Date   HGBA1C 5.2 04/11/2019   Last thyroid functions Lab Results  Component Value Date   TSH 1.34 07/05/2022      Objective:  BP 126/80 (BP Location: Right Arm, Patient Position: Sitting, Cuff Size: Normal)   Pulse 92   Temp (!) 96.9 F (36.1 C) (Temporal)   Ht '5\' 10"'$  (1.778 m)   Wt 217 lb 9.6 oz (98.7 kg)   LMP 10/19/2016 (Approximate)   SpO2 99%   BMI 31.22 kg/m    Wt Readings from Last 3 Encounters:  07/31/22 217 lb 9.6 oz (98.7 kg)  07/05/22 218 lb (98.9 kg)  05/29/22 219 lb 6.4 oz (99.5 kg)    BP Readings from Last 3 Encounters:  07/31/22 126/80  07/05/22 98/72  05/29/22 120/80    Physical Exam Vitals reviewed.  Constitutional:      General: She is not in acute distress. HENT:     Right Ear: Tympanic membrane, ear canal and external ear normal. There is no impacted cerumen.     Left Ear:  Tympanic membrane, ear canal and external ear normal. There is no impacted cerumen.     Nose: Nose normal.     Mouth/Throat:  Pharynx: No oropharyngeal exudate.  Eyes:     General: No scleral icterus.    Extraocular Movements: Extraocular movements intact.     Conjunctiva/sclera: Conjunctivae normal.  Neck:     Thyroid: No thyromegaly.  Cardiovascular:     Rate and Rhythm: Normal rate and regular rhythm.     Pulses: Normal pulses.     Heart sounds: Normal heart sounds.  Pulmonary:     Effort: Pulmonary effort is normal.     Breath sounds: Normal breath sounds.  Chest:     Chest wall: No tenderness.  Abdominal:     General: Bowel sounds are normal. There is no distension.     Palpations: Abdomen is soft.     Tenderness: There is no abdominal tenderness.  Musculoskeletal:        General: No tenderness. Normal range of motion.     Cervical back: Normal range of motion and neck supple.     Right lower leg: No edema.     Left lower leg: No edema.  Lymphadenopathy:     Cervical: No cervical adenopathy.  Skin:    General: Skin is warm and dry.  Neurological:     Mental Status: She is alert and oriented to person, place, and time.  Psychiatric:        Mood and Affect: Mood normal.        Behavior: Behavior normal.        Thought Content: Thought content normal.        Judgment: Judgment normal.      No results found for any visits on 07/31/22.    Assessment & Plan:    Routine Health Maintenance and Physical Exam  Immunization History  Administered Date(s) Administered   COVID-19, mRNA, vaccine(Comirnaty)12 years and older 06/26/2022   Influenza, Seasonal, Injecte, Preservative Fre 06/23/2020   Influenza,inj,Quad PF,6+ Mos 07/01/2019, 07/19/2021, 05/29/2022   PFIZER(Purple Top)SARS-COV-2 Vaccination 11/14/2019, 12/06/2019, 07/03/2020, 04/02/2021   Tdap 09/07/2015   Zoster Recombinat (Shingrix) 09/16/2020, 12/03/2020    Health Maintenance  Topic Date Due    COVID-19 Vaccine (5 - Pfizer risk series) 08/16/2022 (Originally 05/28/2021)   Hepatitis C Screening  08/01/2023 (Originally 05/30/1983)   HIV Screening  08/01/2023 (Originally 05/29/1980)   MAMMOGRAM  03/29/2024   PAP SMEAR-Modifier  06/27/2024   TETANUS/TDAP  09/06/2025   COLONOSCOPY (Pts 45-90yr Insurance coverage will need to be confirmed)  04/07/2027   INFLUENZA VACCINE  Completed   Zoster Vaccines- Shingrix  Completed   HPV VACCINES  Aged Out   Discussed health benefits of physical activity, and encouraged her to engage in regular exercise appropriate for her age and condition.  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  Return in about 1 year (around 08/01/2023) for CPE (fasting).     CWilfred Lacy NP

## 2022-08-22 ENCOUNTER — Ambulatory Visit: Payer: BC Managed Care – PPO | Admitting: Family Medicine

## 2022-08-22 VITALS — BP 118/78 | HR 75 | Temp 97.6°F | Wt 215.2 lb

## 2022-08-22 DIAGNOSIS — K921 Melena: Secondary | ICD-10-CM | POA: Diagnosis not present

## 2022-08-22 DIAGNOSIS — K529 Noninfective gastroenteritis and colitis, unspecified: Secondary | ICD-10-CM

## 2022-08-22 NOTE — Progress Notes (Signed)
Assessment/Plan:   Problem List Items Addressed This Visit   None Visit Diagnoses     Chronic diarrhea    -  Primary   Relevant Orders   Ambulatory referral to Gastroenterology   Hematochezia       Relevant Orders   Ambulatory referral to Gastroenterology      Chronic diarrhea with some hematochezia.  Current episode is resolved.  Patient tolerating p.o. well no risk factors for dehydration.  Given chronicity, will refer to gastroenterology Return precautions discussed   Subjective:  HPI:  Nathaly Dawkins is a 56 y.o. female who has Back pain; Grief; H/O scoliosis; Lipoma of left lower extremity; GERD (gastroesophageal reflux disease); Family history of melanoma; Melanocytic nevi of trunk; Sensorineural hearing loss (SNHL) of right ear with restricted hearing of left ear; Night terrors; and Omental infarction [facility]) on their problem list..   She  has a past medical history of Arthritis, Clostridium difficile colitis (2003), Hiatal hernia, and Scoliosis..   She presents with chief complaint of Diarrhea (Abdominal cramping, Diarrhea (Blood in stool; Does have hemorrhoids, and nausea x 2 years. Worse after eating.) .   Diarrhea: Patient complains of diarrhea. Onset of diarrhea was several years ago but worse over the past 2 month. Most recent flair stared started on Nov 26.  Patient has had nausea, and cramping.  The diarrhea is occurring approximately 4 times per day . Patient describes diarrhea as watery, but did have blood on Nov 26.  Current episode of diarrhea has now resolved.  Patient denies fever, illness in household contacts, recent antibiotic use, recent camping, recent travel, significant abdominal pain, unintentional weight loss.  Previous visits for diarrhea: none. Evaluation to date: Colonoscopy in 2018 showed diverticuli, remainder was normal.  CT abdomen pelvis 2020 showed evidence of diverticulitis and hiatal hernia as well as ovarian lesions bilaterally. Treatment  to date: OTC Pepto-Bismol with some relief.   Past Surgical History:  Procedure Laterality Date   APPENDECTOMY  1987   Herrington rods  07/1979   RSO  1987   with appendectomy    Outpatient Medications Prior to Visit  Medication Sig Dispense Refill   Multiple Vitamin (MULTIVITAMIN) tablet Take 1 tablet by mouth daily.     omeprazole (PRILOSEC) 20 MG capsule TAKE 1 CAPSULE BY MOUTH EVERY DAY 90 capsule 3   Melatonin 5 MG CAPS Take by mouth at bedtime. (Patient not taking: Reported on 08/22/2022)     No facility-administered medications prior to visit.    Family History  Problem Relation Age of Onset   Diabetes Maternal Grandfather    Hypertension Maternal Grandfather    Stroke Maternal Grandfather    Lung cancer Maternal Grandmother    Breast cancer Mother 67       mastectomy   Hypothyroidism Mother    Skin cancer Brother        not melanoma   Hypertension Father    Colon cancer Neg Hx    Esophageal cancer Neg Hx    Liver cancer Neg Hx    Pancreatic cancer Neg Hx    Rectal cancer Neg Hx    Stomach cancer Neg Hx     Social History   Socioeconomic History   Marital status: Single    Spouse name: Not on file   Number of children: Not on file   Years of education: Not on file   Highest education level: Not on file  Occupational History   Not on file  Tobacco Use  Smoking status: Never   Smokeless tobacco: Never  Vaping Use   Vaping Use: Never used  Substance and Sexual Activity   Alcohol use: Not Currently    Comment: Occas   Drug use: No   Sexual activity: Not Currently    Partners: Male    Birth control/protection: Post-menopausal  Other Topics Concern   Not on file  Social History Narrative   Not on file   Social Determinants of Health   Financial Resource Strain: Not on file  Food Insecurity: Not on file  Transportation Needs: Not on file  Physical Activity: Not on file  Stress: Not on file  Social Connections: Not on file  Intimate Partner  Violence: Not on file                                                                                                 Objective:  Physical Exam: BP 118/78 (BP Location: Right Arm, Patient Position: Sitting, Cuff Size: Large)   Pulse 75   Temp 97.6 F (36.4 C) (Temporal)   Wt 215 lb 3.2 oz (97.6 kg)   LMP 10/19/2016 (Approximate)   SpO2 98%   BMI 30.88 kg/m    General: No acute distress. Awake and conversant.  Eyes: Normal conjunctiva, anicteric. Round symmetric pupils.  ENT: Hearing grossly intact. No nasal discharge.  Neck: Neck is supple. No masses or thyromegaly.  Respiratory: Respirations are non-labored. No auditory wheezing.  Skin: Warm. No rashes or ulcers.  Psych: Alert and oriented. Cooperative, Appropriate mood and affect, Normal judgment.  CV: No cyanosis or JVD ABD: Nontender nondistended MSK: Normal ambulation. No clubbing  Neuro: Sensation and CN II-XII grossly normal.        Alesia Banda, MD, MS

## 2022-08-30 ENCOUNTER — Encounter: Payer: Self-pay | Admitting: Physician Assistant

## 2022-09-28 ENCOUNTER — Encounter: Payer: Self-pay | Admitting: Nurse Practitioner

## 2022-09-29 ENCOUNTER — Encounter: Payer: Self-pay | Admitting: *Deleted

## 2022-10-02 ENCOUNTER — Other Ambulatory Visit (INDEPENDENT_AMBULATORY_CARE_PROVIDER_SITE_OTHER): Payer: BC Managed Care – PPO

## 2022-10-02 ENCOUNTER — Ambulatory Visit (INDEPENDENT_AMBULATORY_CARE_PROVIDER_SITE_OTHER): Payer: BC Managed Care – PPO | Admitting: Physician Assistant

## 2022-10-02 ENCOUNTER — Encounter: Payer: Self-pay | Admitting: Physician Assistant

## 2022-10-02 VITALS — BP 118/78 | HR 76 | Ht 70.0 in | Wt 218.0 lb

## 2022-10-02 DIAGNOSIS — E739 Lactose intolerance, unspecified: Secondary | ICD-10-CM | POA: Diagnosis not present

## 2022-10-02 DIAGNOSIS — R197 Diarrhea, unspecified: Secondary | ICD-10-CM

## 2022-10-02 LAB — HIGH SENSITIVITY CRP: CRP, High Sensitivity: 2.13 mg/L (ref 0.000–5.000)

## 2022-10-02 LAB — SEDIMENTATION RATE: Sed Rate: 15 mm/hr (ref 0–30)

## 2022-10-02 MED ORDER — HYOSCYAMINE SULFATE 0.125 MG SL SUBL: 0.1250 mg | SUBLINGUAL_TABLET | Freq: Four times a day (QID) | SUBLINGUAL | 3 refills | Status: DC | PRN

## 2022-10-02 NOTE — Progress Notes (Signed)
Subjective:    Patient ID: Amber Green, female    DOB: Mar 01, 1966, 57 y.o.   MRN: [id number]  HPI Amber Green is a 57 year old female, established with Dr. Carlean Purl but only seen previously here for colonoscopy in 2018.  She is referred back today by Dr. Josephine Igo for evaluation of chronic diarrhea. At colonoscopy in July 2018 she was found to have multiple small and large mouth diverticuli and otherwise normal exam, no polyps, no specimens taken.  Labs from October 2023 with normal CBC and c-Met. Patient says her current symptoms have been present over the past 2 years with gradual worsening over time.  She is actually not having chronic diarrhea but having intermittent diarrhea with an episode occurring every 1 to 2 months.  She says these always hit in the middle of the night with bad abdominal cramping followed by several episodes of diarrhea and then gradual resolution of her symptoms over the next 2 to 3 hours.  She had a particularly bad episode at the end of November worse than any of her others with severe abdominal cramping "worse than labor".  Nausea without vomiting and multiple episodes of diarrhea.  This episode lasted for 4 to 5 days before completely resolving.  She says she did not have much appetite during that time and was nauseated Since that time she has been keeping track of her diet and decided that lactose may be triggering her symptoms so she has gone on a mostly lactose-free diet, she has been taking Lactaid tablets whenever she knows she is going to consume something with lactose.  And says she has not had any episodes since that time. Traditionally she is felt fine in between these episodes with no interval abdominal discomfort bloating cramping etc. and bowel movements are normal.  The only time she has seen blood was with this last bad episode and noted some bright red blood on the tissue, she is not sure about any blood in the stool. She also feels that she is  intolerant to cereal and wheat bread and has come off of those. Family history negative for celiac disease, IBD, and colon cancers.  She does have mild GERD with symptoms well-controlled on low-dose omeprazole 20 mg..  Review of Systems Pertinent positive and negative review of systems were noted in the above HPI section.  All other review of systems was otherwise negative.   Outpatient Encounter Medications as of 10/02/2022  Medication Sig   hyoscyamine (LEVSIN SL) 0.125 MG SL tablet Place 1 tablet (0.125 mg total) under the tongue every 6 (six) hours as needed (cramping/abdominal pain/diarrhea).   Multiple Vitamin (MULTIVITAMIN) tablet Take 1 tablet by mouth daily.   omeprazole (PRILOSEC) 20 MG capsule TAKE 1 CAPSULE BY MOUTH EVERY DAY   Melatonin 5 MG CAPS Take by mouth at bedtime. (Patient not taking: Reported on 08/22/2022)   No facility-administered encounter medications on file as of 10/02/2022.   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   Family history of melanoma 05/29/2022   Melanocytic nevi of trunk 05/29/2022   Sensorineural hearing loss (SNHL) of right ear with restricted hearing of left ear 05/29/2022   Night terrors 05/29/2022   GERD (gastroesophageal reflux disease) 05/17/2021   Lipoma of left lower extremity 11/16/2017   H/O scoliosis 01/25/2017   Back pain 01/01/2017   Grief 01/01/2017   Social History   Socioeconomic History   Marital status: Widowed  Spouse name: Not on file   Number of children: 2   Years of education: Not on file   Highest education level: Not on file  Occupational History   Not on file  Tobacco Use   Smoking status: Never   Smokeless tobacco: Never  Vaping Use   Vaping Use: Never used  Substance and Sexual Activity   Alcohol use: Not Currently    Comment: Occas   Drug use: No   Sexual activity: Not Currently    Partners: Male    Birth control/protection:  Post-menopausal  Other Topics Concern   Not on file  Social History Narrative   Not on file   Social Determinants of Health   Financial Resource Strain: Not on file  Food Insecurity: Not on file  Transportation Needs: Not on file  Physical Activity: Not on file  Stress: Not on file  Social Connections: Not on file  Intimate Partner Violence: Not on file    Ms. Sam's family history includes Breast cancer (age of onset: 8) in her mother; Diabetes in her maternal grandfather; Hypertension in her father and maternal grandfather; Hypothyroidism in her mother; Lung cancer in her maternal grandmother; Skin cancer in her brother; Stroke in her maternal grandfather.      Objective:    Vitals:   10/02/22 1434  BP: 118/78  Pulse: 76  SpO2: 98%    Physical Exam Well-developed well-nourished  WF  in no acute distress.  Height, Weight,218 BMI 31.28  HEENT; nontraumatic normocephalic, EOMI, PE R LA, sclera anicteric. Oropharynx;not examined Neck; supple, no JVD Cardiovascular; regular rate and rhythm with S1-S2, no murmur rub or gallop Pulmonary; Clear bilaterally Abdomen; soft, nontender, nondistended, no palpable mass or hepatosplenomegaly, bowel sounds are active Rectal;not done today  Skin; benign exam, no jaundice rash or appreciable lesions Extremities; no clubbing cyanosis or edema skin warm and dry Neuro/Psych; alert and oriented x4, grossly nonfocal mood and affect appropriate        Assessment & Plan:   #75 57 year old white female with intermittent episodes of abdominal cramping and diarrhea occurring every 1 to 2 months over the past couple of years, usually occurring in the middle of the night with symptoms lasting for 2 to 3 hours.  She had a bad episode about 2 months ago which caused much more intense abdominal cramping and more prolonged diarrhea lasting for couple of days, as well as associated nausea.  Since placing herself on a very low lactose diet and using  Lactaid tablets she has not had any recurrent episodes. She feels fine in between these previous episodes.  I think her symptoms are likely secondary to lactose intolerance and/or other food intolerance precipitating IBS type symptoms  #2 colon cancer screening-up-to-date with negative colonoscopy July 2018 indicated for 10-year interval follow-up #3.  Diverticulosis #4.  GERD mild stable on low-dose omeprazole  Plan; continue very low lactose/lactose-free diet, patient was given a copy of diet. Continue Lactaid tablets 1-2 with meals. I have given her a prescription for Levsin sublingual dissolve 1 on tongue every 6 hours as needed to use in the event she has future episodes at onset of symptoms. Will check sed rate, CRP TTG and IgA today We discussed a lactose breath test to confirm lactose intolerance but she is comfortable at this point following a low lactose diet. Have asked her to call back for advice should she start having recurrence of the episodes despite following the low lactose/lactose-free diet and at that point would  need further workup.  Tommi Crepeau Genia Harold PA-C 10/02/2022   Cc: Flossie Buffy, NP

## 2022-10-02 NOTE — Patient Instructions (Addendum)
Please purchase the following medications over the counter and take as directed: Lactaid tablets- use as directed when having any dairy intake  We have sent the following medications to your pharmacy for you to pick up at your convenience: Levsin SL-dissolve 1 tablet under the tongue every 6 hours as needed for abdominal pain/cramping/diarrhea  Please follow a lactose free diet (this was given to you at your visit today).  Your provider has requested that you go to the basement level for lab work before leaving today. Press "B" on the elevator. The lab is located at the first door on the left as you exit the elevator.  Please follow up in 3-4 months if you have new or worsening symptoms.  _______________________________________________________  If your blood pressure at your visit was 140/90 or greater, please contact your primary care physician to follow up on this.  _______________________________________________________  If you are age 58 or older, your body mass index should be between 23-30. Your Body mass index is 31.28 kg/m. If this is out of the aforementioned range listed, please consider follow up with your Primary Care Provider.  If you are age 58 or younger, your body mass index should be between 19-25. Your Body mass index is 31.28 kg/m. If this is out of the aformentioned range listed, please consider follow up with your Primary Care Provider.   ________________________________________________________  The [facility] providers would like to encourage you to use [facility] to communicate with providers for non-urgent requests or questions.  Due to long hold times on the telephone, sending your provider a message by [facility] may be a faster and more efficient way to get a response.  Please allow 48 business hours for a response.  Please remember that this is for non-urgent requests.  _______________________________________________________  Due to recent changes in healthcare  laws, you may see the results of your imaging and laboratory studies on MyChart before your provider has had a chance to review them.  We understand that in some cases there may be results that are confusing or concerning to you. Not all laboratory results come back in the same time frame and the provider may be waiting for multiple results in order to interpret others.  Please give [place] 48 hours in order for your provider to thoroughly review all the results before contacting the office for clarification of your results.

## 2022-10-03 LAB — IGA: Immunoglobulin A: 95 mg/dL (ref 47–310)

## 2022-10-03 LAB — TISSUE TRANSGLUTAMINASE, IGA: (tTG) Ab, IgA: <1 U/mL

## 2022-10-05 ENCOUNTER — Telehealth: Payer: Self-pay | Admitting: Physician Assistant

## 2022-10-05 MED ORDER — HYOSCYAMINE SULFATE 0.125 MG SL SUBL: 0.1250 mg | SUBLINGUAL_TABLET | Freq: Four times a day (QID) | SUBLINGUAL | 3 refills | Status: DC | PRN

## 2022-10-05 NOTE — Telephone Encounter (Signed)
The levsin SL [facility] [address], [facility] [place] [facility]. She will call us back with any questions.

## 2022-10-05 NOTE — Telephone Encounter (Signed)
Patient states pharmacy does not have medication  Levsin. Please advise.

## 2022-10-09 ENCOUNTER — Encounter: Payer: Self-pay | Admitting: Family Medicine

## 2022-10-09 ENCOUNTER — Ambulatory Visit (INDEPENDENT_AMBULATORY_CARE_PROVIDER_SITE_OTHER): Payer: BC Managed Care – PPO | Admitting: Family Medicine

## 2022-10-09 VITALS — BP 118/78 | HR 65 | Temp 98.2°F | Wt 215.8 lb

## 2022-10-09 DIAGNOSIS — R0602 Shortness of breath: Secondary | ICD-10-CM

## 2022-10-09 DIAGNOSIS — U071 COVID-19: Secondary | ICD-10-CM | POA: Insufficient documentation

## 2022-10-09 DIAGNOSIS — R0989 Other specified symptoms and signs involving the circulatory and respiratory systems: Secondary | ICD-10-CM | POA: Diagnosis not present

## 2022-10-09 HISTORY — DX: COVID-19: U07.1

## 2022-10-09 LAB — POCT INFLUENZA A/B
Influenza A, POC: NEGATIVE
Influenza B, POC: NEGATIVE

## 2022-10-09 LAB — POC COVID19 BINAXNOW: SARS Coronavirus 2 Ag: POSITIVE — AB

## 2022-10-09 MED ORDER — BENZONATATE 200 MG PO CAPS: 200.0000 mg | ORAL_CAPSULE | Freq: Two times a day (BID) | ORAL | 0 refills | Status: DC | PRN

## 2022-10-09 MED ORDER — FLUTICASONE PROPIONATE 50 MCG/ACT NA SUSP: 2.0000 | Freq: Every day | NASAL | 6 refills | Status: DC

## 2022-10-09 NOTE — Progress Notes (Signed)
Assessment/Plan:   Problem List Items Addressed This Visit       Other   COVID    The patient is a 25 female presenting with symptoms suggestive of mild COVID-19 infection. She has a positive test result for COVID-19 and presents with symptoms that are predominantly affecting her upper respiratory tract, indicating a likely mild course of the illness.  Plan: The focus will be on symptomatic management, given the mild nature of her illness and the absence of high-risk factors for severe disease. Recommend supportive care with proper hydration, rest, and continuation of over-the-counter medications as tolerated for symptom relief. Introduction of Flonase (fluticasone) nasal spray for nasal congestion and sinus pressure would possibly provide symptomatic relief. In addition, it is advised that the patient quarantines at home for 5 days from the onset of symptoms, and post-quarantine, to wear a mask for 5 additional days when around others, in accordance with the current CDC guidance.  The patient expresses concern regarding transmission of COVID-19 to her children. It was noted that one of her children had previously contracted COVID-19. This issue was discussed, with the recommendation of standard precautions and monitoring for symptoms in her children.      Relevant Medications   fluticasone (FLONASE) 50 MCG/ACT nasal spray   benzonatate (TESSALON) 200 MG capsule   Other Visit Diagnoses     Runny nose    -  Primary   Relevant Orders   POC COVID-19 (Completed)   POCT Influenza A/B (Completed)       Medications Discontinued During This Encounter  Medication Reason   Melatonin 5 MG CAPS       Subjective:  HPI: Encounter date: 10/09/2022  Amber Green is a 57 y.o. 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   She  has a past medical history of Arthritis, Clostridium difficile colitis (2003), Diverticulosis, GERD (gastroesophageal reflux disease), Hiatal hernia, Night terrors, and Scoliosis.Marland Kitchen  CHIEF COMPLAINT: Patient is a 57 year old female, presents with 3 days of cold/sinus infection-like symptoms.  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   Problem 1: The patient has been experiencing symptoms consistent with a cold or sinus infection for approximately 3 days, which she initially thought was just a sinus infection. It is reported that these symptoms are similar to a previous instance of COVID-19 that she experienced two years ago. Her oxygen saturation initially read at 93% but improved to 97% upon recheck. She reports shortness of breath, which she attributes to nasal congestion, without chest pain. Patient uses over-the-counter medications and home remedies for relief of cold and sinus symptoms. Specifically mentioned are Advil Cold & Sinus, and various forms of hot tea with honey and apple cider vinegar for throat discomfort.  Problem 2: The patient also expresses concern about the potential for her children to contract COVID-19. She has an 58 year old and a 58 year old who are both currently in school.  REVIEW OF SYSTEMS: Positive findings: Nasal congestion, headaches, sinus pressure, shortness of breath while lying down. Negative by systems: No chest pain, fever, chills, nausea, vomiting, or severe cough noted during the encounter.  Past Surgical History:  Procedure Laterality Date  APPENDECTOMY  1987   Herrington rods  07/1979   RSO  1987   with appendectomy    Outpatient Medications Prior to Visit  Medication Sig Dispense Refill   hyoscyamine (LEVSIN SL) 0.125 MG SL  tablet Place 1 tablet (0.125 mg total) under the tongue every 6 (six) hours as needed (cramping/abdominal pain/diarrhea). 30 tablet 3   Multiple Vitamin (MULTIVITAMIN) tablet Take 1 tablet by mouth daily.     omeprazole (PRILOSEC) 20 MG capsule TAKE 1 CAPSULE BY MOUTH EVERY DAY 90 capsule 3   Melatonin 5 MG CAPS Take by mouth at bedtime. (Patient not taking: Reported on 08/22/2022)     No facility-administered medications prior to visit.    Family History  Problem Relation Age of Onset   Diabetes Maternal Grandfather    Hypertension Maternal Grandfather    Stroke Maternal Grandfather    Lung cancer Maternal Grandmother    Breast cancer Mother 2       mastectomy   Hypothyroidism Mother    Skin cancer Brother        not melanoma   Hypertension Father    Colon cancer Neg Hx    Esophageal cancer Neg Hx    Liver cancer Neg Hx    Pancreatic cancer Neg Hx    Rectal cancer Neg Hx    Stomach cancer Neg Hx     Social History   Socioeconomic History   Marital status: Widowed    Spouse name: Not on file   Number of children: 2   Years of education: Not on file   Highest education level: Not on file  Occupational History   Not on file  Tobacco Use   Smoking status: Never   Smokeless tobacco: Never  Vaping Use   Vaping Use: Never used  Substance and Sexual Activity   Alcohol use: Not Currently    Comment: Occas   Drug use: No   Sexual activity: Not Currently    Partners: Male    Birth control/protection: Post-menopausal  Other Topics Concern   Not on file  Social History Narrative   Not on file   Social Determinants of Health   Financial Resource Strain: Not on file  Food Insecurity: Not on file  Transportation Needs: Not on file  Physical Activity: Not on file  Stress: Not on file  Social Connections: Not on file  Intimate Partner Violence: Not on file                                                                                                 Objective:   Physical Exam: BP 118/78 (BP Location: Left Arm, Patient Position: Sitting, Cuff Size: Large)   Pulse 65   Temp 98.2 F (36.8 C) (Oral)   Wt 215 lb 12.8 oz (97.9 kg)   LMP 10/19/2016 (Approximate)   SpO2 97%   BMI 30.96 kg/m    Gen: NAD, resting comfortably CV: RRR with no murmurs appreciated Pulm: NWOB, CTAB with no crackles, wheezes, or rhonchi GI: Normal bowel sounds present. Soft, Nontender, Nondistended. MSK: no edema, cyanosis, or clubbing noted Skin:  warm, dry Neuro: grossly normal, moves all extremities Psych: Normal affect and thought content  Results for orders placed or performed in visit on 10/09/22  POC COVID-19  Result Value Ref Range   SARS Coronavirus 2 Ag Positive (A) Negative  POCT Influenza A/B  Result Value Ref Range   Influenza A, POC Negative Negative   Influenza B, POC Negative Negative         Alesia Banda, MD, MS

## 2022-10-09 NOTE — Assessment & Plan Note (Signed)
The patient is a 57 female presenting with symptoms suggestive of mild COVID-19 infection. She has a positive test result for COVID-19 and presents with symptoms that are predominantly affecting her upper respiratory tract, indicating a likely mild course of the illness.  Plan: The focus will be on symptomatic management, given the mild nature of her illness and the absence of high-risk factors for severe disease. Recommend supportive care with proper hydration, rest, and continuation of over-the-counter medications as tolerated for symptom relief. Introduction of Flonase (fluticasone) nasal spray for nasal congestion and sinus pressure would possibly provide symptomatic relief. In addition, it is advised that the patient quarantines at home for 5 days from the onset of symptoms, and post-quarantine, to wear a mask for 5 additional days when around others, in accordance with the current CDC guidance.  The patient expresses concern regarding transmission of COVID-19 to her children. It was noted that one of her children had previously contracted COVID-19. This issue was discussed, with the recommendation of standard precautions and monitoring for symptoms in her children.

## 2022-10-09 NOTE — Patient Instructions (Signed)
For COVID, Quarantine at home for 5 days since the start of illness. After this you, may exit quarantine, but please still wear a mask indoors and outdoors for 5 more days.   Please be sure to drink plenty of fluids.   You may take the following OTC medications to help with symptoms:  For cough, use  cough syrups or other cough suppressants.  For headache, sore throat, fevers, muscle aches, chills, other pain, take ibuprofen or tylenol  For congestion, use nasal sprays, decongestants, or antihistamines  Please follow up if no improvement.   Go to ED if you have severe chest pain, fevers, shortness of breath or other worrisome symptoms.   

## 2022-11-14 ENCOUNTER — Encounter: Payer: Self-pay | Admitting: Family Medicine

## 2022-11-15 NOTE — Telephone Encounter (Signed)
Spoke with Ms. Underhill and advised her of annotation below. I advised her that we will reach out to [facility] and if we can't reach him she is listed as an alternative contact for lab results. I also advised her that Suezanne Jacquet is due for his second HPV around 12/12/2022 and this can be a nurse visit. Ms. Frase advised me that patient received tdap last year at another facility. I advised her that in the [place] and his mychart there was no documentation of a tdap in the last 10 years and that's why it was recommended by the provider and patient agreed to vaccines. I advised her I will call her or send a mychart message once I hear back from the provider about the tdap vaccine that was given last year and if it's ok if he got one this year and if meninB is recommended for college. She verbalized understanding.

## 2023-02-27 ENCOUNTER — Other Ambulatory Visit: Payer: Self-pay | Admitting: Nurse Practitioner

## 2023-02-27 DIAGNOSIS — Z1231 Encounter for screening mammogram for malignant neoplasm of breast: Secondary | ICD-10-CM

## 2023-04-02 ENCOUNTER — Ambulatory Visit
Admission: RE | Admit: 2023-04-02 | Discharge: 2023-04-02 | Disposition: A | Discharge status: Home / Self Care | Payer: BC Managed Care – PPO | Source: Ambulatory Visit | Attending: Nurse Practitioner | Admitting: Nurse Practitioner

## 2023-04-02 DIAGNOSIS — Z1231 Encounter for screening mammogram for malignant neoplasm of breast: Secondary | ICD-10-CM

## 2023-06-04 ENCOUNTER — Telehealth: Payer: Self-pay | Admitting: Nurse Practitioner

## 2023-06-04 NOTE — Telephone Encounter (Signed)
Vaccine update

## 2023-06-04 NOTE — Telephone Encounter (Signed)
[facility] .pt said you can note this in her records

## 2023-07-16 ENCOUNTER — Ambulatory Visit (INDEPENDENT_AMBULATORY_CARE_PROVIDER_SITE_OTHER): Payer: BC Managed Care – PPO | Admitting: Nurse Practitioner

## 2023-07-16 ENCOUNTER — Encounter: Payer: Self-pay | Admitting: Nurse Practitioner

## 2023-07-16 VITALS — BP 100/60 | HR 85 | Ht 69.5 in | Wt 220.0 lb

## 2023-07-16 DIAGNOSIS — Z780 Asymptomatic menopausal state: Secondary | ICD-10-CM

## 2023-07-16 DIAGNOSIS — Z01419 Encounter for gynecological examination (general) (routine) without abnormal findings: Secondary | ICD-10-CM | POA: Diagnosis not present

## 2023-07-16 NOTE — Progress Notes (Signed)
   Amber Green 05/11/1966 [id number]   History:  57 y.o. G2P2 presents for annual exam. Postmenopausal - no HRT, no bleeding. H/O RSO for benign cyst. Normal pap history.  Gynecologic History Patient's last menstrual period was 10/19/2016 (approximate).   Contraception/Family planning: post menopausal status Sexually active: No  Health Maintenance Last Pap: 06/27/2021. Results were: Normal, 3-year repeat Last mammogram: 04/02/2023. Results were: Normal Last colonoscopy: 04/06/2017. Results were: Normal, 10-year recall Last Dexa: Not indicated  Past medical history, past surgical history, family history and social history were all reviewed and documented in the EPIC chart. Widowed. Works in [employer] for [employer]. 2 sons. Mother diagnosed with breast cancer at age 37.   ROS:  A ROS was performed and pertinent positives and negatives are included.  Exam:  Vitals:   07/16/23 0810  BP: 100/60  Pulse: 85  SpO2: 95%  Weight: 220 lb (99.8 kg)  Height: 5' 9.5" (1.765 m)     Body mass index is 32.02 kg/m.  General appearance:  Normal Thyroid:  Symmetrical, normal in size, without palpable masses or nodularity. Respiratory  Auscultation:  Clear without wheezing or rhonchi Cardiovascular  Auscultation:  Regular rate, without rubs, murmurs or gallops  Edema/varicosities:  Not grossly evident Abdominal  Soft,nontender, without masses, guarding or rebound.  Liver/spleen:  No organomegaly noted  Hernia:  None appreciated  Skin  Inspection:  Grossly normal Breasts: Examined lying and sitting.   Right: Without masses, retractions, nipple discharge or axillary adenopathy.   Left: Without masses, retractions, nipple discharge or axillary adenopathy. Pelvic: External genitalia:  no lesions              Urethra:  normal appearing urethra with no masses, tenderness or lesions              Bartholins and Skenes: normal                 Vagina: normal appearing vagina with normal  color and discharge, no lesions. Atrophic changes              Cervix: no lesions Bimanual Exam:  Uterus:  no masses or tenderness              Adnexa: no mass, fullness, tenderness              Rectovaginal: Deferred              Anus:  normal, no lesions  Patient informed chaperone available to be present for breast and pelvic exam. Patient has requested no chaperone to be present. Patient has been advised what will be completed during breast and pelvic exam.   Assessment/Plan:  57 y.o. G2P2 for annual exam.   Well female exam with routine gynecological exam - Education provided on SBEs, importance of preventative screenings, current guidelines, high calcium diet, regular exercise, and multivitamin daily. Lab tech not in office today. Will have labs done with PCP.   Postmenopausal - no HRT, no bleeding  Screening for cervical cancer - Normal Pap history. Will repeat at 3-year interval per guidelines.   Screening for breast cancer - Normal mammogram history.  Continue annual screenings.  Normal breast exam today.  Screening for colon cancer - 2018 colonoscopy. Will repeat at GI's recommended interval.   Screening for osteoporosis - Average risk. Will plan for Dexa at age 73.   Return in 1 year for annual.      Olivia Mackie DNP, 8:27 AM 07/16/2023

## 2023-08-02 ENCOUNTER — Encounter: Payer: BC Managed Care – PPO | Admitting: Nurse Practitioner

## 2023-08-15 ENCOUNTER — Telehealth: Payer: Self-pay | Admitting: Nurse Practitioner

## 2023-08-15 ENCOUNTER — Encounter: Payer: Self-pay | Admitting: Nurse Practitioner

## 2023-08-15 ENCOUNTER — Ambulatory Visit: Payer: BC Managed Care – PPO | Admitting: Nurse Practitioner

## 2023-08-15 VITALS — BP 114/79 | HR 85 | Temp 98.3°F | Resp 18 | Ht 69.5 in | Wt 222.0 lb

## 2023-08-15 DIAGNOSIS — G8929 Other chronic pain: Secondary | ICD-10-CM

## 2023-08-15 DIAGNOSIS — E7800 Pure hypercholesterolemia, unspecified: Secondary | ICD-10-CM

## 2023-08-15 DIAGNOSIS — F514 Sleep terrors [night terrors]: Secondary | ICD-10-CM

## 2023-08-15 DIAGNOSIS — Z0000 Encounter for general adult medical examination without abnormal findings: Secondary | ICD-10-CM | POA: Diagnosis not present

## 2023-08-15 DIAGNOSIS — Z0001 Encounter for general adult medical examination with abnormal findings: Secondary | ICD-10-CM

## 2023-08-15 DIAGNOSIS — Z780 Asymptomatic menopausal state: Secondary | ICD-10-CM

## 2023-08-15 DIAGNOSIS — Z8719 Personal history of other diseases of the digestive system: Secondary | ICD-10-CM

## 2023-08-15 DIAGNOSIS — N949 Unspecified condition associated with female genital organs and menstrual cycle: Secondary | ICD-10-CM | POA: Insufficient documentation

## 2023-08-15 DIAGNOSIS — F4323 Adjustment disorder with mixed anxiety and depressed mood: Secondary | ICD-10-CM | POA: Diagnosis not present

## 2023-08-15 LAB — COMPREHENSIVE METABOLIC PANEL
ALT: 14 U/L (ref 0–35)
AST: 14 U/L (ref 0–37)
Albumin: 4.6 g/dL (ref 3.5–5.2)
Alkaline Phosphatase: 86 U/L (ref 39–117)
BUN: 14 mg/dL (ref 6–23)
CO2: 31 meq/L (ref 19–32)
Calcium: 9.8 mg/dL (ref 8.4–10.5)
Chloride: 103 meq/L (ref 96–112)
Creatinine, Ser: 0.91 mg/dL (ref 0.40–1.20)
GFR: 69.69 mL/min (ref >60.00)
Glucose, Bld: 88 mg/dL (ref 70–99)
Potassium: 4.3 meq/L (ref 3.5–5.1)
Sodium: 141 meq/L (ref 135–145)
Total Bilirubin: 0.7 mg/dL (ref 0.2–1.2)
Total Protein: 6.8 g/dL (ref 6.0–8.3)

## 2023-08-15 LAB — LIPID PANEL
Cholesterol: 198 mg/dL (ref 0–200)
HDL: 67.5 mg/dL (ref >39.00)
LDL Cholesterol: 108 mg/dL — ABNORMAL HIGH (ref 0–99)
NonHDL: 130.53
Total CHOL/HDL Ratio: 3
Triglycerides: 112 mg/dL (ref 0.0–149.0)
VLDL: 22.4 mg/dL (ref 0.0–40.0)

## 2023-08-15 MED ORDER — SERTRALINE HCL 25 MG PO TABS
25.0000 mg | ORAL_TABLET | Freq: Every day | ORAL | 5 refills | Status: DC
Start: 2023-08-15 — End: 2024-04-24

## 2023-08-15 NOTE — Telephone Encounter (Signed)
See A/P note

## 2023-08-15 NOTE — Assessment & Plan Note (Signed)
Per CT 04/2021: For clarification, the initial report states that the "adnexal cyst" is on the RIGHT in the body and on the LEFT in the conclusion. The finding, adnexal cyst, in the adnexa for which follow-up was suggested in 6-12 months with dedicated sonogram is on the RIGHT. Pelvic US 07/2021:Uterus 5.62 x 3.77 x 2.56 cm. EMS 4.17 mm.  No masses or thickening. Right ovary surgically absent. Right adnexal cyst 2.8 x 2.2 cm. Limited visualization due to overlying bowel gas. Left ovary not seen. No free fluid.  Exam limited due to overlying bowel gas.   Denies any pelvis pain.

## 2023-08-15 NOTE — Progress Notes (Signed)
Complete physical exam  Patient: Amber Green   DOB: November 18, 1965   57 y.o. Female  MRN: [id number] Visit Date: 08/15/2023  Subjective:    Chief Complaint  Patient presents with   Annual Exam    PT is here for annual exam. PT have concerns to address.     Amber Green is a 57 y.o. female who presents today for a complete physical exam. She reports consuming a general diet.  2x/week-cardio and weight training  She generally feels well. She reports sleeping well. She does have additional problems to discuss today.  Vision:Yes Dental:Yes STD Screen:No  BP Readings from Last 3 Encounters:  08/15/23 114/79  07/16/23 100/60  10/09/22 118/78   Wt Readings from Last 3 Encounters:  08/15/23 222 lb (100.7 kg)  07/16/23 220 lb (99.8 kg)  10/09/22 215 lb 12.8 oz (97.9 kg)    Most recent fall risk assessment:    08/15/2023    8:12 AM  Fall Risk   Falls in the past year? 0  Number falls in past yr: 0  Injury with Fall? 0  Risk for fall due to : No Fall Risks  Follow up Falls evaluation completed   Depression screen:Yes - No Depression Most recent depression screenings:    08/15/2023    8:15 AM 07/31/2022    9:10 AM  PHQ 2/9 Scores  PHQ - 2 Score 2 0  PHQ- 9 Score 6 0   HPI  Night terrors She realized this was triggered by use of melatonin after husband's death. She stopped med 2022-10-10. Denies any night terrors since then  History of bowel infarction Per CT 04/2021: Signs of omental infarct in the central abdomen, a potential cause of abdominal pain. Moderate hiatal hernia. Approximately 30% of the stomach is now herniated into the chest, increased from previous imaging from 2020. Mild thickening about the proximal stomach and esophagus may reflect mild esophagogastritis. Stranding about the descending colon may represent mild diverticulitis or sequela of recent diverticulitis. Correlate with any signs of LEFT lower quadrant pain or history of recent  diverticulitis. LEFT adnexal cyst with simple features slightly greater than 5 cm greatest dimension. Recommend follow-up US in 6-12 months.  UTD with colonoscopy 2018: diverticulosis,otherwise normal  Denies any GI symptoms Use of omeprazole prn  Back pain Per Ct 2022: Ovoid lesion slightly greater than 4 cm along the RIGHT paraspinous region may represent a nerve sheath tumor/schwannoma but is incompletely evaluated, grossly unchanged compared to imaging from 2020. Correlation with previous imaging if available may be helpful. If no previous imaging was performed could consider MRI for further evaluation on a nonemergent basis. Signs of spinal fusion are incidentally noted and incompletely visualized on the current study.  Adnexal cyst Per CT 04/2021: For clarification, the initial report states that the "adnexal cyst" is on the RIGHT in the body and on the LEFT in the conclusion. The finding, adnexal cyst, in the adnexa for which follow-up was suggested in 6-12 months with dedicated sonogram is on the RIGHT. Pelvic US 07/2021:Uterus 5.62 x 3.77 x 2.56 cm. EMS 4.17 mm.  No masses or thickening. Right ovary surgically absent. Right adnexal cyst 2.8 x 2.2 cm. Limited visualization due to overlying bowel gas. Left ovary not seen. No free fluid.  Exam limited due to overlying bowel gas.   Denies any pelvis pain.  Situational mixed anxiety and depressive disorder Acute an chronic, onset after sudden death of husband, worse since recent national election.  Agreed  to start zoloft 25mg  daily we discussed possible side effects. Advised to call 911 if an SI/HI/hallucinations. Advised to schedule appointment with therapist or use phone app-headspace. Advised to minimize exposure to political news. F/up in 70month  Past Medical History:  Diagnosis Date   Arthritis    Clostridium difficile colitis 2003   COVID 10/09/2022   Diverticulosis    GERD (gastroesophageal reflux disease)    Grief  01/01/2017   Hiatal hernia    Night terrors    Scoliosis    Past Surgical History:  Procedure Laterality Date   APPENDECTOMY  1987   Herrington rods  07/1979   RSO  1987   with appendectomy   Social History   Socioeconomic History   Marital status: Widowed    Spouse name: Not on file   Number of children: 2   Years of education: Not on file   Highest education level: Bachelor's degree (e.g., BA, AB, BS)  Occupational History   Not on file  Tobacco Use   Smoking status: Never   Smokeless tobacco: Never  Vaping Use   Vaping status: Never Used  Substance and Sexual Activity   Alcohol use: Not Currently    Comment: Occas   Drug use: No   Sexual activity: Not Currently    Partners: Male    Birth control/protection: Post-menopausal  Other Topics Concern   Not on file  Social History Narrative   Not on file   Social Determinants of Health   Financial Resource Strain: Low Risk  (08/14/2023)   Overall Financial Resource Strain (CARDIA)    Difficulty of Paying Living Expenses: Not very hard  Food Insecurity: No Food Insecurity (08/14/2023)   Hunger Vital Sign    Worried About Running Out of Food in the Last Year: Never true    Ran Out of Food in the Last Year: Never true  Transportation Needs: No Transportation Needs (08/14/2023)   PRAPARE - Administrator, Civil Service (Medical): No    Lack of Transportation (Non-Medical): No  Physical Activity: Insufficiently Active (08/14/2023)   Exercise Vital Sign    Days of Exercise per Week: 2 days    Minutes of Exercise per Session: 30 min  Stress: Stress Concern Present (08/14/2023)   [employer] of Occupational Health - Occupational Stress Questionnaire    Feeling of Stress : Rather much  Social Connections: Moderately Isolated (08/14/2023)   Social Connection and Isolation Panel [NHANES]    Frequency of Communication with Friends and Family: Once a week    Frequency of Social Gatherings with Friends and  Family: Once a week    Attends Religious Services: More than 4 times per year    Active Member of [employer] or Organizations: Yes    Attends Banker Meetings: More than 4 times per year    Marital Status: Widowed  Catering manager Violence: Not on file   Family Status  Relation Name Status   Mother  Alive   Father  Deceased   Brother  (Not Specified)   MGM  (Not Specified)   MGF  (Not Specified)   Neg Hx  (Not Specified)  No partnership data on file   Family History  Problem Relation Age of Onset   Breast cancer Mother 20       mastectomy   Hypothyroidism Mother    Hypertension Father    Skin cancer Brother        not melanoma   Lung cancer Maternal Grandmother  Diabetes Maternal Grandfather    Hypertension Maternal Grandfather    Stroke Maternal Grandfather    Colon cancer Neg Hx    Esophageal cancer Neg Hx    Liver cancer Neg Hx    Pancreatic cancer Neg Hx    Rectal cancer Neg Hx    Stomach cancer Neg Hx    Allergies  Allergen Reactions   Melatonin Other (See Comments)    Night terrors    Patient Care Team: Haylen Shelnutt, Bonna Gains, NP as PCP - General (Internal Medicine) Olivia Mackie, NP as Nurse Practitioner (Gynecology)   Medications: Outpatient Medications Prior to Visit  Medication Sig   Multiple Vitamin (MULTIVITAMIN) tablet Take 1 tablet by mouth daily.   omeprazole (PRILOSEC) 20 MG capsule TAKE 1 CAPSULE BY MOUTH EVERY DAY   CALCIUM-VITAMIN D PO Take by mouth. (Patient not taking: Reported on 08/15/2023)   [DISCONTINUED] benzonatate (TESSALON) 200 MG capsule Take 1 capsule (200 mg total) by mouth 2 (two) times daily as needed for cough. (Patient not taking: Reported on 07/16/2023)   [DISCONTINUED] fluticasone (FLONASE) 50 MCG/ACT nasal spray Place 2 sprays into both nostrils daily. (Patient not taking: Reported on 07/16/2023)   [DISCONTINUED] hyoscyamine (LEVSIN SL) 0.125 MG SL tablet Place 1 tablet (0.125 mg total) under the tongue every 6  (six) hours as needed (cramping/abdominal pain/diarrhea). (Patient not taking: Reported on 07/16/2023)   No facility-administered medications prior to visit.    Review of Systems  Constitutional:  Negative for activity change, appetite change and unexpected weight change.  Respiratory: Negative.    Cardiovascular: Negative.   Gastrointestinal: Negative.   Endocrine: Negative for cold intolerance and heat intolerance.  Genitourinary: Negative.   Musculoskeletal: Negative.   Skin: Negative.   Neurological: Negative.   Hematological: Negative.   Psychiatric/Behavioral:  Positive for agitation, dysphoric mood and sleep disturbance. Negative for behavioral problems, decreased concentration, hallucinations, self-injury and suicidal ideas. The patient is nervous/anxious.         Objective:  BP 114/79 (BP Location: Left Arm, Patient Position: Sitting, Cuff Size: Large)   Pulse 85   Temp 98.3 F (36.8 C) (Temporal)   Resp 18   Ht 5' 9.5" (1.765 m)   Wt 222 lb (100.7 kg)   LMP 10/19/2016 (Approximate)   SpO2 96%   BMI 32.31 kg/m     Physical Exam Vitals and nursing note reviewed.  Constitutional:      General: She is not in acute distress. HENT:     Right Ear: Tympanic membrane, ear canal and external ear normal.     Left Ear: Tympanic membrane, ear canal and external ear normal.     Nose: Nose normal.  Eyes:     Extraocular Movements: Extraocular movements intact.     Conjunctiva/sclera: Conjunctivae normal.     Pupils: Pupils are equal, round, and reactive to light.  Neck:     Thyroid: No thyroid mass, thyromegaly or thyroid tenderness.  Cardiovascular:     Rate and Rhythm: Normal rate and regular rhythm.     Pulses: Normal pulses.     Heart sounds: Normal heart sounds.  Pulmonary:     Effort: Pulmonary effort is normal.     Breath sounds: Normal breath sounds.  Abdominal:     General: Bowel sounds are normal.     Palpations: Abdomen is soft.  Musculoskeletal:         General: Normal range of motion.     Cervical back: Normal range of motion and neck supple.  Right lower leg: No edema.     Left lower leg: No edema.  Lymphadenopathy:     Cervical: No cervical adenopathy.  Skin:    General: Skin is warm and dry.  Neurological:     Mental Status: She is alert and oriented to person, place, and time.     Cranial Nerves: No cranial nerve deficit.  Psychiatric:        Attention and Perception: Attention normal.        Mood and Affect: Mood is anxious.        Speech: Speech normal.        Behavior: Behavior normal. Behavior is cooperative.        Thought Content: Thought content normal.        Cognition and Memory: Cognition and memory normal.        Judgment: Judgment normal.     Results for orders placed or performed in visit on 08/15/23  Comprehensive metabolic panel  Result Value Ref Range   Sodium 141 135 - 145 mEq/L   Potassium 4.3 3.5 - 5.1 mEq/L   Chloride 103 96 - 112 mEq/L   CO2 31 19 - 32 mEq/L   Glucose, Bld 88 70 - 99 mg/dL   BUN 14 6 - 23 mg/dL   Creatinine, Ser 1.61 0.40 - 1.20 mg/dL   Total Bilirubin 0.7 0.2 - 1.2 mg/dL   Alkaline Phosphatase 86 39 - 117 U/L   AST 14 0 - 37 U/L   ALT 14 0 - 35 U/L   Total Protein 6.8 6.0 - 8.3 g/dL   Albumin 4.6 3.5 - 5.2 g/dL   GFR 09.60 >45.40 mL/min   Calcium 9.8 8.4 - 10.5 mg/dL  Lipid panel  Result Value Ref Range   Cholesterol 198 0 - 200 mg/dL   Triglycerides 981.1 0.0 - 149.0 mg/dL   HDL 91.47 >82.95 mg/dL   VLDL 62.1 0.0 - 30.8 mg/dL   LDL Cholesterol 657 (H) 0 - 99 mg/dL   Total CHOL/HDL Ratio 3    NonHDL 130.53       Assessment & Plan:    Routine Health Maintenance and Physical Exam  Immunization History  Administered Date(s) Administered   Influenza, Seasonal, Injecte, Preservative Fre 06/23/2020   Influenza,inj,Quad PF,6+ Mos 07/01/2019, 07/19/2021, 05/29/2022   Influenza-Unspecified 06/18/2023   PFIZER(Purple Top)SARS-COV-2 Vaccination 11/14/2019, 12/06/2019,  07/03/2020, 04/02/2021, 06/02/2023   Pfizer(Comirnaty)Fall Seasonal Vaccine 12 years and older 06/26/2022   Tdap 09/07/2015   Zoster Recombinant(Shingrix) 09/16/2020, 12/03/2020    Health Maintenance  Topic Date Due   COVID-19 Vaccine (7 - 2023-24 season) 12/17/2023 (Originally 07/28/2023)   Hepatitis C Screening  08/14/2024 (Originally 05/30/1983)   HIV Screening  08/14/2024 (Originally 05/29/1980)   MAMMOGRAM  04/01/2025   DTaP/Tdap/Td (2 - Td or Tdap) 09/06/2025   Cervical Cancer Screening (HPV/Pap Cotest)  06/27/2026   Colonoscopy  04/07/2027   INFLUENZA VACCINE  Completed   Zoster Vaccines- Shingrix  Completed   HPV VACCINES  Aged Out   Discussed health benefits of physical activity, and encouraged her to engage in regular exercise appropriate for her age and condition.  Problem List Items Addressed This Visit     History of bowel infarction    Per CT 04/2021: Signs of omental infarct in the central abdomen, a potential cause of abdominal pain. Moderate hiatal hernia. Approximately 30% of the stomach is now herniated into the chest, increased from previous imaging from 2020. Mild thickening about the proximal stomach and esophagus  may reflect mild esophagogastritis. Stranding about the descending colon may represent mild diverticulitis or sequela of recent diverticulitis. Correlate with any signs of LEFT lower quadrant pain or history of recent diverticulitis. LEFT adnexal cyst with simple features slightly greater than 5 cm greatest dimension. Recommend follow-up US in 6-12 months.  UTD with colonoscopy 2018: diverticulosis,otherwise normal  Denies any GI symptoms Use of omeprazole prn      Night terrors    She realized this was triggered by use of melatonin after husband's death. She stopped med 19-Sep-2022. Denies any night terrors since then      Pure hypercholesterolemia   Relevant Orders   Lipid panel (Completed)   Situational mixed anxiety and depressive disorder    Acute  an chronic, onset after sudden death of husband, worse since recent national election.  Agreed to start zoloft 25mg  daily we discussed possible side effects. Advised to call 911 if an SI/HI/hallucinations. Advised to schedule appointment with therapist or use phone app-headspace. Advised to minimize exposure to political news. F/up in 53month      Relevant Medications   sertraline (ZOLOFT) 25 MG tablet   Other Visit Diagnoses     Encounter for preventative adult health care exam with abnormal findings    -  Primary   Relevant Orders   Comprehensive metabolic panel (Completed)   Asymptomatic postmenopausal estrogen deficiency       Relevant Orders   DG Bone Density      Return in about 4 weeks (around 09/12/2023) for depression and anxiety (video).     Alysia Penna, NP

## 2023-08-15 NOTE — Assessment & Plan Note (Addendum)
Per CT 04/2021: Signs of omental infarct in the central abdomen, a potential cause of abdominal pain. Moderate hiatal hernia. Approximately 30% of the stomach is now herniated into the chest, increased from previous imaging from 2020. Mild thickening about the proximal stomach and esophagus may reflect mild esophagogastritis. Stranding about the descending colon may represent mild diverticulitis or sequela of recent diverticulitis. Correlate with any signs of LEFT lower quadrant pain or history of recent diverticulitis. LEFT adnexal cyst with simple features slightly greater than 5 cm greatest dimension. Recommend follow-up US in 6-12 months.  UTD with colonoscopy 2018: diverticulosis,otherwise normal  Denies any GI symptoms Use of omeprazole prn

## 2023-08-15 NOTE — Patient Instructions (Signed)
Go to lab Continue Heart healthy diet and daily exercise. Start 1/2 tablet once daily for 1 week and then increase to a full tablet once daily continuously  Plan follow up in 1 month to evaluate progress.    Consider use of app Headspace  Managing Anxiety, Adult After being diagnosed with anxiety, you may be relieved to know why you have felt or behaved a certain way. You may also feel overwhelmed about the treatment ahead and what it will mean for your life. With care and support, you can manage your anxiety. How to manage lifestyle changes Understanding the difference between stress and anxiety Although stress can play a role in anxiety, it is not the same as anxiety. Stress is your body's reaction to life changes and events, both good and bad. Stress is often caused by something external, such as a deadline, test, or competition. It normally goes away after the event has ended and will last just a few hours. But, stress can be ongoing and can lead to more than just stress. Anxiety is caused by something internal, such as imagining a terrible outcome or worrying that something will go wrong that will greatly upset you. Anxiety often does not go away even after the event is over, and it can become a long-term (chronic) worry. Lowering stress and anxiety Talk with your health care provider or a counselor to learn more about lowering anxiety and stress. They may suggest tension-reduction techniques, such as: Music. Spend time creating or listening to music that you enjoy and that inspires you. Mindfulness-based meditation. Practice being aware of your normal breaths while not trying to control your breathing. It can be done while sitting or walking. Centering prayer. Focus on a word, phrase, or sacred image that means something to you and brings you peace. Deep breathing. Expand your stomach and inhale slowly through your nose. Hold your breath for 3-5 seconds. Then breathe out slowly, letting your  stomach muscles relax. Self-talk. Learn to notice and spot thought patterns that lead to anxiety reactions. Change those patterns to thoughts that feel peaceful. Muscle relaxation. Take time to tense muscles and then relax them. Choose a tension-reduction technique that fits your lifestyle and personality. These techniques take time and practice. Set aside 5-15 minutes a day to do them. Specialized therapists can offer counseling and training in these techniques. The training to help with anxiety may be covered by some insurance plans. Other things you can do to manage stress and anxiety include: Keeping a stress diary. This can help you learn what triggers your reaction and then learn ways to manage your response. Thinking about how you react to certain situations. You may not be able to control everything, but you can control your response. Making time for activities that help you relax and not feeling guilty about spending your time in this way. Doing visual imagery. This involves imagining or creating mental pictures to help you relax. Practicing yoga. Through yoga poses, you can lower tension and relax.  Medicines Medicines for anxiety include: Antidepressant medicines. These are usually prescribed for long-term daily control. Anti-anxiety medicines. These may be added in severe cases, especially when panic attacks occur. When used together, medicines, psychotherapy, and tension-reduction techniques may be the most effective treatment. Relationships Relationships can play a big part in helping you recover. Spend more time connecting with trusted friends and family members. Think about going to couples counseling if you have a partner, taking family education classes, or going to family therapy.  Therapy can help you and others better understand your anxiety. How to recognize changes in your anxiety Everyone responds differently to treatment for anxiety. Recovery from anxiety happens when  symptoms lessen and stop interfering with your daily life at home or work. This may mean that you will start to: Have better concentration and focus. Worry will interfere less in your daily thinking. Sleep better. Be less irritable. Have more energy. Have improved memory. Try to recognize when your condition is getting worse. Contact your provider if your symptoms interfere with home or work and you feel like your condition is not improving. Follow these instructions at home: Activity Exercise. Adults should: Exercise for at least 150 minutes each week. The exercise should increase your heart rate and make you sweat (moderate-intensity exercise). Do strengthening exercises at least twice a week. Get the right amount and quality of sleep. Most adults need 7-9 hours of sleep each night. Lifestyle  Eat a healthy diet that includes plenty of vegetables, fruits, whole grains, low-fat dairy products, and lean protein. Do not eat a lot of foods that are high in fats, added sugars, or salt (sodium). Make choices that simplify your life. Do not use any products that contain nicotine or tobacco. These products include cigarettes, chewing tobacco, and vaping devices, such as e-cigarettes. If you need help quitting, ask your provider. Avoid caffeine, alcohol, and certain over-the-counter cold medicines. These may make you feel worse. Ask your pharmacist which medicines to avoid. General instructions Take over-the-counter and prescription medicines only as told by your provider. Keep all follow-up visits. This is to make sure you are managing your anxiety well or if you need more support. Where to find support You can get help and support from: Self-help groups. Online and [employer]. A trusted spiritual leader. Couples counseling. Family education classes. Family therapy. Where to find more information You may find that joining a support group helps you deal with your anxiety. The  following sources can help you find counselors or support groups near you: Mental Health America: mentalhealthamerica.net Anxiety and Depression Association of [place] (ADAA): adaa.org The [employer] on Mental Illness (NAMI): nami.org Contact a health care provider if: You have a hard time staying focused or finishing tasks. You spend many hours a day feeling worried about everyday life. You are very tired because you cannot stop worrying. You start to have headaches or often feel tense. You have chronic nausea or diarrhea. Get help right away if: Your heart feels like it is racing. You have shortness of breath. You have thoughts of hurting yourself or others. Get help right away if you feel like you may hurt yourself or others, or have thoughts about taking your own life. Go to your nearest emergency room or: Call 911. Call the National Suicide Prevention Lifeline at 860 643 0045 or 988. This is open 24 hours a day. Text the Crisis Text Line at 360-406-6834. This information is not intended to replace advice given to you by your health care provider. Make sure you discuss any questions you have with your health care provider. Document Revised: 06/13/2022 Document Reviewed: 12/26/2020 Elsevier Patient Education  2024 [employer].

## 2023-08-15 NOTE — Assessment & Plan Note (Deleted)
Per Ct 2022: Ovoid lesion slightly greater than 4 cm along the RIGHT paraspinous region may represent a nerve sheath tumor/schwannoma but is incompletely evaluated, grossly unchanged compared to imaging from 2020. Correlation with previous imaging if available may be helpful. If no previous imaging was performed could consider MRI for further evaluation on a nonemergent basis.

## 2023-08-15 NOTE — Assessment & Plan Note (Signed)
We discussed need for lumbar spine MRI. She states back pain is chronic and intermittent with certai positions. There has been no change. She decline need for MRI at this time.

## 2023-08-15 NOTE — Assessment & Plan Note (Signed)
Acute an chronic, onset after sudden death of husband, worse since recent national election.  Agreed to start zoloft 25mg  daily we discussed possible side effects. Advised to call 911 if an SI/HI/hallucinations. Advised to schedule appointment with therapist or use phone app-headspace. Advised to minimize exposure to political news. F/up in 88month

## 2023-08-15 NOTE — Telephone Encounter (Signed)
Need to discus previous CT lumbar spine and need for possible need for MRI?

## 2023-08-15 NOTE — Assessment & Plan Note (Signed)
Per Ct 2022: Ovoid lesion slightly greater than 4 cm along the RIGHT paraspinous region may represent a nerve sheath tumor/schwannoma but is incompletely evaluated, grossly unchanged compared to imaging from 2020. Correlation with previous imaging if available may be helpful. If no previous imaging was performed could consider MRI for further evaluation on a nonemergent basis. Signs of spinal fusion are incidentally noted and incompletely visualized on the current study.

## 2023-08-15 NOTE — Assessment & Plan Note (Signed)
She realized this was triggered by use of melatonin after husband's death. She stopped med September 27, 2022. Denies any night terrors since then

## 2023-08-17 ENCOUNTER — Encounter: Payer: Self-pay | Admitting: Nurse Practitioner

## 2023-09-02 ENCOUNTER — Other Ambulatory Visit: Payer: Self-pay | Admitting: Nurse Practitioner

## 2023-09-02 DIAGNOSIS — K219 Gastro-esophageal reflux disease without esophagitis: Secondary | ICD-10-CM

## 2023-09-14 ENCOUNTER — Encounter: Payer: Self-pay | Admitting: Nurse Practitioner

## 2023-09-20 ENCOUNTER — Telehealth: Payer: BC Managed Care – PPO | Admitting: Nurse Practitioner

## 2023-11-16 ENCOUNTER — Encounter: Payer: Self-pay | Admitting: Nurse Practitioner

## 2023-11-16 DIAGNOSIS — Z0184 Encounter for antibody response examination: Secondary | ICD-10-CM

## 2024-02-18 ENCOUNTER — Other Ambulatory Visit: Payer: Self-pay | Admitting: Nurse Practitioner

## 2024-02-18 DIAGNOSIS — Z1231 Encounter for screening mammogram for malignant neoplasm of breast: Secondary | ICD-10-CM

## 2024-03-01 ENCOUNTER — Other Ambulatory Visit: Payer: Self-pay | Admitting: Nurse Practitioner

## 2024-03-01 DIAGNOSIS — K219 Gastro-esophageal reflux disease without esophagitis: Secondary | ICD-10-CM

## 2024-03-19 ENCOUNTER — Encounter: Payer: Self-pay | Admitting: Nurse Practitioner

## 2024-03-19 ENCOUNTER — Ambulatory Visit: Admitting: Nurse Practitioner

## 2024-03-19 ENCOUNTER — Ambulatory Visit: Payer: Self-pay | Admitting: Nurse Practitioner

## 2024-03-19 VITALS — BP 120/74 | HR 76 | Ht 70.0 in | Wt 217.8 lb

## 2024-03-19 DIAGNOSIS — M5441 Lumbago with sciatica, right side: Secondary | ICD-10-CM | POA: Diagnosis not present

## 2024-03-19 DIAGNOSIS — R5383 Other fatigue: Secondary | ICD-10-CM

## 2024-03-19 DIAGNOSIS — F4323 Adjustment disorder with mixed anxiety and depressed mood: Secondary | ICD-10-CM

## 2024-03-19 DIAGNOSIS — G8929 Other chronic pain: Secondary | ICD-10-CM | POA: Diagnosis not present

## 2024-03-19 DIAGNOSIS — R0609 Other forms of dyspnea: Secondary | ICD-10-CM

## 2024-03-19 LAB — CBC
HCT: 41.9 % (ref 36.0–46.0)
Hemoglobin: 13.6 g/dL (ref 12.0–15.0)
MCHC: 32.5 g/dL (ref 30.0–36.0)
MCV: 85.3 fl (ref 78.0–100.0)
Platelets: 240 10*3/uL (ref 150.0–400.0)
RBC: 4.92 Mil/uL (ref 3.87–5.11)
RDW: 13.2 % (ref 11.5–15.5)
WBC: 6.5 10*3/uL (ref 4.0–10.5)

## 2024-03-19 LAB — TSH: TSH: 0.78 u[IU]/mL (ref 0.35–5.50)

## 2024-03-19 LAB — VITAMIN D 25 HYDROXY (VIT D DEFICIENCY, FRACTURES): VITD: 43.3 ng/mL (ref 30.00–100.00)

## 2024-03-19 LAB — SEDIMENTATION RATE: Sed Rate: 13 mm/h (ref 0–30)

## 2024-03-19 LAB — CK: Total CK: 87 U/L (ref 7–177)

## 2024-03-19 LAB — VITAMIN B12: Vitamin B-12: 426 pg/mL (ref 211–911)

## 2024-03-19 LAB — C-REACTIVE PROTEIN: CRP: <1 mg/dL (ref 0.5–20.0)

## 2024-03-19 NOTE — Patient Instructions (Signed)
 Go to lab You will be contacted to schedule appointment with echocardiogram and MRI Continue use of ibuprofen   or voltaren gel or lidocaine patch as needed

## 2024-03-19 NOTE — Assessment & Plan Note (Signed)
 Unable to tolerate zoloft  25mg : nausea. Opted not to take any additional med at this time

## 2024-03-19 NOTE — Assessment & Plan Note (Addendum)
 Persistent lower pack pain, gradually worsening, denies any new symptoms. Worse with lifting, twisting, or supine position. Interfers with sleep. No improvement with tylenol  moderate improvement with ibuprofen  prn Completed PT in 2019 with some improvement. CT 2022: Paraspinal lesion as described unchanged with respect to visualized portions dating back to April of 2020 but incompletely imaged on previous imaging. Signs of spinal fusion as before, incompletely imaged. Residual spinal curvature with similar appearance to prior imaging as well.  She agreed to MRI lumbar spine. Consider ref PT if no acute finding on MRI

## 2024-03-19 NOTE — Progress Notes (Signed)
 Established Patient Visit  Patient: Amber Green   DOB: 04-22-1966   58 y.o. Female  MRN: [id number] Visit Date: 03/19/2024  Subjective:    Chief Complaint  Patient presents with   Fatigue    Fatigue to the point of exhaustion at times, no energy and lower back back    HPI  Fatigue Onset 3months ago, associated with daytime somnolence and SOB with minimal exertion (cleaning her home, walking in the park with friends). Denies any CP or dizziness or palpitation. Hx of snoring with AM headache. Unknown apneic episodes. Sleep 9hrs each night, uninterrupted. No known CAD, no tobacco use, no HYPERTENSION or DIABETES Hx of hypercholesteremia  OBSTRUCTIVE SLEEP APNEA vs cardiac etiology. Check CBC, THYROID, vit. D, Iron, B12, ESR, CK, and CRP ECG obtained today: NSR, no change compared to 2020 Ordered echocardiogram/stress test and seep study if normal lab results  Situational mixed anxiety and depressive disorder Unable to tolerate zoloft  25mg : nausea. Opted not to take any additional med at this time  Back pain Persistent lower pack pain, gradually worsening, denies any new symptoms. Worse with lifting, twisting, or supine position. Interfers with sleep. No improvement with tylenol  moderate improvement with ibuprofen  prn Completed PT in 2019 with some improvement. CT 2022: Paraspinal lesion as described unchanged with respect to visualized portions dating back to April of 2020 but incompletely imaged on previous imaging. Signs of spinal fusion as before, incompletely imaged. Residual spinal curvature with similar appearance to prior imaging as well.  She agreed to MRI lumbar spine. Consider ref PT if no acute finding on MRI  Reviewed medical, surgical, and social history today  Medications: Outpatient Medications Prior to Visit  Medication Sig   CALCIUM-VITAMIN D  PO Take by mouth.   Multiple Vitamin (MULTIVITAMIN) tablet Take 1 tablet by mouth daily.    omeprazole  (PRILOSEC) 20 MG capsule TAKE 1 CAPSULE BY MOUTH EVERY DAY   sertraline  (ZOLOFT ) 25 MG tablet Take 1 tablet (25 mg total) by mouth daily. (Patient not taking: Reported on 03/19/2024)   No facility-administered medications prior to visit.   Reviewed past medical and social history.   ROS per HPI above      Objective:  BP 120/74 (BP Location: Left Arm, Patient Position: Sitting, Cuff Size: Normal)   Pulse 76   Ht 5' 10 (1.778 m)   Wt 217 lb 12.8 oz (98.8 kg)   LMP 10/19/2016 (Approximate)   SpO2 97%   BMI 31.25 kg/m      Physical Exam Vitals and nursing note reviewed.  Cardiovascular:     Rate and Rhythm: Normal rate and regular rhythm.     Pulses: Normal pulses.     Heart sounds: Normal heart sounds.  Pulmonary:     Effort: Pulmonary effort is normal.     Breath sounds: Normal breath sounds.  Neurological:     Mental Status: She is alert and oriented to person, place, and time.  Psychiatric:        Mood and Affect: Mood normal.        Behavior: Behavior normal.        Thought Content: Thought content normal.     No results found for any visits on 03/19/24.    Assessment & Plan:    Problem List Items Addressed This Visit     Back pain   Persistent lower pack pain, gradually worsening, denies any new symptoms. Worse with  lifting, twisting, or supine position. Interfers with sleep. No improvement with tylenol  moderate improvement with ibuprofen  prn Completed PT in 2019 with some improvement. CT 2022: Paraspinal lesion as described unchanged with respect to visualized portions dating back to April of 2020 but incompletely imaged on previous imaging. Signs of spinal fusion as before, incompletely imaged. Residual spinal curvature with similar appearance to prior imaging as well.  She agreed to MRI lumbar spine. Consider ref PT if no acute finding on MRI      Relevant Orders   MR LUMBAR SPINE W WO CONTRAST   Fatigue - Primary   Onset 3months ago,  associated with daytime somnolence and SOB with minimal exertion (cleaning her home, walking in the park with friends). Denies any CP or dizziness or palpitation. Hx of snoring with AM headache. Unknown apneic episodes. Sleep 9hrs each night, uninterrupted. No known CAD, no tobacco use, no HYPERTENSION or DIABETES Hx of hypercholesteremia  OBSTRUCTIVE SLEEP APNEA vs cardiac etiology. Check CBC, THYROID, vit. D, Iron, B12, ESR, CK, and CRP ECG obtained today: NSR, no change compared to 2020 Ordered echocardiogram/stress test and seep study if normal lab results      Relevant Orders   EKG 12-Lead (Completed)   CBC   B12   VITAMIN D  25 Hydroxy (Vit-D Deficiency, Fractures)   TSH   Sedimentation rate   CK   C-reactive protein   Situational mixed anxiety and depressive disorder   Unable to tolerate zoloft  25mg : nausea. Opted not to take any additional med at this time      Return if symptoms worsen or fail to improve.     Roselie Mood, NP

## 2024-03-19 NOTE — Assessment & Plan Note (Addendum)
 Onset 3months ago, associated with daytime somnolence and SOB with minimal exertion (cleaning her home, walking in the park with friends). Denies any CP or dizziness or palpitation. Hx of snoring with AM headache. Unknown apneic episodes. Sleep 9hrs each night, uninterrupted. No known CAD, no tobacco use, no HYPERTENSION or DIABETES Hx of hypercholesteremia  OBSTRUCTIVE SLEEP APNEA vs cardiac etiology. Check CBC, THYROID, vit. D, Iron, B12, ESR, CK, and CRP ECG obtained today: NSR, no change compared to 2020 Ordered echocardiogram/stress test and seep study if normal lab results

## 2024-03-24 ENCOUNTER — Encounter: Payer: Self-pay | Admitting: Nurse Practitioner

## 2024-03-24 ENCOUNTER — Ambulatory Visit (HOSPITAL_COMMUNITY)
Admission: RE | Admit: 2024-03-24 | Discharge: 2024-03-24 | Disposition: A | Discharge status: Home / Self Care | Source: Ambulatory Visit | Attending: Cardiology | Admitting: Cardiology

## 2024-03-24 DIAGNOSIS — R0609 Other forms of dyspnea: Secondary | ICD-10-CM | POA: Insufficient documentation

## 2024-03-24 DIAGNOSIS — R5383 Other fatigue: Secondary | ICD-10-CM | POA: Insufficient documentation

## 2024-03-24 LAB — ECHOCARDIOGRAM COMPLETE
Area-P 1/2: 4.74 cm2
S' Lateral: 2.3 cm

## 2024-03-25 ENCOUNTER — Ambulatory Visit: Payer: Self-pay | Admitting: Nurse Practitioner

## 2024-03-25 ENCOUNTER — Other Ambulatory Visit: Payer: Self-pay | Admitting: Nurse Practitioner

## 2024-03-25 DIAGNOSIS — R5383 Other fatigue: Secondary | ICD-10-CM

## 2024-03-25 DIAGNOSIS — R0683 Snoring: Secondary | ICD-10-CM

## 2024-03-25 DIAGNOSIS — R400 Somnolence: Secondary | ICD-10-CM

## 2024-03-25 DIAGNOSIS — I517 Cardiomegaly: Secondary | ICD-10-CM

## 2024-03-25 DIAGNOSIS — R0609 Other forms of dyspnea: Secondary | ICD-10-CM

## 2024-04-02 ENCOUNTER — Encounter: Payer: Self-pay | Admitting: Nurse Practitioner

## 2024-04-02 ENCOUNTER — Ambulatory Visit
Admission: RE | Admit: 2024-04-02 | Discharge: 2024-04-02 | Disposition: A | Discharge status: Home / Self Care | Payer: Self-pay | Source: Ambulatory Visit | Attending: Nurse Practitioner | Admitting: Nurse Practitioner

## 2024-04-02 DIAGNOSIS — Z1231 Encounter for screening mammogram for malignant neoplasm of breast: Secondary | ICD-10-CM

## 2024-04-09 ENCOUNTER — Encounter: Payer: Self-pay | Admitting: Nurse Practitioner

## 2024-04-11 ENCOUNTER — Other Ambulatory Visit: Payer: BC Managed Care – PPO

## 2024-04-17 ENCOUNTER — Telehealth (HOSPITAL_COMMUNITY): Payer: Self-pay | Admitting: *Deleted

## 2024-04-17 ENCOUNTER — Encounter (HOSPITAL_COMMUNITY): Payer: Self-pay | Admitting: *Deleted

## 2024-04-17 NOTE — Telephone Encounter (Signed)
 ETT instructions via mail [employer].

## 2024-04-24 ENCOUNTER — Ambulatory Visit (INDEPENDENT_AMBULATORY_CARE_PROVIDER_SITE_OTHER): Admitting: Neurology

## 2024-04-24 ENCOUNTER — Encounter: Payer: Self-pay | Admitting: Neurology

## 2024-04-24 VITALS — BP 110/68 | HR 84 | Ht 70.0 in | Wt 218.6 lb

## 2024-04-24 DIAGNOSIS — R0683 Snoring: Secondary | ICD-10-CM

## 2024-04-24 DIAGNOSIS — Z6831 Body mass index (BMI) 31.0-31.9, adult: Secondary | ICD-10-CM

## 2024-04-24 DIAGNOSIS — R5383 Other fatigue: Secondary | ICD-10-CM | POA: Diagnosis not present

## 2024-04-24 DIAGNOSIS — G478 Other sleep disorders: Secondary | ICD-10-CM

## 2024-04-24 DIAGNOSIS — Z8739 Personal history of other diseases of the musculoskeletal system and connective tissue: Secondary | ICD-10-CM

## 2024-04-24 NOTE — Patient Instructions (Addendum)
 ASSESSMENT AND PLAN 58 y.o. year old widowed  female  here with:   Remarkable history of increasing fatigue with worsening actually after nocturnal sleep.  The patient describes that after life of not having trouble getting up in the morning she is now feeling that she is never refreshed, never restored enough to initiate the day.  If she would not set an alarm she can sleep many hours longer but does not necessarily feel more refreshed even then.  She has described fatigue to the level where it is almost painful to walk to the car or from the car to the office door.  She is not necessarily sore but she feels a sudden weakness as if she has no power over her body. There has been no recent surgery especially nothing was prolonged anesthesia, also she lost her husband suddenly and unexpectedly several years ago she has worked through her grief and she does not feel depressed or highly anxious at this time.   There has been no evidence of any inflammatory or infectious disease, no febrile illness swelling of lymph nodes etc.  She is pending workup by cardiac stress test. She had covid in January 2024.    My goal is to make sure that this patient does not have sleep apnea but I am thinking that there may be other causes for her nonrestorative sleep quality.  So while I am ordering first a sleep test this could be a home sleep test or an in-lab sleep test I do want to make sure that this test will look at hypoxia low oxygen levels at night.  Not every home sleep test is able to correlate hypoxia data to sleep stages or sleep position which would be important in this case.  In addition I would encourage a whole rheumatological workup.             1) snoring, fatigue, non restorative sleep    HST        I plan to follow up prn from HST either personally or through our NP within 4-5  months.   Healthy Living: Sleep In this video, you will learn why sleep is an important part of a healthy  lifestyle. To view the content, go to this web address: [email or web address]  This video will expire on: 08/29/2025. If you need access to this video following this date, please reach out to the healthcare provider who assigned it to you. This information is not intended to replace advice given to you by your health care provider. Make sure you discuss any questions you have with your health care provider. Elsevier Patient Education  2024 [employer].

## 2024-04-24 NOTE — Progress Notes (Signed)
 SLEEP MEDICINE CLINIC    Provider:  Dedra Gores, MD  Primary Care Physician:  Katheen Roselie Rockford, NP [address] [place] [address]     Referring Provider: Katheen Roselie Rockford, Np [address] [place],  [place] [address]          Chief Complaint according to patient   Patient presents with:     New Patient (Initial Visit)           HISTORY OF PRESENT ILLNESS:  Amber Green is a 58 y.o. female patient who is seen upon referral  by Dr Katheen on 04/24/2024  for increasing exhaustion since last fall 2024.   Chief concern according to patient :   my stamina is low, my feeling of exhaustion is high, I have not been taking naps ever, but now I need to sleep for an hour , I am too tired to move just to the car or from the car to the office door, have  a bit faster heart rate ( exercise stress test is pending )   Oddly, exercise like my Zoomba class doesn't bring on my fatigue.  I wake up  not restored neither refreshed even if I slept 9 hours, its hard to wake up in AM.  . My muscles don't get sore they are just weak.        Sleep relevant medical history: Nocturia 1-2, No ENT surgery- no cervical spine surgery, no TBI.  Harrington rod for scoliosis, surgery at age 2.  after her husband s death , melatonin  induced night terrors.     Family medical /sleep history: no other family member on CPAP with OSA, mother alive at age 33 , cancer .   Father passed at age 32 and died of unknown causes in [place]  , had CAD, PAD, mobility issues, falls.    Social history:  Patient is widowed for 6 years ,  working as a Market researcher,  and works for [employer],  and lives in a household with 2 children 13 and 41.. Family status is widowed , with 2 children, no grandchildren.  The patient currently works now regular hours, 4 ,11 hour days ,  used to work in shifts( night/ rotating,) Tobacco use; .  none  ETOH use ; rare . 1 beer on a WE, , Caffeine  intake in form of Coffee( 1 6-8 ounce cup in AM ) Soda(  8 ounces coke ), nothing after lunch.  Exercise in form of Zoomba , gym or group class , 3-4 times a week. .   Hobbies :gardening.       Sleep habits are as follows: The patient's dinner time is between 6-7 PM. The patient goes to bed at 10 PM and continues to sleep for 6-7 hours, wakes for 1-2 bathroom breaks.   The preferred sleep position is lateral or supine , with the support of 1 pillow on an adjusted frame. .  Dreams are reportedly frequent/vivid dreams after her husband's death , melatonin  induced night terrors. .   The patient wakes up spontaneously later than she used to- needs now an 5 AM  alarm. 5  AM is the usual rise time. She reports not feeling refreshed or restored in AM, with symptoms such as dry mouth,and residual fatigue.  Nap  are taken infrequently,  there is little opportunity,  otherwise would like to nap- after lunch- lasting from 45 to 60 minutes .  Review of Systems: Out of a complete 14 system review, the patient complains of only the following symptoms, and all other reviewed systems are negative.:  Fatigue, sleepiness , snoring, fragmented sleep, Insomnia,   How likely are you to doze in the following situations: 0 = not likely, 1 = slight chance, 2 = moderate chance, 3 = high chance   Sitting and Reading? Watching Television? Sitting inactive in a public place (theater or meeting)? As a passenger in a car for an hour without a break? Lying down in the afternoon when circumstances permit? Sitting and talking to someone? Sitting quietly after lunch without alcohol? In a car, while stopped for a few minutes in traffic?   Total = 13/ 24 points   FSS endorsed at 39/ 63 points.    Social History   Socioeconomic History   Marital status: Widowed    Spouse name: Not on file   Number of children: 2   Years of education: Not on file   Highest education level: Bachelor's degree (e.g., BA, AB, BS)   Occupational History   Not on file  Tobacco Use   Smoking status: Never   Smokeless tobacco: Never  Vaping Use   Vaping status: Never Used  Substance and Sexual Activity   Alcohol use: Not Currently    Comment: On weekends occasionally   Drug use: No   Sexual activity: Not Currently    Partners: Male    Birth control/protection: Post-menopausal  Other Topics Concern   Not on file  Social History Narrative   Not on file   Social Drivers of Health   Financial Resource Strain: Low Risk  (08/14/2023)   Overall Financial Resource Strain (CARDIA)    Difficulty of Paying Living Expenses: Not very hard  Food Insecurity: No Food Insecurity (08/14/2023)   Hunger Vital Sign    Worried About Running Out of Food in the Last Year: Never true    Ran Out of Food in the Last Year: Never true  Transportation Needs: No Transportation Needs (08/14/2023)   PRAPARE - Administrator, Civil Service (Medical): No    Lack of Transportation (Non-Medical): No  Physical Activity: Insufficiently Active (08/14/2023)   Exercise Vital Sign    Days of Exercise per Week: 2 days    Minutes of Exercise per Session: 30 min  Stress: Stress Concern Present (08/14/2023)   [employer] of Occupational Health - Occupational Stress Questionnaire    Feeling of Stress : Rather much  Social Connections: Moderately Isolated (08/14/2023)   Social Connection and Isolation Panel    Frequency of Communication with Friends and Family: Once a week    Frequency of Social Gatherings with Friends and Family: Once a week    Attends Religious Services: More than 4 times per year    Active Member of [employer] or Organizations: Yes    Attends Banker Meetings: More than 4 times per year    Marital Status: Widowed    Family History  Problem Relation Age of Onset   Breast cancer Mother 57       mastectomy   Hypothyroidism Mother    Hypertension Father    Skin cancer Brother        not melanoma    Lung cancer Maternal Grandmother    Diabetes Maternal Grandfather    Hypertension Maternal Grandfather    Stroke Maternal Grandfather    Colon cancer Neg Hx    Esophageal cancer Neg Hx  Liver cancer Neg Hx    Pancreatic cancer Neg Hx    Rectal cancer Neg Hx    Stomach cancer Neg Hx     Past Medical History:  Diagnosis Date   Arthritis    Clostridium difficile colitis 2003   COVID 10/09/2022   Diverticulosis    GERD (gastroesophageal reflux disease)    Grief 01/01/2017   Hiatal hernia    Night terrors    Scoliosis     Past Surgical History:  Procedure Laterality Date   APPENDECTOMY  1987   Herrington rods  07/1979   RSO  1987   with appendectomy     Current Outpatient Medications on File Prior to Visit  Medication Sig Dispense Refill   CALCIUM-VITAMIN D  PO Take by mouth. (Patient taking differently: Take 1 tablet by mouth as needed (Takes for a week then off for 2 weeks).)     Multiple Vitamin (MULTIVITAMIN) tablet Take 1 tablet by mouth daily.     omeprazole  (PRILOSEC) 20 MG capsule TAKE 1 CAPSULE BY MOUTH EVERY DAY 90 capsule 1   No current facility-administered medications on file prior to visit.    Allergies  Allergen Reactions   Melatonin Other (See Comments)    Night terrors     DIAGNOSTIC DATA (LABS, IMAGING, TESTING) - I reviewed patient records, labs, notes, testing and imaging myself where available.  Lab Results  Component Value Date   WBC 6.5 03/19/2024   HGB 13.6 03/19/2024   HCT 41.9 03/19/2024   MCV 85.3 03/19/2024   PLT 240.0 03/19/2024      Component Value Date/Time   NA 141 08/15/2023 0929   K 4.3 08/15/2023 0929   CL 103 08/15/2023 0929   CO2 31 08/15/2023 0929   GLUCOSE 88 08/15/2023 0929   BUN 14 08/15/2023 0929   CREATININE 0.91 08/15/2023 0929   CREATININE 0.91 07/05/2022 0831   CALCIUM 9.8 08/15/2023 0929   PROT 6.8 08/15/2023 0929   ALBUMIN 4.6 08/15/2023 0929   AST 14 08/15/2023 0929   ALT 14 08/15/2023 0929    ALKPHOS 86 08/15/2023 0929   BILITOT 0.7 08/15/2023 0929   GFRNONAA >60 05/17/2021 1659   GFRAA >60 01/14/2019 2022   Lab Results  Component Value Date   CHOL 198 08/15/2023   HDL 67.50 08/15/2023   LDLCALC 108 (H) 08/15/2023   TRIG 112.0 08/15/2023   CHOLHDL 3 08/15/2023   Lab Results  Component Value Date   HGBA1C 5.2 04/11/2019   Lab Results  Component Value Date   VITAMINB12 426 03/19/2024   Lab Results  Component Value Date   TSH 0.78 03/19/2024    PHYSICAL EXAM:  Today's Vitals   04/24/24 1459  BP: 110/68  Pulse: 84  Weight: 218 lb 9.6 oz (99.2 kg)  Height: 5' 10 (1.778 m)   Body mass index is 31.37 kg/m.   Wt Readings from Last 3 Encounters:  04/24/24 218 lb 9.6 oz (99.2 kg)  03/19/24 217 lb 12.8 oz (98.8 kg)  08/15/23 222 lb (100.7 kg)     Ht Readings from Last 3 Encounters:  04/24/24 5' 10 (1.778 m)  03/19/24 5' 10 (1.778 m)  08/15/23 5' 9.5 (1.765 m)      General: The patient is awake, alert and appears not in acute distress. The patient is well groomed. Head: Normocephalic, atraumatic. Neck is supple.  Mallampati 2,  neck circumference:14. 65  inches . Nasal airflow  patent.  Retrognathia is seen.  Dental status: crowded  lower jaw, biological  Cardiovascular:  Regular rate and cardiac rhythm by pulse,  without distended neck veins. Respiratory: Lungs are clear to auscultation.  Skin:  Without evidence of ankle edema, or rash. Trunk: The patient's posture is erect.   NEUROLOGIC EXAM: The patient is awake and alert, oriented to place and time.   Memory subjective described as intact.  Attention span & concentration ability appears normal.  Speech is fluent,  without  dysarthria, dysphonia or aphasia.  Mood and affect are appropriate.   Cranial nerves: no loss of smell or taste reported  Pupils are equal and briskly reactive to light.  Funduscopic exam deferred. .  Extraocular movements in vertical and horizontal planes were intact  and without nystagmus. No Diplopia. Visual fields by finger perimetry are intact. Hearing loss on the right eye- t to soft voice .   Facial sensation intact to fine touch. Facial motor strength is symmetric and tongue and uvula move midline.  Neck ROM : rotation, tilt and flexion extension were normal for age and shoulder shrug was symmetrical.    Motor exam:  Symmetric bulk, tone and ROM.   Normal tone without cog wheeling, symmetric grip strength .  Sensory:   normal.  Proprioception tested in the upper extremities was normal. Coordination: Rapid alternating movements in the fingers/hands were of normal speed.  The Finger-to-nose maneuver was intact without evidence of ataxia, dysmetria or tremor.   Gait and station: Patient could rise unassisted from a seated position, walked without assistive device.  Stance is of normal width/ base and the patient turned with 3 steps.  Toe and heel walk were deferred.  Deep tendon reflexes: in the  upper and lower extremities are symmetric and intact.  Babinski response was deferred     ASSESSMENT AND PLAN 58 y.o. year old widowed  female  here with:  Remarkable history of increasing fatigue with worsening actually after nocturnal sleep.  The patient describes that after life of not having trouble getting up in the morning she is now feeling that she is never refreshed, never restored enough to initiate the day.  If she would not set an alarm she can sleep many hours longer but does not necessarily feel more refreshed even then.  She has described fatigue to the level where it is almost painful to walk to the car or from the car to the office door.  She is not necessarily sore but she feels a sudden weakness as if she has no power over her body. There has been no recent surgery especially nothing was prolonged anesthesia, also she lost her husband suddenly and unexpectedly several years ago she has worked through her grief and she does not feel depressed  or highly anxious at this time.  There has been no evidence of any inflammatory or infectious disease, no febrile illness swelling of lymph nodes etc.  She is pending workup by cardiac stress test. She had covid in January 2024.   My goal is to make sure that this patient does not have sleep apnea but I am thinking that there may be other causes for her nonrestorative sleep quality.  So while I am ordering first a sleep test this could be a home sleep test or an in-lab sleep test I do want to make sure that this test will look at hypoxia low oxygen levels at night.  Not every home sleep test is able to correlate hypoxia data to sleep stages or sleep position which would be  important in this case.  In addition I would encourage a whole rheumatological workup.        1) snoring, fatigue, non restorative sleep   HST     I plan to follow up prn from HST either personally or through our NP within 4-5  months.   I would like to thank Nche, Roselie Rockford, NP and Nche, Roselie Rockford, Np [address] [place],  [place] [address] for allowing me to meet with and to take care of this pleasant patient.   Discussion of sleep hygiene setting bedtime and rise time,  hot shower  before bed time, no screen light in the bedroom, the bedroom should be cool, quiet and dark. Night lights should illuminate the floor not shine into your eyes. Golden glow  light is less intrusive than blue or cold light.  Read in a book with pages, not on a device. Consider audio books and soothing  sound -scapes.    After spending a total time of  45  minutes face to face and additional time for physical and neurologic examination, review of laboratory studies,  personal review of imaging studies, reports and results of other testing and review of referral information / records as far as provided in visit,   Electronically signed by: Dedra Gores, MD 04/24/2024 3:10 PM  [facility] [employer] Board certified by The [employer] of Sleep Medicine and Diplomate of the [employer] of Sleep Medicine. Board certified In Neurology through the ABPN, Fellow of the [employer] of Neurology.

## 2024-04-26 ENCOUNTER — Ambulatory Visit
Admission: RE | Admit: 2024-04-26 | Discharge: 2024-04-26 | Disposition: A | Discharge status: Home / Self Care | Source: Ambulatory Visit | Attending: Nurse Practitioner | Admitting: Nurse Practitioner

## 2024-04-26 DIAGNOSIS — G8929 Other chronic pain: Secondary | ICD-10-CM

## 2024-04-26 MED ORDER — GADOPICLENOL 0.5 MMOL/ML IV SOLN
10.0000 mL | Freq: Once | INTRAVENOUS | Status: AC | PRN
  Administered 2024-04-26: 10 mL via INTRAVENOUS

## 2024-04-28 ENCOUNTER — Ambulatory Visit: Payer: Self-pay | Admitting: Neurology

## 2024-04-28 LAB — PROTEIN ELECTROPHORESIS, SERUM
A/G Ratio: 1.6 (ref 0.7–1.7)
Albumin ELP: 3.9 g/dL (ref 2.9–4.4)
Alpha 1: 0.2 g/dL (ref 0.0–0.4)
Alpha 2: 0.7 g/dL (ref 0.4–1.0)
Beta: 0.9 g/dL (ref 0.7–1.3)
Gamma Globulin: 0.6 g/dL (ref 0.4–1.8)
Globulin, Total: 2.4 g/dL (ref 2.2–3.9)

## 2024-04-28 LAB — COMPREHENSIVE METABOLIC PANEL WITH GFR
ALT: 19 IU/L (ref 0–32)
AST: 18 IU/L (ref 0–40)
Albumin: 4.5 g/dL (ref 3.8–4.9)
Alkaline Phosphatase: 93 IU/L (ref 44–121)
BUN/Creatinine Ratio: 15 (ref 9–23)
BUN: 13 mg/dL (ref 6–24)
Bilirubin Total: 0.4 mg/dL (ref 0.0–1.2)
CO2: 22 mmol/L (ref 20–29)
Calcium: 9.5 mg/dL (ref 8.7–10.2)
Chloride: 102 mmol/L (ref 96–106)
Creatinine, Ser: 0.89 mg/dL (ref 0.57–1.00)
Globulin, Total: 1.8 g/dL (ref 1.5–4.5)
Glucose: 84 mg/dL (ref 70–99)
Potassium: 4.3 mmol/L (ref 3.5–5.2)
Sodium: 142 mmol/L (ref 134–144)
Total Protein: 6.3 g/dL (ref 6.0–8.5)
eGFR: 75 mL/min/1.73 (ref >59)

## 2024-04-28 LAB — IRON,TIBC AND FERRITIN PANEL
Ferritin: 73 ng/mL (ref 15–150)
Iron Saturation: 11 % — ABNORMAL LOW (ref 15–55)
Iron: 35 ug/dL (ref 27–159)
Total Iron Binding Capacity: 323 ug/dL (ref 250–450)
UIBC: 288 ug/dL (ref 131–425)

## 2024-04-28 LAB — ANA W/REFLEX IF POSITIVE: Anti Nuclear Antibody (ANA): NEGATIVE

## 2024-04-29 ENCOUNTER — Other Ambulatory Visit: Payer: Self-pay | Admitting: Nurse Practitioner

## 2024-04-29 DIAGNOSIS — M5136 Other intervertebral disc degeneration, lumbar region with discogenic back pain only: Secondary | ICD-10-CM

## 2024-04-29 DIAGNOSIS — D492 Neoplasm of unspecified behavior of bone, soft tissue, and skin: Secondary | ICD-10-CM

## 2024-04-29 DIAGNOSIS — G8929 Other chronic pain: Secondary | ICD-10-CM

## 2024-04-29 DIAGNOSIS — M48061 Spinal stenosis, lumbar region without neurogenic claudication: Secondary | ICD-10-CM

## 2024-04-29 DIAGNOSIS — R790 Abnormal level of blood mineral: Secondary | ICD-10-CM

## 2024-04-29 NOTE — Telephone Encounter (Signed)
 Patient called back to speak with terrah. Advised her the note that was left with the office address but she is wanting her to give a call back

## 2024-05-01 ENCOUNTER — Ambulatory Visit (HOSPITAL_COMMUNITY)
Admission: RE | Admit: 2024-05-01 | Discharge: 2024-05-01 | Disposition: A | Discharge status: Home / Self Care | Source: Ambulatory Visit | Attending: Cardiology | Admitting: Cardiology

## 2024-05-01 DIAGNOSIS — R5383 Other fatigue: Secondary | ICD-10-CM | POA: Insufficient documentation

## 2024-05-01 DIAGNOSIS — R0609 Other forms of dyspnea: Secondary | ICD-10-CM | POA: Insufficient documentation

## 2024-05-01 LAB — EXERCISE TOLERANCE TEST
Angina Index: 0
Duke Treadmill Score: 6
Estimated workload: 7
Exercise duration (min): 6 min
Exercise duration (sec): 0 s
MPHR: 162 {beats}/min
Peak HR: 160 {beats}/min
Percent HR: 98 %
Rest HR: 102 {beats}/min
ST Depression (mm): 0 mm

## 2024-05-30 ENCOUNTER — Other Ambulatory Visit (INDEPENDENT_AMBULATORY_CARE_PROVIDER_SITE_OTHER)

## 2024-05-30 DIAGNOSIS — R790 Abnormal level of blood mineral: Secondary | ICD-10-CM

## 2024-05-30 LAB — IRON,TIBC AND FERRITIN PANEL
%SAT: 21 % (ref 16–45)
Ferritin: 37 ng/mL (ref 16–232)
Iron: 65 ug/dL (ref 45–160)
TIBC: 305 ug/dL (ref 250–450)

## 2024-06-02 ENCOUNTER — Ambulatory Visit: Payer: Self-pay | Admitting: Nurse Practitioner

## 2024-06-03 ENCOUNTER — Ambulatory Visit (INDEPENDENT_AMBULATORY_CARE_PROVIDER_SITE_OTHER): Admitting: Neurology

## 2024-06-03 DIAGNOSIS — G4733 Obstructive sleep apnea (adult) (pediatric): Secondary | ICD-10-CM

## 2024-06-03 DIAGNOSIS — R0683 Snoring: Secondary | ICD-10-CM

## 2024-06-03 DIAGNOSIS — Z6831 Body mass index (BMI) 31.0-31.9, adult: Secondary | ICD-10-CM

## 2024-06-03 DIAGNOSIS — R5383 Other fatigue: Secondary | ICD-10-CM

## 2024-06-03 DIAGNOSIS — Z8739 Personal history of other diseases of the musculoskeletal system and connective tissue: Secondary | ICD-10-CM

## 2024-06-03 DIAGNOSIS — G478 Other sleep disorders: Secondary | ICD-10-CM

## 2024-06-03 NOTE — Progress Notes (Unsigned)
 Referring Physician:  Katheen Roselie Rockford, NP [address] [place],  [place] [address]  Primary Physician:  Katheen Roselie Rockford, NP  History of Present Illness: 06/05/2024 Amber Green has a history of GERD, scoliosis, hypercholesterolemia.  History of TL fusion for scoliosis in 1980.   Since 2002, she has intermittent flare ups of back pain. These usually respond to time and medication.   Current flare up started early summer and has not improved. She has intermittent LBP that is worse with transitioning to laying down/sitting up. She has pain at night when she turns over. Pain is also worse with bending and twisting. She has pain with sneezing. No leg pain, but she has intermittent feelings of fatigue in the legs. No numbness or tingling. No weakness in her legs.   Some relief with motrin . She does not take regularly.   Tobacco use: Does not smoke.   Bowel/Bladder Dysfunction: none  Conservative measures:  Physical therapy: did PT in 2019 with some improvement Multimodal medical therapy including regular antiinflammatories: Tylenol , Ibuprofen  Injections:  no epidural steroid injections  Past Surgery:  TL fusion-1980- herrington rod  Delon Arlean Sprang has no symptoms of cervical myelopathy.  The symptoms are causing a significant impact on the patient's life.   Review of Systems:  A 10 point review of systems is negative, except for the pertinent positives and negatives detailed in the HPI.  Past Medical History: Past Medical History:  Diagnosis Date   Arthritis    Clostridium difficile colitis 2003   COVID 10/09/2022   Diverticulosis    GERD (gastroesophageal reflux disease)    Grief 01/01/2017   Hiatal hernia    Night terrors    Scoliosis     Past Surgical History: Past Surgical History:  Procedure Laterality Date   APPENDECTOMY  1987   Herrington rods  07/1979   RSO  1987   with appendectomy   SPINE SURGERY       Allergies: Allergies as of 06/05/2024 - Review Complete 06/05/2024  Allergen Reaction Noted   Melatonin Other (See Comments) 08/15/2023    Medications: Outpatient Encounter Medications as of 06/05/2024  Medication Sig   CALCIUM-VITAMIN D  PO Take by mouth. (Patient taking differently: Take 1 tablet by mouth as needed (Takes for a week then off for 2 weeks).)   Multiple Vitamin (MULTIVITAMIN) tablet Take 1 tablet by mouth daily.   omeprazole  (PRILOSEC) 20 MG capsule TAKE 1 CAPSULE BY MOUTH EVERY DAY   No facility-administered encounter medications on file as of 06/05/2024.    Social History: Social History   Tobacco Use   Smoking status: Never   Smokeless tobacco: Never  Vaping Use   Vaping status: Never Used  Substance Use Topics   Alcohol use: Yes    Comment: On weekends occasionally   Drug use: No    Family Medical History: Family History  Problem Relation Age of Onset   Breast cancer Mother 71       mastectomy   Hypothyroidism Mother    Hypertension Father    Skin cancer Brother        not melanoma   Lung cancer Maternal Grandmother    Diabetes Maternal Grandfather    Hypertension Maternal Grandfather    Stroke Maternal Grandfather    Colon cancer Neg Hx    Esophageal cancer Neg Hx    Liver cancer Neg Hx    Pancreatic cancer Neg Hx    Rectal cancer Neg Hx    Stomach  cancer Neg Hx     Physical Examination: Vitals:   06/05/24 0906  BP: 110/70    General: Patient is well developed, well nourished, calm, collected, and in no apparent distress. Attention to examination is appropriate.  Respiratory: Patient is breathing without any difficulty.   NEUROLOGICAL:     Awake, alert, oriented to person, place, and time.  Speech is clear and fluent. Fund of knowledge is appropriate.   Cranial Nerves: Pupils equal round and reactive to light.  Facial tone is symmetric.    Well healed TL incision.   No lower lumbar tenderness.   No abnormal lesions on  exposed skin.   Strength: Side Biceps Triceps Deltoid Interossei Grip Wrist Ext. Wrist Flex.  R 5 5 5 5 5 5 5   L 5 5 5 5 5 5 5    Side Iliopsoas Quads Hamstring PF DF EHL  R 5 5 5 5 5 5   L 5 5 5 5 5 5    Reflexes are 2+ and symmetric at the biceps, brachioradialis, patella and achilles.   Hoffman's is absent.  Clonus is not present.   Bilateral upper and lower extremity sensation is intact to light touch.     No pain with IR/ER of both hips.   Gait is normal.     Medical Decision Making  Imaging: Lumbar MRI dated 04/26/24:  FINDINGS: Segmentation: Standard.   Alignment:  Levoscoliosis of the lumbar spine with apex at L3.   Vertebrae: Operative changes of posterior fusion from the thoracic spine through the level of L2. Degenerative endplate marrow changes at L2-L3. No compression fractures.   Conus medullaris and cauda equina: The conus medullaris terminates at the level of L1-L2. The distal spinal cord signal intensity is normal.   Paraspinal and other soft tissues: Enhancing right paraspinal mass measuring up to 5 cm in craniocaudal dimension and 2.8 cm in greatest axial dimension. This appears stable since prior examination in 2022. The visualized aorta is normal.   Disc levels:   L1-L2: Disc is normal in configuration. Facets are obscured. No neuroforaminal stenosis. No spinal canal stenosis.   L2-L3: Disc bulge. Moderate bilateral facet arthropathy. Mild left neuroforaminal stenosis. No spinal canal stenosis.   L3-L4: Disc bulge. Moderate bilateral facet arthropathy. Moderate bilateral neuroforaminal stenosis. Mild spinal canal stenosis.   L4-L5: Disc bulge. Moderate bilateral facet arthropathy. Moderate left and mild right neuroforaminal stenosis. Mild spinal canal stenosis.   L5-S1: Disc bulge. Moderate bilateral facet arthropathy. Mild left neuroforaminal stenosis. No spinal canal stenosis.   IMPRESSION: 1. Enhancing right paraspinal mass measuring up  to 5 cm in craniocaudal dimension. This appears stable since prior examination in 2022. 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 2. Moderate neuroforaminal stenosis bilaterally at L3-L4 and on the left at L4-L5. 3. Operative changes of posterior spinal fusion from the thoracic spine through the level of L2.     Electronically Signed   By: Clem Savory M.D.   On: 04/29/2024 14:26    I have personally reviewed the images and agree with the above interpretation.  Assessment and Plan: Ms. Bertoni has a history of TL fusion for scoliosis in 1980.   Since 2002, she has intermittent flare ups of back pain. These usually respond to time and medication.   Current flare up started early summer and has not improved. She has intermittent LBP that is worse with transitioning to laying down/sitting up. No leg pain, but she has intermittent feelings of fatigue in the  legs. No numbness or tingling. No weakness in her legs.  She has harrington rod and fusion from T4-L2 (this appeared fused on CT from 01/14/19). She has mild central stenosis L3-L5 with multilevel foraminal stenosis and spondylosis.   Note made of right paraspinal mass- this was seen on CT from 01/14/19 and 05/17/21, appears stable. 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   Treatment options discussed with patient and following plan made:   - Full length scoliosis xrays ordered at [facility].  - Will review with Dr. Clois once I get them back and message her with further recommendations.  - Discussed trial of muscle relaxer and she declines.  - Depending on imaging results, may discuss PT (she could do in [place]) and/or injections.   I spent a total of 35 minutes in face-to-face and non-face-to-face activities related to this patient's care today including review of outside records, review of imaging, review of symptoms, physical exam, discussion of differential diagnosis, discussion of treatment  options, and documentation.   Thank you for involving me in the care of this patient.   Glade Boys PA-C Dept. of Neurosurgery

## 2024-06-05 ENCOUNTER — Ambulatory Visit
Admission: RE | Admit: 2024-06-05 | Discharge: 2024-06-05 | Disposition: A | Discharge status: Home / Self Care | Source: Ambulatory Visit | Attending: Orthopedic Surgery | Admitting: Orthopedic Surgery

## 2024-06-05 ENCOUNTER — Ambulatory Visit: Admitting: Orthopedic Surgery

## 2024-06-05 ENCOUNTER — Encounter: Payer: Self-pay | Admitting: Orthopedic Surgery

## 2024-06-05 VITALS — BP 110/70 | Ht 70.0 in | Wt 220.2 lb

## 2024-06-05 DIAGNOSIS — M4325 Fusion of spine, thoracolumbar region: Secondary | ICD-10-CM

## 2024-06-05 DIAGNOSIS — M4120 Other idiopathic scoliosis, site unspecified: Secondary | ICD-10-CM | POA: Diagnosis not present

## 2024-06-05 DIAGNOSIS — M47816 Spondylosis without myelopathy or radiculopathy, lumbar region: Secondary | ICD-10-CM | POA: Diagnosis not present

## 2024-06-12 ENCOUNTER — Encounter: Payer: Self-pay | Admitting: Orthopedic Surgery

## 2024-06-12 DIAGNOSIS — M47816 Spondylosis without myelopathy or radiculopathy, lumbar region: Secondary | ICD-10-CM

## 2024-06-12 DIAGNOSIS — M4120 Other idiopathic scoliosis, site unspecified: Secondary | ICD-10-CM

## 2024-06-12 DIAGNOSIS — M4325 Fusion of spine, thoracolumbar region: Secondary | ICD-10-CM

## 2024-06-12 NOTE — Telephone Encounter (Signed)
 Scoliosis xrays dated 06/05/24:  FINDINGS:   BONES: Thoracolumbar fusion. S-shaped thoracolumbar scoliosis. Cobb angle 30 degrees involving the mid to lower thoracic spine and 34 degrees involving the lumbar spine. No acute fracture or focal osseous lesion.   DISCS AND DEGENERATIVE CHANGES: No significant degenerative changes.   SOFT TISSUES: The visualized lungs and abdomen demonstrate no acute abnormality.   IMPRESSION: 1. S-shaped thoracolumbar scoliosis, as above. 2. Status post thoracolumbar fusion.   Electronically signed by: Pinkie Pebbles MD 06/11/2024 01:36 AM EDT RP Workstation: [id number]  I have personally reviewed the images and agree with the above interpretation.  Reviewed with Dr. Clois. He agrees with PT and/or injections.   Dr. Clois and Dr. 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 Dr. Claudene does not recommend any treatment for this unless she is symptomatic.   Message sent to patient.

## 2024-06-17 NOTE — Addendum Note (Signed)
 Addended by: HILMA HASTINGS on: 06/17/2024 12:45 PM   Modules accepted: Orders

## 2024-06-19 NOTE — Progress Notes (Signed)
 [facility] Amber Green 58 year old female 13-Nov-1965   HOME SLEEP TEST REPORT ( by Watch PAT)   STUDY DATE:  06-03-2024, data available as of 06-19-2024  ORDERING CLINICIAN: Dedra Gores, MD  REFERRING CLINICIAN: Dr Katheen   CLINICAL INFORMATION/HISTORY: 04-24-2024, patient reports snoring and a fatigue.   I wake up  not restored neither refreshed even if I slept 9 hours, its hard to wake up in AM.  . My muscles don't get sore they are just weak.  Regular exercise (zoomba),  non smoker, very moderate alcohol use, moderate caffeine use.  Sleep relevant medical history: Nocturia 1-2, No ENT surgery in the past - no cervical spine surgery, no TBI.  Harrington rod for scoliosis, , surgery at age 66.  Following her husband's death , melatonin induced night terrors. My goal is to make sure that this patient does not have sleep apnea but I am thinking that there may be other causes for her nonrestorative sleep quality. So while I am ordering first a sleep test ( this could be a home sleep test or an in-lab sleep tes)t I do want to make sure that this test will look at hypoxia (low oxygen levels) at night. Not every home sleep test is able to correlate hypoxia data to sleep stages or sleep position which would be important in this case.  In addition I would encourage a whole rheumatological workup.      Epworth sleepiness score: 13/ 24 points   FSS endorsed at 39/ 63 points.      BMI:  31.4 kg/m   Neck Circumference: 14.5   FINDINGS:   Sleep Summary:   Total Recording Time (hours, min): 8 h 26 minutes       Total Sleep Time ( TST in hours, min):   6 hours and 49 minutes              Percent REM (%):   25%                                     Respiratory Indices:   Calculated AASM based  pAHI (per hour):   28.6/h ,no central events                            REM pAHI:    44/h                                             NREM pAHI:    23.4/h                           Snoring :   mean volume of 48 dB ,loud.     Positional AHI :  non supine AHI was 8.3/h and supine sleep AHI was 56.7/h (!)                                                Oxygen Saturation Statistics:   Oxygen Saturation (%) Mean: 93%  O2 Saturation Range (%):    84% through 98%                                    O2 Saturation (minutes) <89%:   6.7 minutes, < 2% of total sleep time .         Pulse Rate Statistics:   Pulse Mean (bpm):    74 bpm             Pulse Range:   from 52 through 112 bpm.  Caveat: the watch pat device does not provide data of cardiac rhythm.              IMPRESSION:  This HST confirms the presence of  moderate -severe apnea, all obstructive sleep apnea with an apnea hypopnea Index of  28.6/h, REM sleep increased the AHI  but this apneas was truly dependent on supine sleep position.       RECOMMENDATION:  Avoiding supine sleep is treatment number one. Weight loss is recommended  to reduce the REM sleep accentuation of sleep apnea.  Treatment by CPAP  autotitration with a ResMed device from 7-17 cm water pressure, 2 cm EPR and heated humidification is recommended. The mask should be selected to allow non-supine sleep.      Any CPAP patient should be reminded to be fully compliant with PAP therapy , (defined as using PAP therapy for more than 4 hours each night ) with the goal to improve sleep related symptoms and decrease long term cardiovascular risks. Any PAP therapy patient should be reminded, that it may take up to 3 months to get fully used to using PAP and it may take 1-2 weeks for an established CPAP user to acclimatize to changes in pressure or mask. The earlier full compliance is achieved, the better long term compliance tends to be.   Please note that untreated obstructive sleep apnea may carry additional perioperative morbidity. Patients with significant obstructive sleep apnea should receive perioperative PAP therapy and the  surgical team should be informed of the diagnosis and degree of sleep disordered breathing.  Sleep fragmentation in the presence of normal proportional sleep stages is a nonspecific findings and per se does not signify an intrinsic sleep disorder or a cause for the patient's sleep-related symptoms.  Causes include (but are not limited to) the unfamiliarity of sleeping while recorded by HST device or sleeping in a sleep lab for a full Polysomnography sleep study, but also circadian rhythm disturbances, medication side effects or an underlying mood disorder or medical problem.     INTERPRETING PHYSICIAN:   Dedra Gores, MD [facility] Sleep Board certified by The [employer] of Sleep Medicine and Diplomate of the [employer] of [employer]. Board certified In Neurology through the ABPN, Fellow of the [employer] of Neu[employer].

## 2024-06-23 ENCOUNTER — Other Ambulatory Visit (HOSPITAL_BASED_OUTPATIENT_CLINIC_OR_DEPARTMENT_OTHER)

## 2024-06-23 ENCOUNTER — Encounter: Payer: Self-pay | Admitting: Neurology

## 2024-06-23 DIAGNOSIS — G4733 Obstructive sleep apnea (adult) (pediatric): Secondary | ICD-10-CM | POA: Insufficient documentation

## 2024-06-23 NOTE — Procedures (Signed)
 [facility] Amber Green 58 year old female 15-Jan-1966   HOME SLEEP TEST REPORT ( by Watch PAT)   STUDY DATE:  06-03-2024, data available as of 06-19-2024  ORDERING CLINICIAN: Dedra Gores, MD  REFERRING CLINICIAN: Dr Katheen   CLINICAL INFORMATION/HISTORY: 04-24-2024, patient reports snoring and a fatigue.   I wake up  not restored neither refreshed even if I slept 9 hours, its hard to wake up in AM.  . My muscles don't get sore they are just weak.  Regular exercise (zoomba),  non smoker, very moderate alcohol use, moderate caffeine use.  Sleep relevant medical history: Nocturia 1-2, No ENT surgery in the past - no cervical spine surgery, no TBI.  Harrington rod for scoliosis, , surgery at age 26.  Following her husband's death , melatonin induced night terrors. My goal is to make sure that this patient does not have sleep apnea but I am thinking that there may be other causes for her nonrestorative sleep quality. So while I am ordering first a sleep test ( this could be a home sleep test or an in-lab sleep tes)t I do want to make sure that this test will look at hypoxia (low oxygen levels) at night. Not every home sleep test is able to correlate hypoxia data to sleep stages or sleep position which would be important in this case.  In addition I would encourage a whole rheumatological workup.      Epworth sleepiness score: 13/ 24 points   FSS endorsed at 39/ 63 points.      BMI:  31.4 kg/m   Neck Circumference: 14.5   FINDINGS:   Sleep Summary:   Total Recording Time (hours, min): 8 h 26 minutes       Total Sleep Time ( TST in hours, min):   6 hours and 49 minutes              Percent REM (%):   25%                                     Respiratory Indices:   Calculated AASM based  pAHI (per hour):   28.6/h ,no central events                            REM pAHI:    44/h                                             NREM pAHI:    23.4/h                           Snoring :   mean volume of 48 dB ,loud.     Positional AHI :  non supine AHI was 8.3/h and supine sleep AHI was 56.7/h (!)                                                Oxygen Saturation Statistics:   Oxygen Saturation (%) Mean: 93%  O2 Saturation Range (%):    84% through 98%                                    O2 Saturation (minutes) <89%:   6.7 minutes, < 2% of total sleep time .         Pulse Rate Statistics:   Pulse Mean (bpm):    74 bpm             Pulse Range:   from 52 through 112 bpm.  Caveat: the watch pat device does not provide data of cardiac rhythm.              IMPRESSION:  This HST confirms the presence of  moderate -severe apnea, all obstructive sleep apnea with an apnea hypopnea Index of  28.6/h, REM sleep increased the AHI  but this apneas was truly dependent on supine sleep position.       RECOMMENDATION:  Avoiding supine sleep is treatment number one. Weight loss is recommended  to reduce the REM sleep accentuation of sleep apnea.  Treatment by CPAP  autotitration with a ResMed device from 7-17 cm water pressure, 2 cm EPR and heated humidification is recommended. The mask should be selected to allow non-supine sleep.      Any CPAP patient should be reminded to be fully compliant with PAP therapy , (defined as using PAP therapy for more than 4 hours each night ) with the goal to improve sleep related symptoms and decrease long term cardiovascular risks. Any PAP therapy patient should be reminded, that it may take up to 3 months to get fully used to using PAP and it may take 1-2 weeks for an established CPAP user to acclimatize to changes in pressure or mask. The earlier full compliance is achieved, the better long term compliance tends to be.   Please note that untreated obstructive sleep apnea may carry additional perioperative morbidity. Patients with significant obstructive sleep apnea should receive perioperative PAP therapy and the  surgical team should be informed of the diagnosis and degree of sleep disordered breathing.  Sleep fragmentation in the presence of normal proportional sleep stages is a nonspecific findings and per se does not signify an intrinsic sleep disorder or a cause for the patient's sleep-related symptoms.  Causes include (but are not limited to) the unfamiliarity of sleeping while recorded by HST device or sleeping in a sleep lab for a full Polysomnography sleep study, but also circadian rhythm disturbances, medication side effects or an underlying mood disorder or medical problem.     INTERPRETING PHYSICIAN:   Dedra Gores, MD [facility] Sleep Board certified by The [employer] of Sleep Medicine and Diplomate of the [employer]. Board certified In Neurology through the ABPN, Fellow of the [employer].

## 2024-06-24 NOTE — Telephone Encounter (Signed)
 From Dr Dohmeier:   This HST confirms the presence of moderate -severe apnea, all obstructive sleep apnea with an apnea hypopnea Index of 28.6/h, REM sleep increased the AHI but this apnea was truly dependent on supine sleep position ( sleeping on one's back) .    Treatment by CPAP autotitration with a ResMed device from 7-17 cm water pressure, 2 cm EPR and heated humidification is recommended. The mask should be selected to allow sleep positions other than on the back.

## 2024-06-24 NOTE — Progress Notes (Signed)
 Sent to patient via mychart.

## 2024-06-25 ENCOUNTER — Other Ambulatory Visit: Payer: Self-pay

## 2024-06-25 ENCOUNTER — Encounter: Payer: Self-pay | Admitting: Physical Therapy

## 2024-06-25 ENCOUNTER — Ambulatory Visit: Attending: Orthopedic Surgery | Admitting: Physical Therapy

## 2024-06-25 DIAGNOSIS — M47816 Spondylosis without myelopathy or radiculopathy, lumbar region: Secondary | ICD-10-CM | POA: Insufficient documentation

## 2024-06-25 DIAGNOSIS — M5459 Other low back pain: Secondary | ICD-10-CM | POA: Insufficient documentation

## 2024-06-25 DIAGNOSIS — M4120 Other idiopathic scoliosis, site unspecified: Secondary | ICD-10-CM | POA: Diagnosis not present

## 2024-06-25 DIAGNOSIS — M4325 Fusion of spine, thoracolumbar region: Secondary | ICD-10-CM | POA: Diagnosis not present

## 2024-06-25 DIAGNOSIS — M6281 Muscle weakness (generalized): Secondary | ICD-10-CM | POA: Diagnosis present

## 2024-06-25 NOTE — Therapy (Signed)
 OUTPATIENT PHYSICAL THERAPY THORACOLUMBAR EVALUATION   Patient Name: Amber Green MRN: [id number] DOB:08-23-1966, 58 y.o., female Today's Date: 06/25/2024  END OF SESSION:  PT End of Session - 06/25/24 1821     Visit Number 1    Number of Visits 7    Date for Recertification  08/06/24    PT Start Time 1700    PT Stop Time 1740    PT Time Calculation (min) 40 min    Activity Tolerance Patient tolerated treatment well    Behavior During Therapy [facility] for tasks assessed/performed          Past Medical History:  Diagnosis Date   Arthritis    Clostridium difficile colitis 2003   COVID 10/09/2022   Diverticulosis    GERD (gastroesophageal reflux disease)    Grief 01/01/2017   Hiatal hernia    Night terrors    Scoliosis    Past Surgical History:  Procedure Laterality Date   APPENDECTOMY  1987   Herrington rods  07/1979   RSO  1987   with appendectomy   SPINE SURGERY     Patient Active Problem List   Diagnosis Date Noted   OSA (obstructive sleep apnea) 06/23/2024   Adult BMI 31.0-31.9 kg/sq m 04/24/2024   Non-restorative sleep 04/24/2024   Snoring 04/24/2024   Fatigue 03/19/2024   Adnexal cyst 08/15/2023   Situational mixed anxiety and depressive disorder 08/15/2023   Pure hypercholesterolemia 08/15/2023   History of bowel infarction 07/31/2022   Family history of melanoma 05/29/2022   Melanocytic nevi of trunk 05/29/2022   Sensorineural hearing loss (SNHL) of right ear with restricted hearing of left ear 05/29/2022   Night terrors 05/29/2022   GERD (gastroesophageal reflux disease) 05/17/2021   Lipoma of left lower extremity 11/16/2017   H/O scoliosis 01/25/2017   Back pain 01/01/2017    PCP: Katheen Roselie [place], Np  REFERRING PROVIDER: Hilma Hastings, PA-C  REFERRING DIAG:  Diagnosis  M43.25 (ICD-10-CM) - Fusion of spine of thoracolumbar region  M41.20 (ICD-10-CM) - Other idiopathic scoliosis, unspecified spinal region  M47.816 (ICD-10-CM) - Lumbar  spondylosis    Rationale for Evaluation and Treatment: Rehabilitation  THERAPY DIAG:  Other low back pain  Muscle weakness (generalized)  PERTINENT HISTORY: OSA, hearing loss B, GERD, Lumbar fusion (harrington rod in 1980)  WEIGHT BEARING RESTRICTIONS: No  FALLS:  Has patient fallen in last 6 months? Yes. Number of falls 1, walking backwards and tripped on a curb.   LIVING ENVIRONMENT: Lives with: lives with their family Lives in: House/apartment Stairs: Yes: External: 3 steps; none Has following equipment at home: None  OCCUPATION: Photographer   PRECAUTIONS: None ---------------------------------------------------------------------------------------------  SUBJECTIVE:  SUBJECTIVE STATEMENT: Eval statement 06/25/2024: LBP that increases when laying flat, rolling, or transitioning from Sit to/from stand.  Other vulnerable positions such as bending and reaching cause pain and causes her to throw her back out. Pain has been ongoing since one throw out since the summer. Did not have a definite MOI this time around. Currently 6/10 pain, no n/t, no swelling  RED FLAGS: None    PLOF: Independent  PATIENT GOALS: minimize pain  NEXT MD VISIT: need to schedule. ---------------------------------------------------------------------------------------------  OBJECTIVE:  Note: Objective measures were completed at Evaluation unless otherwise noted.  DIAGNOSTIC FINDINGS:  IMPRESSION: 1. Enhancing right paraspinal mass measuring up to 5 cm in craniocaudal dimension. This appears stable since prior examination in 2022. 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 2. Moderate neuroforaminal stenosis bilaterally at L3-L4 and on the left at L4-L5. 3. Operative changes of posterior spinal  fusion from the thoracic spine through the level of L2.     Electronically Signed   By: Clem Savory M.D.   On: 04/29/2024 14:26    PATIENT SURVEYS:  MODI  COGNITION: Overall cognitive status: Within functional limits for tasks assessed   PALPATION: TTP to lumabr spine around L2 causes most pain  Lumbar contraction pattern  L Multifidus: poor contraction,  R Multifidus:poor contraction   SENSATION: WFL  MUSCLE LENGTH: Hamstrings: Right 10d; deg; Left 10d deg    POSTURE: right pelvic obliquity and flexed trunk    LUMBAR ROM: unable to tolerate full assessment at eval  AROM eval  Flexion   Extension   Right lateral flexion   Left lateral flexion   Right rotation   Left rotation    (Blank rows = not tested)  ! Indicates pain with testing  LOWER EXTREMITY ROM:   unable to tolerate testing at eval  Active  Right eval Left eval  Hip flexion    Hip extension    Hip abduction    Hip adduction    Hip internal rotation    Hip external rotation    Knee flexion    Knee extension    Ankle dorsiflexion    Ankle plantarflexion    Ankle inversion    Ankle eversion     (Blank rows = not tested)  ! Indicates pain with testing  LOWER EXTREMITY MMT:    MMT Right eval Left eval  Hip flexion 4 4-  Hip extension 4 4  Hip abduction 4 4  Hip adduction    Hip internal rotation    Hip external rotation    Knee flexion 4+ 4  Knee extension 4- 5  Ankle dorsiflexion    Ankle plantarflexion    Ankle inversion    Ankle eversion     (Blank rows = not tested)   ! Indicates pain with testing LUMBAR SPECIAL TESTS:  Prone instability test: Positive  FUNCTIONAL TESTS:  5 times sit to stand: 23s  GAIT: Distance walked: 100' Assistive device utilized: None Level of assistance: Complete Independence Comments: decreased step length, flexed trunk  [facility] Adult PT Treatment:                                                DATE: 06/25/2024 Self Care: POC discussion Pt  education  PATIENT EDUCATION:  Education details: Pt received education regarding HEP performance, ADL performance, functional activity tolerance, impairment education, appropriate performance of therapeutic activities.  Person educated: Patient Education method: Explanation, Demonstration, Tactile cues, Verbal cues, and Handouts Education comprehension: verbalized understanding and returned demonstration  HOME EXERCISE PROGRAM: Access Code: [email or web address] URL: [email or web address] Date: 06/25/2024 Prepared by: Mabel Kiang  Exercises - Diaphragmatic Breathing with Pelvic Tilt in Hooklying  - 1-3 x daily - 7 x weekly - 2 sets - 25 reps - Supine Bridge  - 1 x daily - 4 x weekly - 2-3 sets - 12 reps - 4s hold - Sit to Stand  - 1 x daily - 4 x weekly - 2-3 sets - 10 reps - Supine March  - 1 x daily - 4 x weekly - 3 sets - 10 reps ---------------------------------------------------------------------------------------------  ASSESSMENT:  CLINICAL IMPRESSION: Eval impression (06/25/2024): Pt. attended today's physical therapy session for evaluation of LBP. Pt has complaints of back pain onset since 1980 that has recently gotten worse below their harrington rod. Pt has notable deficits and would benefit from therapeutic focus on lumbar stability, positional intolerance to prone>supine, proximal hip strength,posture, transfer quality, and general activity tolerance.  Today's treatment focus primarily on pt education detailed in objective. Pt demonstrated good understanding of education provided. required minimal v/t cues and moderate assistance for appropriate performance with today's bed mobility from supine to sit. Pt requires the intervention of skilled outpatient physical therapy to address the aforementioned deficits and progress towards a functional level in  line with therapeutic goals.    OBJECTIVE IMPAIRMENTS: Abnormal gait, decreased activity tolerance, decreased mobility, difficulty walking, decreased ROM, decreased strength, impaired perceived functional ability, improper body mechanics, postural dysfunction, and pain.   ACTIVITY LIMITATIONS: carrying, lifting, bending, sitting, standing, squatting, sleeping, transfers, bed mobility, and locomotion level  PARTICIPATION LIMITATIONS: cleaning, laundry, driving, shopping, community activity, and occupation  PERSONAL FACTORS: Age, Fitness, Past/current experiences, and Time since onset of injury/illness/exacerbation are also affecting patient's functional outcome.   REHAB POTENTIAL: Fair fusion since 1980, moderate degenerative changes along  multilevel lumbar spine  CLINICAL DECISION MAKING: Evolving/moderate complexity  EVALUATION COMPLEXITY: Moderate   GOALS: Goals reviewed with patient? YES  SHORT TERM GOALS: Target date: 07/16/2024    Pt will be independent with administered HEP to demonstrate the competency necessary for long term managemnet of symptoms at home. Baseline: Goal status: INITIAL  LONG TERM GOALS: Target date: 08/06/2024  Pt. Will achieve a MODI score of 9/50 as to demonstrate improvement in self-perceived functional ability with daily activities.  Baseline: 15/50 Goal status: INITIAL  2.  Pt will improve Global hip strength to a 4+/5 to demonstrate improvement in strength for quality of motion and activity performance.  Baseline:  Goal status: INITIAL  3. Pt will report pain levels improving during ADLs to be less than or equal to 3/10 as to demonstrate improved tolerance with daily functional activities such as sitting and bending forward. Baseline: 6/10 Goal status: INITIAL  4.  Pt will improve x5 STS time to 18s to demonstrate improved transfer quality, BLE functional strength, and coordination. Baseline: 23s Goal status:  INITIAL ---------------------------------------------------------------------------------------------  PLAN:  PT FREQUENCY: 1-2x/week  PT DURATION: 6 weeks  PLANNED INTERVENTIONS: 97110-Therapeutic exercises, 97530- Therapeutic activity, 97112- Neuromuscular re-education, 97535- Self Care, 02859- Manual therapy, [phone]- Gait training, [phone]- Aquatic Therapy, [phone] (1-2 muscles), [phone] (3+ muscles)- Dry Needling, Patient/Family education, Balance training, Stair training, Taping, Joint mobilization, Spinal mobilization, Cryotherapy, and Moist heat.  PLAN FOR  NEXT SESSION: Review HEP, Begin POC as detailed in assessment   Mabel Kiang, PT, DPT 06/25/2024, 6:22 PM   .

## 2024-07-07 ENCOUNTER — Ambulatory Visit

## 2024-07-08 ENCOUNTER — Telehealth: Payer: Self-pay | Admitting: Nurse Practitioner

## 2024-07-08 DIAGNOSIS — Z780 Asymptomatic menopausal state: Secondary | ICD-10-CM

## 2024-07-08 NOTE — Telephone Encounter (Signed)
 Please reorder Dexa scan at [facility]. [facility] is no longer scheduling Dexa/Bone Density scans. Please route message back to Admin team to contact patient for scheduling.

## 2024-07-15 NOTE — Progress Notes (Unsigned)
 Amber Green 11/21/1965 [id number]   History:  58 y.o. G2P2 presents for annual exam. Postmenopausal - no HRT, no bleeding. H/O RSO for benign cyst. Normal pap history.  Gynecologic History Patient's last menstrual period was 10/19/2016 (approximate).   Contraception/Family planning: post menopausal status Sexually active: No  Health Maintenance Last Pap: 06/27/2021. Results were: Normal, 3-year repeat Last mammogram: 04/02/2024. Results were: Normal Last colonoscopy: 04/06/2017. Results were: Normal, 10-year recall Last Dexa: Not indicated     08/15/2023    8:15 AM  Depression screen PHQ 2/9  Decreased Interest 1  Down, Depressed, Hopeless 1  PHQ - 2 Score 2  Altered sleeping 1  Tired, decreased energy 1  Change in appetite 1  Feeling bad or failure about yourself  0  Trouble concentrating 1  Moving slowly or fidgety/restless 0  Suicidal thoughts 0  PHQ-9 Score 6  Difficult doing work/chores Somewhat difficult     Past medical history, past surgical history, family history and social history were all reviewed and documented in the EPIC chart. Widowed. Works in [employer] for [employer]. 2 sons. Mother diagnosed with breast cancer at age 59.   ROS:  A ROS was performed and pertinent positives and negatives are included.  Exam:  There were no vitals filed for this visit.    There is no height or weight on file to calculate BMI.  General appearance:  Normal Thyroid :  Symmetrical, normal in size, without palpable masses or nodularity. Respiratory  Auscultation:  Clear without wheezing or rhonchi Cardiovascular  Auscultation:  Regular rate, without rubs, murmurs or gallops  Edema/varicosities:  Not grossly evident Abdominal  Soft,nontender, without masses, guarding or rebound.  Liver/spleen:  No organomegaly noted  Hernia:  None appreciated  Skin  Inspection:  Grossly normal Breasts: Examined lying and sitting.   Right: Without masses, retractions, nipple  discharge or axillary adenopathy.   Left: Without masses, retractions, nipple discharge or axillary adenopathy. Pelvic: External genitalia:  no lesions              Urethra:  normal appearing urethra with no masses, tenderness or lesions              Bartholins and Skenes: normal                 Vagina: normal appearing vagina with normal color and discharge, no lesions. Atrophic changes              Cervix: no lesions Bimanual Exam:  Uterus:  no masses or tenderness              Adnexa: no mass, fullness, tenderness              Rectovaginal: Deferred              Anus:  normal, no lesions  Patient informed chaperone available to be present for breast and pelvic exam. Patient has requested no chaperone to be present. Patient has been advised what will be completed during breast and pelvic exam.   Assessment/Plan:  58 y.o. G2P2 for annual exam.   Well female exam with routine gynecological exam - Education provided on SBEs, importance of preventative screenings, current guidelines, high calcium diet, regular exercise, and multivitamin daily. Lab tech not in office today. Will have labs done with PCP.   Postmenopausal - no HRT, no bleeding  Screening for cervical cancer - Normal Pap history. Pap today per guidelines.   Screening for breast cancer - Normal mammogram history.  Continue annual screenings.  Normal breast exam today.  Screening for colon cancer - 2018 colonoscopy. Will repeat at GI's recommended interval.   Screening for osteoporosis - Average risk. Will plan for Dexa at age 58.   No follow-ups on file.      Amber Amber Shutter DNP, 2:27 PM 07/15/2024

## 2024-07-16 ENCOUNTER — Encounter: Payer: Self-pay | Admitting: Nurse Practitioner

## 2024-07-16 ENCOUNTER — Ambulatory Visit: Admitting: Nurse Practitioner

## 2024-07-16 ENCOUNTER — Other Ambulatory Visit (HOSPITAL_COMMUNITY)
Admission: RE | Admit: 2024-07-16 | Discharge: 2024-07-16 | Disposition: A | Discharge status: Home / Self Care | Source: Ambulatory Visit | Attending: Nurse Practitioner | Admitting: Nurse Practitioner

## 2024-07-16 VITALS — BP 110/68 | HR 87 | Ht 69.0 in | Wt 220.0 lb

## 2024-07-16 DIAGNOSIS — Z780 Asymptomatic menopausal state: Secondary | ICD-10-CM

## 2024-07-16 DIAGNOSIS — Z01419 Encounter for gynecological examination (general) (routine) without abnormal findings: Secondary | ICD-10-CM | POA: Insufficient documentation

## 2024-07-16 DIAGNOSIS — Z124 Encounter for screening for malignant neoplasm of cervix: Secondary | ICD-10-CM | POA: Diagnosis present

## 2024-07-16 DIAGNOSIS — Z1331 Encounter for screening for depression: Secondary | ICD-10-CM

## 2024-07-16 NOTE — Therapy (Unsigned)
 OUTPATIENT PHYSICAL THERAPY THORACOLUMBAR EVALUATION   Patient Name: Amber Green MRN: [id number] DOB:1966-07-07, 58 y.o., female Today's Date: 07/17/2024  END OF SESSION:  PT End of Session - 07/17/24 1534     Visit Number 2    Number of Visits 7    Date for Recertification  08/06/24    PT Start Time 1530    PT Stop Time 1610    PT Time Calculation (min) 40 min    Activity Tolerance Patient tolerated treatment well    Behavior During Therapy [facility] for tasks assessed/performed           Past Medical History:  Diagnosis Date   Arthritis    Clostridium difficile colitis 2003   COVID 10/09/2022   Diverticulosis    GERD (gastroesophageal reflux disease)    Grief 01/01/2017   Hiatal hernia    Night terrors    Scoliosis    Past Surgical History:  Procedure Laterality Date   APPENDECTOMY  1987   Herrington rods  07/1979   RSO  1987   with appendectomy   SPINE SURGERY     Patient Active Problem List   Diagnosis Date Noted   OSA (obstructive sleep apnea) 06/23/2024   Adult BMI 31.0-31.9 kg/sq m 04/24/2024   Non-restorative sleep 04/24/2024   Snoring 04/24/2024   Fatigue 03/19/2024   Adnexal cyst 08/15/2023   Situational mixed anxiety and depressive disorder 08/15/2023   Pure hypercholesterolemia 08/15/2023   History of bowel infarction 07/31/2022   Family history of melanoma 05/29/2022   Melanocytic nevi of trunk 05/29/2022   Sensorineural hearing loss (SNHL) of right ear with restricted hearing of left ear 05/29/2022   Night terrors 05/29/2022   GERD (gastroesophageal reflux disease) 05/17/2021   Lipoma of left lower extremity 11/16/2017   H/O scoliosis 01/25/2017   Back pain 01/01/2017    PCP: Katheen Roselie [place], Np  REFERRING PROVIDER: Hilma Hastings, PA-C  REFERRING DIAG:  Diagnosis  M43.25 (ICD-10-CM) - Fusion of spine of thoracolumbar region  M41.20 (ICD-10-CM) - Other idiopathic scoliosis, unspecified spinal region  M47.816 (ICD-10-CM) -  Lumbar spondylosis    Rationale for Evaluation and Treatment: Rehabilitation  THERAPY DIAG:  Other low back pain  Muscle weakness (generalized)  PERTINENT HISTORY: OSA, hearing loss B, GERD, Lumbar fusion (harrington rod in 1980)  WEIGHT BEARING RESTRICTIONS: No  FALLS:  Has patient fallen in last 6 months? Yes. Number of falls 1, walking backwards and tripped on a curb.   LIVING ENVIRONMENT: Lives with: lives with their family Lives in: House/apartment Stairs: Yes: External: 3 steps; none Has following equipment at home: None  OCCUPATION: Photographer   PRECAUTIONS: None ---------------------------------------------------------------------------------------------  SUBJECTIVE:  SUBJECTIVE STATEMENT: Eval statement 06/25/2024: LBP that increases when laying flat, rolling, or transitioning from Sit to/from stand.  Other vulnerable positions such as bending and reaching cause pain and causes her to throw her back out. Pain has been ongoing since one throw out since the summer. Did not have a definite MOI this time around. Currently 6/10 pain, no n/t, no swelling  RED FLAGS: None    PLOF: Independent  PATIENT GOALS: minimize pain  NEXT MD VISIT: need to schedule. ---------------------------------------------------------------------------------------------  OBJECTIVE:  Note: Objective measures were completed at Evaluation unless otherwise noted.  DIAGNOSTIC FINDINGS:  IMPRESSION: 1. Enhancing right paraspinal mass measuring up to 5 cm in craniocaudal dimension. This appears stable since prior examination in 2022. 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 2. Moderate neuroforaminal stenosis bilaterally at L3-L4 and on the left at L4-L5. 3. Operative changes of posterior  spinal fusion from the thoracic spine through the level of L2.     Electronically Signed   By: Clem Savory M.D.   On: 04/29/2024 14:26    PATIENT SURVEYS:  MODI  COGNITION: Overall cognitive status: Within functional limits for tasks assessed   PALPATION: TTP to lumabr spine around L2 causes most pain  Lumbar contraction pattern  L Multifidus: poor contraction,  R Multifidus:poor contraction   SENSATION: WFL  MUSCLE LENGTH: Hamstrings: Right 10d; deg; Left 10d deg    POSTURE: right pelvic obliquity and flexed trunk    LUMBAR ROM: unable to tolerate full assessment at eval  AROM eval  Flexion   Extension   Right lateral flexion   Left lateral flexion   Right rotation   Left rotation    (Blank rows = not tested)  ! Indicates pain with testing  LOWER EXTREMITY ROM:   unable to tolerate testing at eval  Active  Right eval Left eval  Hip flexion    Hip extension    Hip abduction    Hip adduction    Hip internal rotation    Hip external rotation    Knee flexion    Knee extension    Ankle dorsiflexion    Ankle plantarflexion    Ankle inversion    Ankle eversion     (Blank rows = not tested)  ! Indicates pain with testing  LOWER EXTREMITY MMT:    MMT Right eval Left eval  Hip flexion 4 4-  Hip extension 4 4  Hip abduction 4 4  Hip adduction    Hip internal rotation    Hip external rotation    Knee flexion 4+ 4  Knee extension 4- 5  Ankle dorsiflexion    Ankle plantarflexion    Ankle inversion    Ankle eversion     (Blank rows = not tested)   ! Indicates pain with testing LUMBAR SPECIAL TESTS:  Prone instability test: Positive  FUNCTIONAL TESTS:  5 times sit to stand: 23s  GAIT: Distance walked: 100' Assistive device utilized: None Level of assistance: Complete Independence Comments: decreased step length, flexed trunk  [facility] Adult PT Treatment:                                                DATE: 07/17/24  Neuromuscular  re-ed: Supine hip fallouts RTB 15x B, 15/15 Supine march RTB 15/15 STS from airex pad 10x arms crossed Therapeutic Activity: Seated  hamstring stretch 30s x2 B Supine P-ball press 3s 10x B, 10/10 Supine R hip flexor stretch 30s x2  [facility] Adult PT Treatment:                                                DATE: 06/25/2024 Self Care: POC discussion Pt education                                                                                                                                PATIENT EDUCATION:  Education details: Pt received education regarding HEP performance, ADL performance, functional activity tolerance, impairment education, appropriate performance of therapeutic activities.  Person educated: Patient Education method: Explanation, Demonstration, Tactile cues, Verbal cues, and Handouts Education comprehension: verbalized understanding and returned demonstration  HOME EXERCISE PROGRAM: Access Code: [email or web address] URL: [email or web address] Date: 07/17/2024 Prepared by: Reyes Kohut  Exercises - Diaphragmatic Breathing with Pelvic Tilt in Hooklying  - 1-3 x daily - 7 x weekly - 2 sets - 25 reps - Supine Bridge  - 1 x daily - 4 x weekly - 2-3 sets - 12 reps - 4s hold - Sit to Stand  - 1 x daily - 4 x weekly - 2-3 sets - 10 reps - Supine March  - 1 x daily - 4 x weekly - 3 sets - 15 reps - Hooklying Single Leg Bent Knee Fallouts with Resistance  - 1 x daily - 5 x weekly - 1 sets - 15 reps - Seated Hamstring Stretch  - 1 x daily - 5 x weekly - 1 sets - 2 reps - 30s hold ---------------------------------------------------------------------------------------------  ASSESSMENT:  CLINICAL IMPRESSION:  First f/u session consisted of HEP review, lumbosacral strengthening against t-band resistance and stretching as tolerated.  Incorporated isometric abdominal and core work.  Sporadic R low back pain noted with positional changes.  Eval impression (06/25/2024): Pt.  attended today's physical therapy session for evaluation of LBP. Pt has complaints of back pain onset since 1980 that has recently gotten worse below their harrington rod. Pt has notable deficits and would benefit from therapeutic focus on lumbar stability, positional intolerance to prone>supine, proximal hip strength,posture, transfer quality, and general activity tolerance.  Today's treatment focus primarily on pt education detailed in objective. Pt demonstrated good understanding of education provided. required minimal v/t cues and moderate assistance for appropriate performance with today's bed mobility from supine to sit. Pt requires the intervention of skilled outpatient physical therapy to address the aforementioned deficits and progress towards a functional level in line with therapeutic goals.    OBJECTIVE IMPAIRMENTS: Abnormal gait, decreased activity tolerance, decreased mobility, difficulty walking, decreased ROM, decreased strength, impaired perceived functional ability, improper body mechanics, postural dysfunction, and pain.   ACTIVITY LIMITATIONS: carrying, lifting, bending, sitting, standing, squatting, sleeping, transfers, bed mobility, and locomotion level  PARTICIPATION LIMITATIONS: cleaning, laundry, driving, shopping, community activity, and occupation  PERSONAL FACTORS: Age, Fitness, Past/current experiences, and Time since onset of injury/illness/exacerbation are also affecting patient's functional outcome.   REHAB POTENTIAL: Fair fusion since 1980, moderate degenerative changes along  multilevel lumbar spine  CLINICAL DECISION MAKING: Evolving/moderate complexity  EVALUATION COMPLEXITY: Moderate   GOALS: Goals reviewed with patient? YES  SHORT TERM GOALS: Target date: 07/16/2024    Pt will be independent with administered HEP to demonstrate the competency necessary for long term managemnet of symptoms at home. Baseline: [email or web address] Goal status: Ongoing  LONG TERM  GOALS: Target date: 08/06/2024  Pt. Will achieve a MODI score of 9/50 as to demonstrate improvement in self-perceived functional ability with daily activities.  Baseline: 15/50 Goal status: INITIAL  2.  Pt will improve Global hip strength to a 4+/5 to demonstrate improvement in strength for quality of motion and activity performance.  Baseline:  Goal status: INITIAL  3. Pt will report pain levels improving during ADLs to be less than or equal to 3/10 as to demonstrate improved tolerance with daily functional activities such as sitting and bending forward. Baseline: 6/10 Goal status: INITIAL  4.  Pt will improve x5 STS time to 18s to demonstrate improved transfer quality, BLE functional strength, and coordination. Baseline: 23s Goal status: INITIAL ---------------------------------------------------------------------------------------------  PLAN:  PT FREQUENCY: 1-2x/week  PT DURATION: 6 weeks  PLANNED INTERVENTIONS: 97110-Therapeutic exercises, 97530- Therapeutic activity, 97112- Neuromuscular re-education, 97535- Self Care, 02859- Manual therapy, [phone]- Gait training, [phone]- Aquatic Therapy, [phone] (1-2 muscles), [phone] (3+ muscles)- Dry Needling, Patient/Family education, Balance training, Stair training, Taping, Joint mobilization, Spinal mobilization, Cryotherapy, and Moist heat.  PLAN FOR NEXT SESSION: Review HEP, Begin POC as detailed in assessment   Chyrl Kohut PT  07/17/2024, 4:22 PM   .

## 2024-07-17 ENCOUNTER — Ambulatory Visit

## 2024-07-17 DIAGNOSIS — M5459 Other low back pain: Secondary | ICD-10-CM | POA: Diagnosis not present

## 2024-07-17 DIAGNOSIS — M6281 Muscle weakness (generalized): Secondary | ICD-10-CM

## 2024-07-18 LAB — CYTOLOGY - PAP
Comment: NEGATIVE
Diagnosis: NEGATIVE
High risk HPV: NEGATIVE

## 2024-07-21 ENCOUNTER — Ambulatory Visit: Payer: Self-pay | Admitting: Nurse Practitioner

## 2024-07-23 NOTE — Therapy (Unsigned)
 OUTPATIENT PHYSICAL THERAPY TREATMENT NOTE   Patient Name: Amber Green MRN: [id number] DOB:1966-05-18, 58 y.o., female Today's Date: 07/24/2024  END OF SESSION:  PT End of Session - 07/24/24 1618     Visit Number 3    Number of Visits 7    Date for Recertification  08/06/24    Authorization Type [employer]    PT Start Time 1615    PT Stop Time 1655    PT Time Calculation (min) 40 min    Activity Tolerance Patient tolerated treatment well    Behavior During Therapy [facility] for tasks assessed/performed            Past Medical History:  Diagnosis Date   Arthritis    Clostridium difficile colitis 2003   COVID 10/09/2022   Diverticulosis    GERD (gastroesophageal reflux disease)    Grief 01/01/2017   Hiatal hernia    Night terrors    Scoliosis    Past Surgical History:  Procedure Laterality Date   APPENDECTOMY  1987   Herrington rods  07/1979   RSO  1987   with appendectomy   SPINE SURGERY     Patient Active Problem List   Diagnosis Date Noted   OSA (obstructive sleep apnea) 06/23/2024   Adult BMI 31.0-31.9 kg/sq m 04/24/2024   Non-restorative sleep 04/24/2024   Snoring 04/24/2024   Fatigue 03/19/2024   Adnexal cyst 08/15/2023   Situational mixed anxiety and depressive disorder 08/15/2023   Pure hypercholesterolemia 08/15/2023   History of bowel infarction 07/31/2022   Family history of melanoma 05/29/2022   Melanocytic nevi of trunk 05/29/2022   Sensorineural hearing loss (SNHL) of right ear with restricted hearing of left ear 05/29/2022   Night terrors 05/29/2022   GERD (gastroesophageal reflux disease) 05/17/2021   Lipoma of left lower extremity 11/16/2017   H/O scoliosis 01/25/2017   Back pain 01/01/2017    PCP: Katheen Roselie [place], Np  REFERRING PROVIDER: Hilma Hastings, PA-C  REFERRING DIAG:  Diagnosis  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 Yes. Number of falls 1, walking backwards and tripped on a curb.   LIVING ENVIRONMENT: Lives with: lives with their family Lives in: House/apartment Stairs: Yes: External: 3 steps; none Has following equipment at home: None  OCCUPATION: Photographer   PRECAUTIONS: None ---------------------------------------------------------------------------------------------  SUBJECTIVE:  SUBJECTIVE STATEMENT: No change since last session but has been out of town and unable to perform HEP as often as needed.  RED FLAGS: None    PLOF: Independent  PATIENT GOALS: minimize pain  NEXT MD VISIT: need to schedule. ---------------------------------------------------------------------------------------------  OBJECTIVE:  Note: Objective measures were completed at Evaluation unless otherwise noted.  DIAGNOSTIC FINDINGS:  IMPRESSION: 1. Enhancing right paraspinal mass measuring up to 5 cm in craniocaudal dimension. This appears stable since prior examination in 2022. 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 2. Moderate neuroforaminal stenosis bilaterally at L3-L4 and on the left at L4-L5. 3. Operative changes of posterior spinal fusion from the thoracic spine through the level of L2.     Electronically Signed   By: Clem Savory M.D.   On: 04/29/2024 14:26    PATIENT SURVEYS:  MODI  COGNITION: Overall cognitive status: Within functional limits for tasks  assessed   PALPATION: TTP to lumabr spine around L2 causes most pain  Lumbar contraction pattern  L Multifidus: poor contraction,  R Multifidus:poor contraction   SENSATION: WFL  MUSCLE LENGTH: Hamstrings: Right 10d; deg; Left 10d deg    POSTURE: right pelvic obliquity and flexed trunk    LUMBAR ROM: unable to tolerate full assessment at eval  AROM eval  Flexion   Extension   Right lateral flexion   Left lateral flexion   Right rotation   Left rotation    (Blank rows = not tested)  ! Indicates pain with testing  LOWER EXTREMITY ROM:   unable to tolerate testing at eval  Active  Right eval Left eval  Hip flexion    Hip extension    Hip abduction    Hip adduction    Hip internal rotation    Hip external rotation    Knee flexion    Knee extension    Ankle dorsiflexion    Ankle plantarflexion    Ankle inversion    Ankle eversion     (Blank rows = not tested)  ! Indicates pain with testing  LOWER EXTREMITY MMT:    MMT Right eval Left eval  Hip flexion 4 4-  Hip extension 4 4  Hip abduction 4 4  Hip adduction    Hip internal rotation    Hip external rotation    Knee flexion 4+ 4  Knee extension 4- 5  Ankle dorsiflexion    Ankle plantarflexion    Ankle inversion    Ankle eversion     (Blank rows = not tested)   ! Indicates pain with testing LUMBAR SPECIAL TESTS:  Prone instability test: Positive  FUNCTIONAL TESTS:  5 times sit to stand: 23s  GAIT: Distance walked: 100' Assistive device utilized: None Level of assistance: Complete Independence Comments: decreased step length, flexed trunk  [facility] Adult PT Treatment:                                                DATE: 07/24/24 Therapeutic Exercise: Nustep L2 6 min Neuromuscular re-ed: Supine hip fallouts GTB 15x B, 15/15 Supine march 15/15 STS from airex pad 10x arms crossed Seated hip toss 10/10 no weight Therapeutic Activity: Supine QL stretch 30s x2 B Supine core via B shoulder  flexion YTB 15x Supine R hip flexor stretch 30s x2  [facility] Adult PT Treatment:  DATE: 07/17/24  Neuromuscular re-ed: Supine hip fallouts RTB 15x B, 15/15 Supine march RTB 15/15 STS from airex pad 10x arms crossed Therapeutic Activity: Seated hamstring stretch 30s x2 B Supine P-ball press 3s 10x B, 10/10 Supine R hip flexor stretch 30s x2  [facility] Adult PT Treatment:                                                DATE: 06/25/2024 Self Care: POC discussion Pt education                                                                                                                                PATIENT EDUCATION:  Education details: Pt received education regarding HEP performance, ADL performance, functional activity tolerance, impairment education, appropriate performance of therapeutic activities.  Person educated: Patient Education method: Explanation, Demonstration, Tactile cues, Verbal cues, and Handouts Education comprehension: verbalized understanding and returned demonstration  HOME EXERCISE PROGRAM: Access Code: [email or web address] URL: [email or web address] Date: 07/17/2024 Prepared by: Reyes Kohut  Exercises - Diaphragmatic Breathing with Pelvic Tilt in Hooklying  - 1-3 x daily - 7 x weekly - 2 sets - 25 reps - Supine Bridge  - 1 x daily - 4 x weekly - 2-3 sets - 12 reps - 4s hold - Sit to Stand  - 1 x daily - 4 x weekly - 2-3 sets - 10 reps - Supine March  - 1 x daily - 4 x weekly - 3 sets - 15 reps - Hooklying Single Leg Bent Knee Fallouts with Resistance  - 1 x daily - 5 x weekly - 1 sets - 15 reps - Seated Hamstring Stretch  - 1 x daily - 5 x weekly - 1 sets - 2 reps - 30s hold ---------------------------------------------------------------------------------------------  ASSESSMENT:  CLINICAL IMPRESSION:  Focus of session was continued core strengthening and flexibility tasks.  Able to engage in aerobic w/u and  advance to more resistive theraband.  Continued to mobilize lumbosacral and hip region, core exercises as tolerated.  Less guarding/discomfort noted during today's session  Eval impression (06/25/2024): Pt. attended today's physical therapy session for evaluation of LBP. Pt has complaints of back pain onset since 1980 that has recently gotten worse below their harrington rod. Pt has notable deficits and would benefit from therapeutic focus on lumbar stability, positional intolerance to prone>supine, proximal hip strength,posture, transfer quality, and general activity tolerance.  Today's treatment focus primarily on pt education detailed in objective. Pt demonstrated good understanding of education provided. required minimal v/t cues and moderate assistance for appropriate performance with today's bed mobility from supine to sit. Pt requires the intervention of skilled outpatient physical therapy to address the aforementioned deficits and progress towards a functional level in line with therapeutic goals.    OBJECTIVE IMPAIRMENTS: Abnormal gait, decreased activity tolerance, decreased  mobility, difficulty walking, decreased ROM, decreased strength, impaired perceived functional ability, improper body mechanics, postural dysfunction, and pain.   ACTIVITY LIMITATIONS: carrying, lifting, bending, sitting, standing, squatting, sleeping, transfers, bed mobility, and locomotion level  PARTICIPATION LIMITATIONS: cleaning, laundry, driving, shopping, community activity, and occupation  PERSONAL FACTORS: Age, Fitness, Past/current experiences, and Time since onset of injury/illness/exacerbation are also affecting patient's functional outcome.   REHAB POTENTIAL: Fair fusion since 1980, moderate degenerative changes along  multilevel lumbar spine  CLINICAL DECISION MAKING: Evolving/moderate complexity  EVALUATION COMPLEXITY: Moderate   GOALS: Goals reviewed with patient? YES  SHORT TERM GOALS: Target  date: 07/16/2024    Pt will be independent with administered HEP to demonstrate the competency necessary for long term managemnet of symptoms at home. Baseline: [email or web address] Goal status: Ongoing  LONG TERM GOALS: Target date: 08/06/2024  Pt. Will achieve a MODI score of 9/50 as to demonstrate improvement in self-perceived functional ability with daily activities.  Baseline: 15/50 Goal status: INITIAL  2.  Pt will improve Global hip strength to a 4+/5 to demonstrate improvement in strength for quality of motion and activity performance.  Baseline:  Goal status: INITIAL  3. Pt will report pain levels improving during ADLs to be less than or equal to 3/10 as to demonstrate improved tolerance with daily functional activities such as sitting and bending forward. Baseline: 6/10 Goal status: INITIAL  4.  Pt will improve x5 STS time to 18s to demonstrate improved transfer quality, BLE functional strength, and coordination. Baseline: 23s Goal status: INITIAL ---------------------------------------------------------------------------------------------  PLAN:  PT FREQUENCY: 1-2x/week  PT DURATION: 6 weeks  PLANNED INTERVENTIONS: 97110-Therapeutic exercises, 97530- Therapeutic activity, 97112- Neuromuscular re-education, 97535- Self Care, 02859- Manual therapy, [phone]- Gait training, [phone]- Aquatic Therapy, [phone] (1-2 muscles), 20561 (3+ muscles)- Dry Needling, Patient/Family education, Balance training, Stair training, Taping, Joint mobilization, Spinal mobilization, Cryotherapy, and Moist heat.  PLAN FOR NEXT SESSION: Review HEP, Begin POC as detailed in assessment   Chyrl Kohut PT  07/24/2024, 5:03 PM   .

## 2024-07-24 ENCOUNTER — Ambulatory Visit: Attending: Orthopedic Surgery

## 2024-07-24 DIAGNOSIS — M5459 Other low back pain: Secondary | ICD-10-CM | POA: Insufficient documentation

## 2024-07-24 DIAGNOSIS — M6281 Muscle weakness (generalized): Secondary | ICD-10-CM | POA: Insufficient documentation

## 2024-07-25 NOTE — Therapy (Signed)
 OUTPATIENT PHYSICAL THERAPY TREATMENT NOTE   Patient Name: Amber Green MRN: [id number] DOB:[birth year], 58 y.o., female Today's Date: 07/28/2024  END OF SESSION:  PT End of Session - 07/28/24 1705     Visit Number 4    Number of Visits 7    Date for Recertification  08/06/24    Authorization Type [employer]et[employer]    PT Start Time 1700    PT Stop Time 1745    PT Time Calculation (min) 45 min    Activity Tolerance Patient tolerated treatment well    Behavior During Therapy [facility] for tasks assessed/performed             Past Medical History:  Diagnosis Date   Arthritis    Clostridium difficile colitis 2003   COVID 10/09/2022   Diverticulosis    GERD (gastroesophageal reflux disease)    Grief 01/01/2017   Hiatal hernia    Night terrors    Scoliosis    Past Surgical History:  Procedure Laterality Date   APPENDECTOMY  1987   Herrington rods  07/1979   RSO  1987   with appendectomy   SPINE SURGERY     Patient Active Problem List   Diagnosis Date Noted   OSA (obstructive sleep apnea) 06/23/2024   Adult BMI 31.0-31.9 kg/sq m 04/24/2024   Non-restorative sleep 04/24/2024   Snoring 04/24/2024   Fatigue 03/19/2024   Adnexal cyst 08/15/2023   Situational mixed anxiety and depressive disorder 08/15/2023   Pure hypercholesterolemia 08/15/2023   History of bowel infarction 07/31/2022   Family history of melanoma 05/29/2022   Melanocytic nevi of trunk 05/29/2022   Sensorineural hearing loss (SNHL) of right ear with restricted hearing of left ear 05/29/2022   Night terrors 05/29/2022   GERD (gastroesophageal reflux disease) 05/17/2021   Lipoma of left lower extremity 11/16/2017   H/O scoliosis 01/25/2017   Back pain 01/01/2017    PCP: Katheen Roselie [place], Np  REFERRING PROVIDER: Hilma Hastings, PA-C  REFERRING DIAG:  Diagnosis  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 Yes. Number of falls 1, walking backwards and tripped on a curb.   LIVING ENVIRONMENT: Lives with: lives with their family Lives in: House/apartment Stairs: Yes: External: 3 steps; none Has following equipment at home: None  OCCUPATION: Photographer   PRECAUTIONS: None ---------------------------------------------------------------------------------------------  SUBJECTIVE:  SUBJECTIVE STATEMENT: No change since last session but has been out of town and unable to perform HEP as often as needed.  RED FLAGS: None    PLOF: Independent  PATIENT GOALS: minimize pain  NEXT MD VISIT: need to schedule. ---------------------------------------------------------------------------------------------  OBJECTIVE:  Note: Objective measures were completed at Evaluation unless otherwise noted.  DIAGNOSTIC FINDINGS:  IMPRESSION: 1. Enhancing right paraspinal mass measuring up to 5 cm in craniocaudal dimension. This appears stable since prior examination in 2022. 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 2. Moderate neuroforaminal stenosis bilaterally at L3-L4 and on the left at L4-L5. 3. Operative changes of posterior spinal fusion from the thoracic spine through the level of L2.     Electronically Signed   By: Clem Savory M.D.   On: 04/29/2024 14:26    PATIENT SURVEYS:  MODI  COGNITION: Overall cognitive status: Within functional limits for tasks  assessed   PALPATION: TTP to lumabr spine around L2 causes most pain  Lumbar contraction pattern  L Multifidus: poor contraction,  R Multifidus:poor contraction   SENSATION: WFL  MUSCLE LENGTH: Hamstrings: Right 10d; deg; Left 10d deg    POSTURE: right pelvic obliquity and flexed trunk    LUMBAR ROM: unable to tolerate full assessment at eval  AROM eval  Flexion   Extension   Right lateral flexion   Left lateral flexion   Right rotation   Left rotation    (Blank rows = not tested)  ! Indicates pain with testing  LOWER EXTREMITY ROM:   unable to tolerate testing at eval  Active  Right eval Left eval  Hip flexion    Hip extension    Hip abduction    Hip adduction    Hip internal rotation    Hip external rotation    Knee flexion    Knee extension    Ankle dorsiflexion    Ankle plantarflexion    Ankle inversion    Ankle eversion     (Blank rows = not tested)  ! Indicates pain with testing  LOWER EXTREMITY MMT:    MMT Right eval Left eval  Hip flexion 4 4-  Hip extension 4 4  Hip abduction 4 4  Hip adduction    Hip internal rotation    Hip external rotation    Knee flexion 4+ 4  Knee extension 4- 5  Ankle dorsiflexion    Ankle plantarflexion    Ankle inversion    Ankle eversion     (Blank rows = not tested)   ! Indicates pain with testing LUMBAR SPECIAL TESTS:  Prone instability test: Positive  FUNCTIONAL TESTS:  5 times sit to stand: 23s  GAIT: Distance walked: 100' Assistive device utilized: None Level of assistance: Complete Independence Comments: decreased step length, flexed trunk  [facility] Adult PT Treatment:                                                DATE: 07/28/24 Therapeutic Exercise: Nustep L4 8 min Neuromuscular re-ed: Supine hip fallouts GTB 15x B, 15/15 PPT 3s 10x Supine march 15/15 w/PPT STS from airex pad 10x arms crossed Therapeutic Activity: Seated hamstring stretch 30s B Supine R hip flexor stretch 30s x2 P-ball  curls ups 10x B, 10/10  [facility] Adult PT Treatment:  DATE: 07/24/24 Therapeutic Exercise: Nustep L2 6 min Neuromuscular re-ed: Supine hip fallouts GTB 15x B, 15/15 Supine march 15/15 STS from airex pad 10x arms crossed Seated hip toss 10/10 no weight Therapeutic Activity: Supine QL stretch 30s x2 B Supine core via B shoulder flexion YTB 15x Supine R hip flexor stretch 30s x2  [facility] Adult PT Treatment:                                                DATE: 07/17/24  Neuromuscular re-ed: Supine hip fallouts RTB 15x B, 15/15 Supine march RTB 15/15 STS from airex pad 10x arms crossed Therapeutic Activity: Seated hamstring stretch 30s x2 B Supine P-ball press 3s 10x B, 10/10 Supine R hip flexor stretch 30s x2  [facility] Adult PT Treatment:                                                DATE: 06/25/2024 Self Care: POC discussion Pt education                                                                                                                                PATIENT EDUCATION:  Education details: Pt received education regarding HEP performance, ADL performance, functional activity tolerance, impairment education, appropriate performance of therapeutic activities.  Person educated: Patient Education method: Explanation, Demonstration, Tactile cues, Verbal cues, and Handouts Education comprehension: verbalized understanding and returned demonstration  HOME EXERCISE PROGRAM: Access Code: [email or web address] URL: [email or web address] Date: 07/17/2024 Prepared by: Reyes Kohut  Exercises - Diaphragmatic Breathing with Pelvic Tilt in Hooklying  - 1-3 x daily - 7 x weekly - 2 sets - 25 reps - Supine Bridge  - 1 x daily - 4 x weekly - 2-3 sets - 12 reps - 4s hold - Sit to Stand  - 1 x daily - 4 x weekly - 2-3 sets - 10 reps - Supine March  - 1 x daily - 4 x weekly - 3 sets - 15 reps - Hooklying Single Leg Bent Knee Fallouts with  Resistance  - 1 x daily - 5 x weekly - 1 sets - 15 reps - Seated Hamstring Stretch  - 1 x daily - 5 x weekly - 1 sets - 2 reps - 30s hold ---------------------------------------------------------------------------------------------  ASSESSMENT:  CLINICAL IMPRESSION:  Continued with core strengthening tasks adding PPT and combining it with LE tasks for functional training.  Issued more resistive bands for HEP and reviewed proper form with exercises.  Continued pain in R QL region most prominent with position changes. Able to advance to p-ball curl ups to further strengthen core.    Eval impression (06/25/2024): Pt. attended today's physical therapy session  for evaluation of LBP. Pt has complaints of back pain onset since 1980 that has recently gotten worse below their harrington rod. Pt has notable deficits and would benefit from therapeutic focus on lumbar stability, positional intolerance to prone>supine, proximal hip strength,posture, transfer quality, and general activity tolerance.  Today's treatment focus primarily on pt education detailed in objective. Pt demonstrated good understanding of education provided. required minimal v/t cues and moderate assistance for appropriate performance with today's bed mobility from supine to sit. Pt requires the intervention of skilled outpatient physical therapy to address the aforementioned deficits and progress towards a functional level in line with therapeutic goals.    OBJECTIVE IMPAIRMENTS: Abnormal gait, decreased activity tolerance, decreased mobility, difficulty walking, decreased ROM, decreased strength, impaired perceived functional ability, improper body mechanics, postural dysfunction, and pain.   ACTIVITY LIMITATIONS: carrying, lifting, bending, sitting, standing, squatting, sleeping, transfers, bed mobility, and locomotion level  PARTICIPATION LIMITATIONS: cleaning, laundry, driving, shopping, community activity, and occupation  PERSONAL  FACTORS: Age, Fitness, Past/current experiences, and Time since onset of injury/illness/exacerbation are also affecting patient's functional outcome.   REHAB POTENTIAL: Fair fusion since 1980, moderate degenerative changes along  multilevel lumbar spine  CLINICAL DECISION MAKING: Evolving/moderate complexity  EVALUATION COMPLEXITY: Moderate   GOALS: Goals reviewed with patient? YES  SHORT TERM GOALS: Target date: 07/16/2024    Pt will be independent with administered HEP to demonstrate the competency necessary for long term managemnet of symptoms at home. Baseline: [email or web address] Goal status: Ongoing  LONG TERM GOALS: Target date: 08/06/2024  Pt. Will achieve a MODI score of 9/50 as to demonstrate improvement in self-perceived functional ability with daily activities.  Baseline: 15/50 Goal status: INITIAL  2.  Pt will improve Global hip strength to a 4+/5 to demonstrate improvement in strength for quality of motion and activity performance.  Baseline:  Goal status: INITIAL  3. Pt will report pain levels improving during ADLs to be less than or equal to 3/10 as to demonstrate improved tolerance with daily functional activities such as sitting and bending forward. Baseline: 6/10 Goal status: INITIAL  4.  Pt will improve x5 STS time to 18s to demonstrate improved transfer quality, BLE functional strength, and coordination. Baseline: 23s Goal status: INITIAL ---------------------------------------------------------------------------------------------  PLAN:  PT FREQUENCY: 1-2x/week  PT DURATION: 6 weeks  PLANNED INTERVENTIONS: 97110-Therapeutic exercises, 97530- Therapeutic activity, 97112- Neuromuscular re-education, 97535- Self Care, 02859- Manual therapy, [phone]- Gait training, [phone]- Aquatic Therapy, [phone] (1-2 muscles), [phone] (3+ muscles)- Dry Needling, Patient/Family education, Balance training, Stair training, Taping, Joint mobilization, Spinal mobilization, Cryotherapy, and  Moist heat.  PLAN FOR NEXT SESSION: Review HEP, Begin POC as detailed in assessment   Chyrl Kohut PT  07/28/2024, 5:50 PM   .

## 2024-07-28 ENCOUNTER — Ambulatory Visit

## 2024-07-28 DIAGNOSIS — M5459 Other low back pain: Secondary | ICD-10-CM | POA: Diagnosis not present

## 2024-07-28 DIAGNOSIS — M6281 Muscle weakness (generalized): Secondary | ICD-10-CM

## 2024-08-03 ENCOUNTER — Encounter: Payer: Self-pay | Admitting: Nurse Practitioner

## 2024-08-03 DIAGNOSIS — Z87898 Personal history of other specified conditions: Secondary | ICD-10-CM

## 2024-08-04 MED ORDER — SCOPOLAMINE 1 MG/3DAYS TD PT72
1.0000 | MEDICATED_PATCH | TRANSDERMAL | 0 refills | Status: AC
Start: 2024-08-04 — End: ?

## 2024-08-11 NOTE — Therapy (Addendum)
 " OUTPATIENT PHYSICAL THERAPY TREATMENT NOTE/DISCHARGE   Patient Name: Amber Green MRN: [id number] DOB:Oct 17, 1965, 58 y.o., female Today's Date: 08/12/2024  END OF SESSION:  PT End of Session - 08/12/24 1623     Visit Number 5    Number of Visits 7    Date for Recertification  08/06/24    Authorization Type Aetna    PT Start Time 1620    PT Stop Time 1700    PT Time Calculation (min) 40 min    Activity Tolerance Patient tolerated treatment well    Behavior During Therapy [facility] for tasks assessed/performed              Past Medical History:  Diagnosis Date   Arthritis    Clostridium difficile colitis 2003   COVID 10/09/2022   Diverticulosis    GERD (gastroesophageal reflux disease)    Grief 01/01/2017   Hiatal hernia    Night terrors    Scoliosis    Past Surgical History:  Procedure Laterality Date   APPENDECTOMY  1987   Herrington rods  07/1979   RSO  1987   with appendectomy   SPINE SURGERY     Patient Active Problem List   Diagnosis Date Noted   OSA (obstructive sleep apnea) 06/23/2024   Adult BMI 31.0-31.9 kg/sq m 04/24/2024   Non-restorative sleep 04/24/2024   Snoring 04/24/2024   Fatigue 03/19/2024   Adnexal cyst 08/15/2023   Situational mixed anxiety and depressive disorder 08/15/2023   Pure hypercholesterolemia 08/15/2023   History of bowel infarction 07/31/2022   Family history of melanoma 05/29/2022   Melanocytic nevi of trunk 05/29/2022   Sensorineural hearing loss (SNHL) of right ear with restricted hearing of left ear 05/29/2022   Night terrors 05/29/2022   GERD (gastroesophageal reflux disease) 05/17/2021   Lipoma of left lower extremity 11/16/2017   H/O scoliosis 01/25/2017   Back pain 01/01/2017    PCP: Katheen Roselie [place], Np  REFERRING PROVIDER: Hilma Hastings, PA-C  REFERRING DIAG:  Diagnosis  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 Yes. Number of falls 1, walking backwards and tripped on a curb.   LIVING ENVIRONMENT: Lives with: lives with their family Lives in: House/apartment Stairs: Yes: External: 3 steps; none Has following equipment at home: None  OCCUPATION: Photographer   PRECAUTIONS: None ---------------------------------------------------------------------------------------------  SUBJECTIVE:  SUBJECTIVE STATEMENT: Pain levels remain unchanged but has been more active w/o increased symptoms.  Has begun a gym program focused on aerobic function in preparation for a cruise in Feb.  RED FLAGS: None    PLOF: Independent  PATIENT GOALS: minimize pain  NEXT MD VISIT: need to schedule. ---------------------------------------------------------------------------------------------  OBJECTIVE:  Note: Objective measures were completed at Evaluation unless otherwise noted.  DIAGNOSTIC FINDINGS:  IMPRESSION: 1. Enhancing right paraspinal mass measuring up to 5 cm in craniocaudal dimension. This appears stable since prior examination in 2022. 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 2. Moderate neuroforaminal stenosis bilaterally at L3-L4 and on the left at L4-L5. 3. Operative changes of posterior spinal fusion from the thoracic spine through the level of L2.     Electronically Signed   By: Clem Savory M.D.   On: 04/29/2024 14:26    PATIENT SURVEYS:   MODI  COGNITION: Overall cognitive status: Within functional limits for tasks assessed   PALPATION: TTP to lumabr spine around L2 causes most pain  Lumbar contraction pattern  L Multifidus: poor contraction,  R Multifidus:poor contraction   SENSATION: WFL  MUSCLE LENGTH: Hamstrings: Right 10d; deg; Left 10d deg    POSTURE: right pelvic obliquity and flexed trunk    LUMBAR ROM: unable to tolerate full assessment at eval  AROM eval  Flexion   Extension   Right lateral flexion   Left lateral flexion   Right rotation   Left rotation    (Blank rows = not tested)  ! Indicates pain with testing  LOWER EXTREMITY ROM:   unable to tolerate testing at eval  Active  Right eval Left eval  Hip flexion    Hip extension    Hip abduction    Hip adduction    Hip internal rotation    Hip external rotation    Knee flexion    Knee extension    Ankle dorsiflexion    Ankle plantarflexion    Ankle inversion    Ankle eversion     (Blank rows = not tested)  ! Indicates pain with testing  LOWER EXTREMITY MMT:    MMT Right eval Left eval  Hip flexion 4 4-  Hip extension 4 4  Hip abduction 4 4  Hip adduction    Hip internal rotation    Hip external rotation    Knee flexion 4+ 4  Knee extension 4- 5  Ankle dorsiflexion    Ankle plantarflexion    Ankle inversion    Ankle eversion     (Blank rows = not tested)   ! Indicates pain with testing LUMBAR SPECIAL TESTS:  Prone instability test: Positive  FUNCTIONAL TESTS:  5 times sit to stand: 23s  GAIT: Distance walked: 100' Assistive device utilized: None Level of assistance: Complete Independence Comments: decreased step length, flexed trunk  [facility] Adult PT Treatment:                                                DATE: 08/12/24 Therapeutic Exercise: Nustep L6 8 min 60 SPM Neuromuscular re-ed: Supine hip fallouts BluTB 15x B, 15/15 Dead bug with P-ball 10/10 Heel raise from 4 in step 10x Therapeutic  Activity: Seated hamstring stretch 30s x2 B Supine R hip flexor stretch 30s x2 Runners step 4 in 10/10  [facility] Adult PT Treatment:  DATE: 07/28/24 Therapeutic Exercise: Nustep L4 8 min Neuromuscular re-ed: Supine hip fallouts GTB 15x B, 15/15 PPT 3s 10x Supine march 15/15 w/PPT STS from airex pad 10x arms crossed Therapeutic Activity: Seated hamstring stretch 30s B Supine R hip flexor stretch 30s x2 P-ball curls ups 10x B, 10/10  [facility] Adult PT Treatment:                                                DATE: 07/24/24 Therapeutic Exercise: Nustep L2 6 min Neuromuscular re-ed: Supine hip fallouts GTB 15x B, 15/15 Supine march 15/15 STS from airex pad 10x arms crossed Seated hip toss 10/10 no weight Therapeutic Activity: Supine QL stretch 30s x2 B Supine core via B shoulder flexion YTB 15x Supine R hip flexor stretch 30s x2  [facility] Adult PT Treatment:                                                DATE: 07/17/24  Neuromuscular re-ed: Supine hip fallouts RTB 15x B, 15/15 Supine march RTB 15/15 STS from airex pad 10x arms crossed Therapeutic Activity: Seated hamstring stretch 30s x2 B Supine P-ball press 3s 10x B, 10/10 Supine R hip flexor stretch 30s x2  [facility] Adult PT Treatment:                                                DATE: 06/25/2024 Self Care: POC discussion Pt education                                                                                                                                PATIENT EDUCATION:  Education details: Pt received education regarding HEP performance, ADL performance, functional activity tolerance, impairment education, appropriate performance of therapeutic activities.  Person educated: Patient Education method: Explanation, Demonstration, Tactile cues, Verbal cues, and Handouts Education comprehension: verbalized understanding and returned demonstration  HOME EXERCISE PROGRAM: Access  Code: [email or web address] URL: [email or web address] Date: 07/17/2024 Prepared by: Korion Cuevas  Exercises - Diaphragmatic Breathing with Pelvic Tilt in Hooklying  - 1-3 x daily - 7 x weekly - 2 sets - 25 reps - Supine Bridge  - 1 x daily - 4 x weekly - 2-3 sets - 12 reps - 4s hold - Sit to Stand  - 1 x daily - 4 x weekly - 2-3 sets - 10 reps - Supine March  - 1 x daily - 4 x weekly - 3 sets - 15 reps - Hooklying Single Leg Bent Knee Fallouts with Resistance  - 1 x daily -  5 x weekly - 1 sets - 15 reps - Seated Hamstring Stretch  - 1 x daily - 5 x weekly - 1 sets - 2 reps - 30s hold ---------------------------------------------------------------------------------------------  ASSESSMENT:  CLINICAL IMPRESSION:  Advanced to WB tasks and stepping activity.  Increased resistance and challenge as noted.  Continues to experience symptoms with R hip flexor stretching but able to tolerate runners step.  Eval impression (06/25/2024): Pt. attended today's physical therapy session for evaluation of LBP. Pt has complaints of back pain onset since 1980 that has recently gotten worse below their harrington rod. Pt has notable deficits and would benefit from therapeutic focus on lumbar stability, positional intolerance to prone>supine, proximal hip strength,posture, transfer quality, and general activity tolerance.  Today's treatment focus primarily on pt education detailed in objective. Pt demonstrated good understanding of education provided. required minimal v/t cues and moderate assistance for appropriate performance with today's bed mobility from supine to sit. Pt requires the intervention of skilled outpatient physical therapy to address the aforementioned deficits and progress towards a functional level in line with therapeutic goals.    OBJECTIVE IMPAIRMENTS: Abnormal gait, decreased activity tolerance, decreased mobility, difficulty walking, decreased ROM, decreased strength, impaired perceived  functional ability, improper body mechanics, postural dysfunction, and pain.   ACTIVITY LIMITATIONS: carrying, lifting, bending, sitting, standing, squatting, sleeping, transfers, bed mobility, and locomotion level  PARTICIPATION LIMITATIONS: cleaning, laundry, driving, shopping, community activity, and occupation  PERSONAL FACTORS: Age, Fitness, Past/current experiences, and Time since onset of injury/illness/exacerbation are also affecting patient's functional outcome.   REHAB POTENTIAL: Fair fusion since 1980, moderate degenerative changes along  multilevel lumbar spine  CLINICAL DECISION MAKING: Evolving/moderate complexity  EVALUATION COMPLEXITY: Moderate   GOALS: Goals reviewed with patient? YES  SHORT TERM GOALS: Target date: 07/16/2024    Pt will be independent with administered HEP to demonstrate the competency necessary for long term managemnet of symptoms at home. Baseline: [email or web address] Goal status: Ongoing  LONG TERM GOALS: Target date: 08/06/2024  Pt. Will achieve a MODI score of 9/50 as to demonstrate improvement in self-perceived functional ability with daily activities.  Baseline: 15/50 Goal status: INITIAL  2.  Pt will improve Global hip strength to a 4+/5 to demonstrate improvement in strength for quality of motion and activity performance.  Baseline:  Goal status: INITIAL  3. Pt will report pain levels improving during ADLs to be less than or equal to 3/10 as to demonstrate improved tolerance with daily functional activities such as sitting and bending forward. Baseline: 6/10 Goal status: INITIAL  4.  Pt will improve x5 STS time to 18s to demonstrate improved transfer quality, BLE functional strength, and coordination. Baseline: 23s Goal status: INITIAL ---------------------------------------------------------------------------------------------  PLAN:  PT FREQUENCY: 1-2x/week  PT DURATION: 6 weeks  PLANNED INTERVENTIONS: 97110-Therapeutic exercises,  97530- Therapeutic activity, 97112- Neuromuscular re-education, 97535- Self Care, 02859- Manual therapy, [phone]- Gait training, [phone]- Aquatic Therapy, [phone] (1-2 muscles), 20561 (3+ muscles)- Dry Needling, Patient/Family education, Balance training, Stair training, Taping, Joint mobilization, Spinal mobilization, Cryotherapy, and Moist heat.  PLAN FOR NEXT SESSION: Review HEP, Begin POC as detailed in assessment   Chyrl Kohut PT  08/12/2024, 4:59 PM   .  "

## 2024-08-12 ENCOUNTER — Ambulatory Visit

## 2024-08-12 DIAGNOSIS — M6281 Muscle weakness (generalized): Secondary | ICD-10-CM

## 2024-08-12 DIAGNOSIS — M5459 Other low back pain: Secondary | ICD-10-CM | POA: Diagnosis not present

## 2024-08-19 ENCOUNTER — Ambulatory Visit: Admitting: Orthopedic Surgery

## 2024-08-20 ENCOUNTER — Ambulatory Visit

## 2024-08-22 NOTE — Therapy (Deleted)
 OUTPATIENT PHYSICAL THERAPY TREATMENT NOTE   Patient Name: Amber Green MRN: [id number] DOB:25-Jan-1966, 58 y.o., female Today's Date: 08/22/2024  END OF SESSION:        Past Medical History:  Diagnosis Date   Arthritis    Clostridium difficile colitis 2003   COVID 10/09/2022   Diverticulosis    GERD (gastroesophageal reflux disease)    Grief 01/01/2017   Hiatal hernia    Night terrors    Scoliosis    Past Surgical History:  Procedure Laterality Date   APPENDECTOMY  1987   Herrington rods  07/1979   RSO  1987   with appendectomy   SPINE SURGERY     Patient Active Problem List   Diagnosis Date Noted   OSA (obstructive sleep apnea) 06/23/2024   Adult BMI 31.0-31.9 kg/sq m 04/24/2024   Non-restorative sleep 04/24/2024   Snoring 04/24/2024   Fatigue 03/19/2024   Adnexal cyst 08/15/2023   Situational mixed anxiety and depressive disorder 08/15/2023   Pure hypercholesterolemia 08/15/2023   History of bowel infarction 07/31/2022   Family history of melanoma 05/29/2022   Melanocytic nevi of trunk 05/29/2022   Sensorineural hearing loss (SNHL) of right ear with restricted hearing of left ear 05/29/2022   Night terrors 05/29/2022   GERD (gastroesophageal reflux disease) 05/17/2021   Lipoma of left lower extremity 11/16/2017   H/O scoliosis 01/25/2017   Back pain 01/01/2017    PCP: Katheen Roselie [place], Np  REFERRING PROVIDER: Hilma Hastings, PA-C  REFERRING DIAG:  Diagnosis  M43.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  PERTINENT HISTORY: OSA, hearing loss B, GERD, Lumbar fusion (harrington rod in 1980)  WEIGHT BEARING RESTRICTIONS: No  FALLS:  Has patient fallen in last 6 months? Yes. Number of falls 1, walking backwards  and tripped on a curb.   LIVING ENVIRONMENT: Lives with: lives with their family Lives in: House/apartment Stairs: Yes: External: 3 steps; none Has following equipment at home: None  OCCUPATION: Photographer   PRECAUTIONS: None ---------------------------------------------------------------------------------------------  SUBJECTIVE:                                                                                                                                                                              SUBJECTIVE STATEMENT: Pain levels remain unchanged but has been more active w/o increased symptoms.  Has begun a gym program focused on aerobic function in preparation for a cruise in Feb.  RED FLAGS: None    PLOF: Independent  PATIENT GOALS: minimize pain  NEXT MD  VISIT: need to schedule. ---------------------------------------------------------------------------------------------  OBJECTIVE:  Note: Objective measures were completed at Evaluation unless otherwise noted.  DIAGNOSTIC FINDINGS:  IMPRESSION: 1. Enhancing right paraspinal mass measuring up to 5 cm in craniocaudal dimension. This appears stable since prior examination in 2022. 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 2. Moderate neuroforaminal stenosis bilaterally at L3-L4 and on the left at L4-L5. 3. Operative changes of posterior spinal fusion from the thoracic spine through the level of L2.     Electronically Signed   By: Clem Savory M.D.   On: 04/29/2024 14:26    PATIENT SURVEYS:  MODI  COGNITION: Overall cognitive status: Within functional limits for tasks assessed   PALPATION: TTP to lumabr spine around L2 causes most pain  Lumbar contraction pattern  L Multifidus: poor contraction,  R Multifidus:poor contraction   SENSATION: WFL  MUSCLE LENGTH: Hamstrings: Right 10d; deg; Left 10d deg    POSTURE: right pelvic obliquity and flexed trunk    LUMBAR ROM: unable to  tolerate full assessment at eval  AROM eval  Flexion   Extension   Right lateral flexion   Left lateral flexion   Right rotation   Left rotation    (Blank rows = not tested)  ! Indicates pain with testing  LOWER EXTREMITY ROM:   unable to tolerate testing at eval  Active  Right eval Left eval  Hip flexion    Hip extension    Hip abduction    Hip adduction    Hip internal rotation    Hip external rotation    Knee flexion    Knee extension    Ankle dorsiflexion    Ankle plantarflexion    Ankle inversion    Ankle eversion     (Blank rows = not tested)  ! Indicates pain with testing  LOWER EXTREMITY MMT:    MMT Right eval Left eval  Hip flexion 4 4-  Hip extension 4 4  Hip abduction 4 4  Hip adduction    Hip internal rotation    Hip external rotation    Knee flexion 4+ 4  Knee extension 4- 5  Ankle dorsiflexion    Ankle plantarflexion    Ankle inversion    Ankle eversion     (Blank rows = not tested)   ! Indicates pain with testing LUMBAR SPECIAL TESTS:  Prone instability test: Positive  FUNCTIONAL TESTS:  5 times sit to stand: 23s  GAIT: Distance walked: 100' Assistive device utilized: None Level of assistance: Complete Independence Comments: decreased step length, flexed trunk  [facility] Adult PT Treatment:                                                DATE: 08/12/24 Therapeutic Exercise: Nustep L6 8 min 60 SPM Neuromuscular re-ed: Supine hip fallouts BluTB 15x B, 15/15 Dead bug with P-ball 10/10 Heel raise from 4 in step 10x Therapeutic Activity: Seated hamstring stretch 30s x2 B Supine R hip flexor stretch 30s x2 Runners step 4 in 10/10  [facility] Adult PT Treatment:                                                DATE: 07/28/24 Therapeutic Exercise: Laurene  L4 8 min Neuromuscular re-ed: Supine hip fallouts GTB 15x B, 15/15 PPT 3s 10x Supine march 15/15 w/PPT STS from airex pad 10x arms crossed Therapeutic Activity: Seated hamstring stretch 30s  B Supine R hip flexor stretch 30s x2 P-ball curls ups 10x B, 10/10  [facility] Adult PT Treatment:                                                DATE: 07/24/24 Therapeutic Exercise: Nustep L2 6 min Neuromuscular re-ed: Supine hip fallouts GTB 15x B, 15/15 Supine march 15/15 STS from airex pad 10x arms crossed Seated hip toss 10/10 no weight Therapeutic Activity: Supine QL stretch 30s x2 B Supine core via B shoulder flexion YTB 15x Supine R hip flexor stretch 30s x2  [facility] Adult PT Treatment:                                                DATE: 07/17/24  Neuromuscular re-ed: Supine hip fallouts RTB 15x B, 15/15 Supine march RTB 15/15 STS from airex pad 10x arms crossed Therapeutic Activity: Seated hamstring stretch 30s x2 B Supine P-ball press 3s 10x B, 10/10 Supine R hip flexor stretch 30s x2  [facility] Adult PT Treatment:                                                DATE: 06/25/2024 Self Care: POC discussion Pt education                                                                                                                                PATIENT EDUCATION:  Education details: Pt received education regarding HEP performance, ADL performance, functional activity tolerance, impairment education, appropriate performance of therapeutic activities.  Person educated: Patient Education method: Explanation, Demonstration, Tactile cues, Verbal cues, and Handouts Education comprehension: verbalized understanding and returned demonstration  HOME EXERCISE PROGRAM: Access Code: [email or web address] URL: [email or web address] Date: 07/17/2024 Prepared by: Elise Knobloch  Exercises - Diaphragmatic Breathing with Pelvic Tilt in Hooklying  - 1-3 x daily - 7 x weekly - 2 sets - 25 reps - Supine Bridge  - 1 x daily - 4 x weekly - 2-3 sets - 12 reps - 4s hold - Sit to Stand  - 1 x daily - 4 x weekly - 2-3 sets - 10 reps - Supine March  - 1 x daily - 4 x weekly - 3 sets - 15 reps -  Hooklying Single Leg Bent Knee Fallouts with Resistance  - 1 x daily - 5 x weekly -  1 sets - 15 reps - Seated Hamstring Stretch  - 1 x daily - 5 x weekly - 1 sets - 2 reps - 30s hold ---------------------------------------------------------------------------------------------  ASSESSMENT:  CLINICAL IMPRESSION:  Advanced to WB tasks and stepping activity.  Increased resistance and challenge as noted.  Continues to experience symptoms with R hip flexor stretching but able to tolerate runners step.  Eval impression (06/25/2024): Pt. attended today's physical therapy session for evaluation of LBP. Pt has complaints of back pain onset since 1980 that has recently gotten worse below their harrington rod. Pt has notable deficits and would benefit from therapeutic focus on lumbar stability, positional intolerance to prone>supine, proximal hip strength,posture, transfer quality, and general activity tolerance.  Today's treatment focus primarily on pt education detailed in objective. Pt demonstrated good understanding of education provided. required minimal v/t cues and moderate assistance for appropriate performance with today's bed mobility from supine to sit. Pt requires the intervention of skilled outpatient physical therapy to address the aforementioned deficits and progress towards a functional level in line with therapeutic goals.    OBJECTIVE IMPAIRMENTS: Abnormal gait, decreased activity tolerance, decreased mobility, difficulty walking, decreased ROM, decreased strength, impaired perceived functional ability, improper body mechanics, postural dysfunction, and pain.   ACTIVITY LIMITATIONS: carrying, lifting, bending, sitting, standing, squatting, sleeping, transfers, bed mobility, and locomotion level  PARTICIPATION LIMITATIONS: cleaning, laundry, driving, shopping, community activity, and occupation  PERSONAL FACTORS: Age, Fitness, Past/current experiences, and Time since onset of  injury/illness/exacerbation are also affecting patient's functional outcome.   REHAB POTENTIAL: Fair fusion since 1980, moderate degenerative changes along  multilevel lumbar spine  CLINICAL DECISION MAKING: Evolving/moderate complexity  EVALUATION COMPLEXITY: Moderate   GOALS: Goals reviewed with patient? YES  SHORT TERM GOALS: Target date: 07/16/2024    Pt will be independent with administered HEP to demonstrate the competency necessary for long term managemnet of symptoms at home. Baseline: [email or web address] Goal status: Ongoing  LONG TERM GOALS: Target date: 08/06/2024  Pt. Will achieve a MODI score of 9/50 as to demonstrate improvement in self-perceived functional ability with daily activities.  Baseline: 15/50 Goal status: INITIAL  2.  Pt will improve Global hip strength to a 4+/5 to demonstrate improvement in strength for quality of motion and activity performance.  Baseline:  Goal status: INITIAL  3. Pt will report pain levels improving during ADLs to be less than or equal to 3/10 as to demonstrate improved tolerance with daily functional activities such as sitting and bending forward. Baseline: 6/10 Goal status: INITIAL  4.  Pt will improve x5 STS time to 18s to demonstrate improved transfer quality, BLE functional strength, and coordination. Baseline: 23s Goal status: INITIAL ---------------------------------------------------------------------------------------------  PLAN:  PT FREQUENCY: 1-2x/week  PT DURATION: 6 weeks  PLANNED INTERVENTIONS: 97110-Therapeutic exercises, 97530- Therapeutic activity, 97112- Neuromuscular re-education, 97535- Self Care, [id number]- Manual therapy, [phone]- Gait training, [phone]- Aquatic Therapy, [phone] (1-2 muscles), [phone] (3+ muscles)- Dry Needling, Patient/Family education, Balance training, Stair training, Taping, Joint mobilization, Spinal mobilization, Cryotherapy, and Moist heat.  PLAN FOR NEXT SESSION: Review HEP, Begin POC as detailed  in assessment   Chyrl Kohut PT  08/22/2024, 9:09 AM   .

## 2024-08-25 ENCOUNTER — Ambulatory Visit

## 2024-09-04 ENCOUNTER — Other Ambulatory Visit: Payer: Self-pay | Admitting: Nurse Practitioner

## 2024-09-04 DIAGNOSIS — K219 Gastro-esophageal reflux disease without esophagitis: Secondary | ICD-10-CM

## 2024-09-04 NOTE — Telephone Encounter (Signed)
 Medication: Omeprazole  20 mg Directions:Take 1 capsule by mouth daily Last given: 03/03/24 Number refills: 1 Last o/v: 03/19/24 (Acute), 08/15/23 (CPE) Follow up: If Sx worsen, 4 weeks for Anxiety/Depression Labs:

## 2024-09-06 ENCOUNTER — Encounter: Payer: Self-pay | Admitting: Nurse Practitioner

## 2024-09-08 NOTE — Telephone Encounter (Signed)
 Pt inquiring about a titer test for measles vaccine. Please advise

## 2024-09-09 ENCOUNTER — Other Ambulatory Visit: Payer: Self-pay

## 2024-09-09 NOTE — Telephone Encounter (Signed)
 Patient is scheduled for 9:20 am tm for a Lab visit

## 2024-09-10 ENCOUNTER — Other Ambulatory Visit (INDEPENDENT_AMBULATORY_CARE_PROVIDER_SITE_OTHER)

## 2024-09-10 DIAGNOSIS — Z0184 Encounter for antibody response examination: Secondary | ICD-10-CM

## 2024-09-13 LAB — MEASLES/MUMPS/RUBELLA IMMUNITY
Mumps IgG: 182 [AU]/ml
Rubella: 5.41 {index}
Rubeola IgG: 129 [AU]/ml

## 2024-09-14 ENCOUNTER — Ambulatory Visit: Payer: Self-pay | Admitting: Family

## 2024-09-30 ENCOUNTER — Encounter: Payer: Self-pay | Admitting: Nurse Practitioner
